# Patient Record
Sex: Female | Born: 1945 | Race: White | Hispanic: No
Health system: Southern US, Community
[De-identification: ages and names within clinical notes are randomized; demographics above are authoritative.]

## PROBLEM LIST (undated history)

## (undated) DIAGNOSIS — I2510 Atherosclerotic heart disease of native coronary artery without angina pectoris: Secondary | ICD-10-CM

## (undated) DIAGNOSIS — Z8719 Personal history of other diseases of the digestive system: Secondary | ICD-10-CM

## (undated) DIAGNOSIS — Z9289 Personal history of other medical treatment: Secondary | ICD-10-CM

## (undated) DIAGNOSIS — K219 Gastro-esophageal reflux disease without esophagitis: Secondary | ICD-10-CM

## (undated) DIAGNOSIS — D649 Anemia, unspecified: Secondary | ICD-10-CM

## (undated) DIAGNOSIS — I5032 Chronic diastolic (congestive) heart failure: Secondary | ICD-10-CM

## (undated) DIAGNOSIS — E785 Hyperlipidemia, unspecified: Secondary | ICD-10-CM

## (undated) DIAGNOSIS — J42 Unspecified chronic bronchitis: Secondary | ICD-10-CM

## (undated) DIAGNOSIS — M1990 Unspecified osteoarthritis, unspecified site: Secondary | ICD-10-CM

## (undated) DIAGNOSIS — Z952 Presence of prosthetic heart valve: Secondary | ICD-10-CM

## (undated) DIAGNOSIS — J45909 Unspecified asthma, uncomplicated: Secondary | ICD-10-CM

## (undated) DIAGNOSIS — J449 Chronic obstructive pulmonary disease, unspecified: Secondary | ICD-10-CM

## (undated) DIAGNOSIS — I050 Rheumatic mitral stenosis: Secondary | ICD-10-CM

## (undated) DIAGNOSIS — E559 Vitamin D deficiency, unspecified: Secondary | ICD-10-CM

## (undated) DIAGNOSIS — N184 Chronic kidney disease, stage 4 (severe): Secondary | ICD-10-CM

## (undated) DIAGNOSIS — E119 Type 2 diabetes mellitus without complications: Secondary | ICD-10-CM

## (undated) DIAGNOSIS — I10 Essential (primary) hypertension: Secondary | ICD-10-CM

## (undated) DIAGNOSIS — K829 Disease of gallbladder, unspecified: Secondary | ICD-10-CM

## (undated) DIAGNOSIS — I350 Nonrheumatic aortic (valve) stenosis: Secondary | ICD-10-CM

## (undated) HISTORY — DX: Vitamin D deficiency, unspecified: E55.9

## (undated) HISTORY — DX: Disease of gallbladder, unspecified: K82.9

## (undated) HISTORY — PX: BREAST CYST EXCISION: SHX579

## (undated) HISTORY — PX: CATARACT EXTRACTION W/ INTRAOCULAR LENS  IMPLANT, BILATERAL: SHX1307

## (undated) HISTORY — DX: Atherosclerotic heart disease of native coronary artery without angina pectoris: I25.10

## (undated) HISTORY — DX: Unspecified asthma, uncomplicated: J45.909

## (undated) HISTORY — PX: RETINAL LASER PROCEDURE: SHX2339

## (undated) HISTORY — DX: Hyperlipidemia, unspecified: E78.5

## (undated) HISTORY — PX: TONSILLECTOMY: SUR1361

## (undated) HISTORY — DX: Essential (primary) hypertension: I10

## (undated) HISTORY — DX: Nonrheumatic aortic (valve) stenosis: I35.0

## (undated) HISTORY — PX: CARDIAC CATHETERIZATION: SHX172

## (undated) HISTORY — PX: OTHER SURGICAL HISTORY: SHX169

## (undated) HISTORY — PX: PATELLA FRACTURE SURGERY: SHX735

## (undated) HISTORY — PX: FRACTURE SURGERY: SHX138

## (undated) HISTORY — DX: Chronic kidney disease, stage 4 (severe): N18.4

## (undated) HISTORY — DX: Anemia, unspecified: D64.9

---

## 2015-07-15 DIAGNOSIS — R011 Cardiac murmur, unspecified: Secondary | ICD-10-CM

## 2015-07-15 HISTORY — DX: Cardiac murmur, unspecified: R01.1

## 2015-08-27 DIAGNOSIS — N186 End stage renal disease: Secondary | ICD-10-CM | POA: Insufficient documentation

## 2016-10-03 DIAGNOSIS — K8050 Calculus of bile duct without cholangitis or cholecystitis without obstruction: Secondary | ICD-10-CM | POA: Insufficient documentation

## 2016-10-05 DIAGNOSIS — L89152 Pressure ulcer of sacral region, stage 2: Secondary | ICD-10-CM | POA: Insufficient documentation

## 2016-11-24 DIAGNOSIS — E11319 Type 2 diabetes mellitus with unspecified diabetic retinopathy without macular edema: Secondary | ICD-10-CM | POA: Insufficient documentation

## 2016-11-24 DIAGNOSIS — R2681 Unsteadiness on feet: Secondary | ICD-10-CM | POA: Insufficient documentation

## 2016-11-24 HISTORY — DX: Type 2 diabetes mellitus with unspecified diabetic retinopathy without macular edema: E11.319

## 2017-01-07 ENCOUNTER — Other Ambulatory Visit: Payer: Self-pay

## 2017-01-21 ENCOUNTER — Telehealth: Payer: Self-pay | Admitting: Cardiovascular Disease

## 2017-01-21 NOTE — Telephone Encounter (Signed)
New Message  Tammy pt family member called requesting to speak with RN. She states pt is in the hospital in [facility] and does not think the pt will be out by Monday. Tammy would like to further discuss with Lauren. Please call back

## 2017-01-24 ENCOUNTER — Ambulatory Visit: Payer: Self-pay | Admitting: Cardiovascular Disease

## 2017-01-24 NOTE — Telephone Encounter (Signed)
Left message on machine for Tammy to contact the office.

## 2017-01-24 NOTE — Telephone Encounter (Signed)
I spoke with Sonya Carpenter and the pt is hospitalized in [place] at this time. She went into the hospital last Thursday with SOB. The pt has a history of asthma and COPD and x-ray showed fluid vs pneumonia. The pt is being treated with antibiotic and Solu-medrol at this time.  Pulmonary will consult on the pt today.  The pt is using O2 4 L/min.  I have rescheduled the pt for next available TAVR consult on 10/12 with Dr Burt Knack.  Sonya Carpenter will contact the office if this appointment needs to be changed.

## 2017-01-24 NOTE — Progress Notes (Deleted)
Multi-Disciplinary Valve Clinic Note  Referring Provider: ***  History of Present Illness: 71 yo female with history of   Primary Care Physician: No primary care provider on file.  Primary cardiologist: ***  Past Medical History:  Diagnosis Date  . Anemia   . Aortic stenosis   . Aortic valve disorder   . Asthma   . CAD (coronary artery disease)   . CKD (chronic kidney disease), stage IV ([place])   . Edema   . Gallbladder disease   . Heart murmur   . Hyperlipidemia   . Hypertension   . Other seasonal allergic rhinitis   . SOB (shortness of breath)   . Vitamin D deficiency     Past Surgical History:  Procedure Laterality Date  . BREAST CYST EXCISION Right   . COLONSCOPY    . GALLBLADDER SURGERY    . KNEE SURGERY    . REFRACTIVE SURGERY    . TONSILLECTOMY      Current Outpatient Prescriptions  Medication Sig Dispense Refill  . albuterol (2.5 MG/3ML) 0.083% NEBU 3 mL, albuterol (5 MG/ML) 0.5% NEBU 0.5 mL Inhale 1 mg into the lungs every 6 (six) hours.    Marland Kitchen albuterol (PROVENTIL HFA;VENTOLIN HFA) 108 (90 Base) MCG/ACT inhaler Inhale into the lungs every 6 (six) hours as needed for wheezing or shortness of breath.    Marland Kitchen amLODipine (NORVASC) 5 MG tablet Take 5 mg by mouth daily.    Marland Kitchen aspirin EC 81 MG tablet Take 81 mg by mouth daily.    Marland Kitchen atorvastatin (LIPITOR) 40 MG tablet Take 40 mg by mouth daily.    . budesonide-formoterol (SYMBICORT) 160-4.5 MCG/ACT inhaler Inhale 2 puffs into the lungs 2 (two) times daily.    . cetirizine (ZYRTEC) 10 MG tablet Take 10 mg by mouth daily.    Marland Kitchen Dextran 70-Hypromellose (ARTIFICIAL TEARS) 0.1-0.3 % SOLN Apply to eye.    . DiphenhydrAMINE HCl (BENADRYL ALLERGY PO) Take by mouth.    . ferrous sulfate 325 (65 FE) MG EC tablet Take 325 mg by mouth daily.    . insulin lispro (HUMALOG) 100 UNIT/ML injection Inject 100 Units into the skin daily.    Marland Kitchen ipratropium (ATROVENT) 0.02 % nebulizer solution Take 0.5 mg by nebulization 2 (two) times  daily.    Marland Kitchen losartan (COZAAR) 100 MG tablet Take 100 mg by mouth daily.    Marland Kitchen lubiprostone (AMITIZA) 24 MCG capsule Take 24 mcg by mouth 2 (two) times daily with a meal.    . montelukast (SINGULAIR) 10 MG tablet Take 10 mg by mouth at bedtime.    . traMADol (ULTRAM) 50 MG tablet Take 50 mg by mouth every 6 (six) hours as needed.     No current facility-administered medications for this visit.     No Known Allergies  Social History   Social History  . Marital status: N/A    Spouse name: N/A  . Number of children: N/A  . Years of education: N/A   Occupational History  . Not on file.   Social History Main Topics  . Smoking status: Never Smoker  . Smokeless tobacco: Never Used  . Alcohol use Not on file  . Drug use: Unknown  . Sexual activity: Not on file   Other Topics Concern  . Not on file   Social History Narrative  . No narrative on file    Family History  Problem Relation Age of Onset  . Hypertension Mother   . Kidney failure Mother   .  Heart disease Mother   . Hypertension Father   . Heart disease Father   . Cancer Sister     Review of Systems:  As stated in the HPI and otherwise negative.   There were no vitals taken for this visit.  Physical Examination: General: Well developed, well nourished, NAD  HEENT: OP clear, mucus membranes moist  SKIN: warm, dry. No rashes. Neuro: No focal deficits  Musculoskeletal: Muscle strength 5/5 all ext  Psychiatric: Mood and affect normal  Neck: No JVD, no carotid bruits, no thyromegaly, no lymphadenopathy.  Lungs:Clear bilaterally, no wheezes, rhonci, crackles Cardiovascular: Regular rate and rhythm. *** Loud, harsh late peaking systolic murmur.  Abdomen:Soft. Bowel sounds present. Non-tender.  Extremities: No lower extremity edema. Pulses are 2 + in the bilateral DP/PT.  EKG:  EKG {ACTION; IS/IS GBE:01007121} ordered today. The ekg ordered today demonstrates ***  Recent Labs: No results found for requested labs  within last 8760 hours.   Lipid Panel No results found for: CHOL, TRIG, HDL, CHOLHDL, VLDL, LDLCALC, LDLDIRECT   Wt Readings from Last 3 Encounters:  No data found for Wt     Other studies Reviewed: Additional studies/ records that were reviewed today include: ***. Review of the above records demonstrates: ***  Assessment and Plan:   1. Severe aortic valve stenosis: *** has stage D symptomatic aortic valve stenosis. I have personally reviewed the echo images. The aortic valve is thickened, calcified with limited leaflet mobility. I think *** would benefit from AVR. Given advanced age, *** is not a good candidate for conventional AVR by surgical approach. I think *** may be a good candidate for TAVR. *** is a very functional *** yo patient. I have reviewed the TAVR procedure in detail today with the patient and her family. *** would like to proceed with planning for TAVR. I will arrange a right and left heart catheterization at [facility] ***. Risks and benefits of procedure reviewed with the patient. After the cath, *** will be referred to see one of the CT surgeons on our TAVR team. *** will then have Cardiac CT and CTA of the chest/abd/pelvis.   Current medicines are reviewed at length with the patient today.  The patient {ACTIONS; HAS/DOES NOT HAVE:19233} concerns regarding medicines.  The following changes have been made:  {PLAN; NO CHANGE:13088:s}  Labs/ tests ordered today include: *** No orders of the defined types were placed in this encounter.    Disposition:   FU with *** in {gen number [id number]} {TIME; UNITS DAY/WEEK/MONTH:19136}   Signed, Lauree Chandler, MD 01/24/2017 7:09 AM    [facility] [place], [place], [place]  [address] Phone: [phone]; Fax: [phone]

## 2017-02-04 ENCOUNTER — Ambulatory Visit (INDEPENDENT_AMBULATORY_CARE_PROVIDER_SITE_OTHER): Payer: Medicare HMO | Admitting: Cardiovascular Disease

## 2017-02-04 ENCOUNTER — Encounter: Payer: Self-pay | Admitting: Cardiovascular Disease

## 2017-02-04 ENCOUNTER — Encounter (INDEPENDENT_AMBULATORY_CARE_PROVIDER_SITE_OTHER): Payer: Self-pay

## 2017-02-04 VITALS — BP 136/60 | HR 72 | Ht <= 58 in | Wt 110.0 lb

## 2017-02-04 DIAGNOSIS — I5033 Acute on chronic diastolic (congestive) heart failure: Secondary | ICD-10-CM | POA: Diagnosis not present

## 2017-02-04 DIAGNOSIS — I350 Nonrheumatic aortic (valve) stenosis: Secondary | ICD-10-CM | POA: Diagnosis not present

## 2017-02-04 MED ORDER — FUROSEMIDE 40 MG PO TABS: 40.0000 mg | ORAL_TABLET | Freq: Every day | ORAL | 3 refills | Status: DC

## 2017-02-04 MED ORDER — POTASSIUM CHLORIDE ER 10 MEQ PO TBCR: 10.0000 meq | EXTENDED_RELEASE_TABLET | Freq: Every day | ORAL | 3 refills | Status: DC

## 2017-02-04 NOTE — Progress Notes (Signed)
Cardiology Office Note Date:  02/06/2017   ID:  Sonya Carpenter [birth year], MRN [id number]  PCP:  Sonya Heinrich, DO  Cardiologist:  Sonya Mocha, MD    Chief Complaint  Patient presents with  . New Patient (Initial Visit)    TAVR consult     History of Present Illness: Sonya Carpenter is a 71 y.o. female who presents for evaluation of severe aortic stenosis.   The patient is here with her first cousin and uncle today. She lives in [place], [place], widowed for one year. She has been followed by Dr Sonya Carpenter who is her cardiologist in [place]. She missed a few years of follow-up and was recently seen for pre-operative evaluation for gall bladder surgery.   Even though moderate aortic stenosis has been diagnosed in the past based on review of records, the patient doesn't recall any discussion about a murmur in the past.   She has had several medical problems over the years. She's been followed for chronic kidney disease stage 4. She's been to classes explaining dialysis in case her kidney disease progresses in the future. She does have a history of asthma since age 78. She's been hospitalized twice recently with pneumonia. Just released from the hospital last week. Reports that she had a right thoracentesis done during this admission. States she has had her 'lungs cleaned out' several times with what sounds like bronchoscopy based on her description. Does not recall receiving IV diuretics and in fact was told she was dehydrated during recent admissions.   She has previously been active. 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 She's not been able to drive a car or work since June of this year because of her health problems. She's become increasingly short of breath over the last 4 months. Now has to be dropped off at the front door when she goes anywhere because of shortness of breath with low level activity.   The patient has had diabetes for approximately 10 years  with both nephropathy and diabetic retinopathy. She has not had problems with neuropathy.   Past Medical History:  Diagnosis Date  . Anemia   . Aortic stenosis   . Aortic valve disorder   . Asthma   . CAD (coronary artery disease)   . CKD (chronic kidney disease), stage IV ([place])   . Edema   . Gallbladder disease   . Heart murmur   . Hyperlipidemia   . Hypertension   . Other seasonal allergic rhinitis   . SOB (shortness of breath)   . Vitamin D deficiency     Past Surgical History:  Procedure Laterality Date  . BREAST CYST EXCISION Right   . COLONSCOPY    . GALLBLADDER SURGERY    . KNEE SURGERY    . REFRACTIVE SURGERY    . TONSILLECTOMY      Current Outpatient Prescriptions  Medication Sig Dispense Refill  . albuterol (2.5 MG/3ML) 0.083% NEBU 3 mL, albuterol (5 MG/ML) 0.5% NEBU 0.5 mL Inhale 1 mg into the lungs every 6 (six) hours.    Marland Kitchen albuterol (PROVENTIL HFA;VENTOLIN HFA) 108 (90 Base) MCG/ACT inhaler Inhale into the lungs every 6 (six) hours as needed for wheezing or shortness of breath.    Marland Kitchen amLODipine (NORVASC) 5 MG tablet Take 5 mg by mouth daily.    Marland Kitchen aspirin EC 81 MG tablet Take 81 mg by mouth daily.    Marland Kitchen atorvastatin (LIPITOR) 20 MG tablet Take 20 mg by mouth daily.    Marland Kitchen  budesonide-formoterol (SYMBICORT) 160-4.5 MCG/ACT inhaler Inhale 2 puffs into the lungs 2 (two) times daily.    . cetirizine (ZYRTEC) 10 MG tablet Take 10 mg by mouth daily.    Marland Kitchen Dextran 70-Hypromellose (ARTIFICIAL TEARS) 0.1-0.3 % SOLN Place 1 drop into both eyes 2 (two) times daily.     . ferrous sulfate 325 (65 FE) MG EC tablet Take 325 mg by mouth 2 (two) times daily.     Marland Kitchen ipratropium (ATROVENT) 0.02 % nebulizer solution Take 0.5 mg by nebulization 2 (two) times daily.    Marland Kitchen LANTUS SOLOSTAR 100 UNIT/ML Solostar Pen Inject 20 Units into the skin daily.    Marland Kitchen losartan (COZAAR) 100 MG tablet Take 50 mg by mouth daily.     Marland Kitchen lubiprostone (AMITIZA) 8 MCG capsule Take 8 mcg by mouth daily.    .  montelukast (SINGULAIR) 10 MG tablet Take 10 mg by mouth at bedtime.    . sodium chloride (OCEAN) 0.65 % SOLN nasal spray Place 1 spray into both nostrils 2 (two) times daily.    . traMADol (ULTRAM) 50 MG tablet Take 50 mg by mouth every 6 (six) hours as needed (pain).     . furosemide (LASIX) 40 MG tablet Take 1 tablet (40 mg total) by mouth daily. 30 tablet 3  . potassium chloride (K-DUR) 10 MEQ tablet Take 1 tablet (10 mEq total) by mouth daily. 30 tablet 3   No current facility-administered medications for this visit.     Allergies:   No known allergies   Social History:  The patient  reports that she has never smoked. She has never used smokeless tobacco.   Family History:  The patient's family history includes Cancer in her sister; Heart disease in her father and mother; Hypertension in her father and mother; Kidney failure in her mother.   ROS:  Please see the history of present illness.  Otherwise, review of systems is positive for .  All other systems are reviewed and negative.   PHYSICAL EXAM: VS:  BP 136/60   Pulse 72   Ht 4\' 10"  (1.473 m)   Wt 110 lb (49.9 kg)   BMI 22.99 kg/m  , BMI Body mass index is 22.99 kg/m. GEN: Well nourished, well developed, elderly woman in no acute distress  HEENT: normal  Neck: no JVD, no masses. Delayed upstrokes with  carotid bruits Cardiac: RRR with 3/6 harsh late-peaking systolic murmur               Respiratory:  clear to auscultation bilaterally, normal work of breathing GI: soft, nontender, nondistended, + BS MS: no deformity or atrophy  Ext: 2+ bilateral pretibial edema Skin: warm and dry, no rash Neuro:  Strength and sensation are intact Psych: euthymic mood, full affect  EKG:  EKG is ordered today. The ekg ordered today shows Normal sinus rhythm 72 bpm, ST and T-wave abnormality consider inferior ischemia.  Recent Labs: 02/04/2017: BUN WILL FOLLOW; Creatinine, Ser WILL FOLLOW; Hemoglobin 12.7; Platelets 117; Potassium WILL  FOLLOW; Sodium WILL FOLLOW   Lipid Panel  No results found for: CHOL, TRIG, HDL, CHOLHDL, VLDL, LDLCALC, LDLDIRECT    Wt Readings from Last 3 Encounters:  02/04/17 110 lb (49.9 kg)     Cardiac Studies Reviewed: Echo report 12/08/2016: LVEF 60-65% with no wall motion abnormalities, concentric LVH with grade 2 diastolic dysfunction, severely calcified aortic valve with decreased cusp separation, severe aortic stenosis with mean gradient 69 L mercury and peak gradient 121 mmHg, calcification of  the mitral annulus with mild to moderate mitral stenosis.  STS RISK CALCULATOR Procedure: AV Replacement  Risk of Mortality: 15.185%  Morbidity or Mortality: 45.978%  Long Length of Stay: 26.977%  Short Length of Stay: 11.811%  Permanent Stroke: 2.284%  Prolonged Ventilation: 36.272%  DSW Infection: 0.206%  Renal Failure: 19.718%  Reoperation: 15.16%   ASSESSMENT AND PLAN: Severe, Stage D, Aortic stenosis. I have reviewed the natural history of aortic stenosis with the patient and their family members who are present today. We have discussed the limitations of medical therapy and the poor prognosis associated with symptomatic aortic stenosis. We have reviewed potential treatment options, including palliative medical therapy, conventional surgical aortic valve replacement, and transcatheter aortic valve replacement. We discussed treatment options in the context of the patient's specific comorbid medical conditions. Her predicted risk of mortality with conventional surgical AVR is 15% based primarily on comorbid medical conditions of Stage 4 CKD, insulin dependent diabetes, and moderate chronic lung disease.  I have personally reviewed the patient's echo images which of been loaded onto our system. She has vigorous LV function and heavy calcification of her aortic valve with what appears to be critical stenosis based on her hemodynamics. She does have calcification and thickening of the mitral valve  leaflets with moderate mitral stenosis as well.  I suspect the patient has critical aortic stenosis and acute on chronic diastolic heart failure likely playing a significant role in her recent hospitalizations and progressive dyspnea. Complicating matters are her relatively advanced kidney disease and diabetes. I think TAVR is certainly a reasonable treatment consideration that would offer her a lower risk of progressive kidney dysfunction and dialysis dependence compared with conventional heart surgery. She understands that without aortic valve replacement her symptoms of heart failure will likely progress. However, she understands that proceeding with TAVR and it's necessary workup will still involve significant risk in the context of her compromised renal function. She would like to proceed with further evaluation. We will plan on a limited dye diagnostic cardiac catheterization (right and left heart catheterization). I have reviewed the risks, indications, and alternatives to cardiac catheterization, possible angioplasty, and stenting with the patient. Risks include but are not limited to bleeding, infection, vascular injury, stroke, myocardial infection, arrhythmia, kidney injury, radiation-related injury in the case of prolonged fluoroscopy use, emergency cardiac surgery, and death. The patient understands the risks of serious complication is 1-2 in 5956 with diagnostic cardiac cath and 1-2% or less with angioplasty/stenting.  As long as she does not have severe coronary artery disease, would anticipate moving forward with CT angiography of the heart as well as the chest/abdomen/pelvis. Will use a limited contrast protocol in the context of her stage IV chronic kidney disease. After her studies are completed we'll refer her to cardiac surgery as part of a multidisciplinary approach to her evaluation.  On exam today, she has significant edema in her legs. I think she has volume overload related to diastolic  heart failure and severe aortic stenosis. Will start her on furosemide 40 mg daily and K-Dur 10 mEq daily. She will be scheduled for her catheterization next week and medications can be adjusted if needed at that time based on assessment of her hemodynamics.  Current medicines are reviewed with the patient today.  The patient does not have concerns regarding medicines.  Labs/ tests ordered today include:   Orders Placed This Encounter  Procedures  . Basic Metabolic Panel (BMET)  . CBC  . INR/PT   Disposition:  FU pending test results  Signed, Sonya Mocha, MD  02/06/2017 4:44 PM    [facility] [place], [place], [place]  [address] Phone: [phone]; Fax: [phone]

## 2017-02-04 NOTE — Patient Instructions (Addendum)
Medication Instructions:  Your physician has recommended you make the following change in your medication:  1. START Potassium Chloride 80mEq take one tablet by mouth daily 2. START Furosemide 40mg  take one tablet by mouth daily  Labwork: Your physician recommends that you have lab work today: BMP, CBC and PT/INR  Testing/Procedures: Your physician has requested that you have a cardiac catheterization. Cardiac catheterization is used to diagnose and/or treat various heart conditions. Doctors may recommend this procedure for a number of different reasons. The most common reason is to evaluate chest pain. Chest pain can be a symptom of coronary artery disease (CAD), and cardiac catheterization can show whether plaque is narrowing or blocking your heart's arteries. This procedure is also used to evaluate the valves, as well as measure the blood flow and oxygen levels in different parts of your heart. For further information please visit HugeFiesta.tn. Please follow instruction sheet, as given.    [facility] [address], [place] [address] Dept: [place]: [place]  02/04/2017  You are scheduled for a Cardiac Catheterization on Wednesday, October 17 with Dr. Lauree Chandler.  1. Please arrive at the [facility] (Main Entrance A) at [facility]: [address] [place], [place] [address] at 11:00 AM (two hours before your procedure to ensure your preparation). Free valet parking service is available.   Special note: Every effort is made to have your procedure done on time. Please understand that emergencies sometimes delay scheduled procedures.  2. Diet: Do not eat or drink anything after midnight prior to your procedure except sips of water to take medications.  3. Labs: None needed.  4. Medication instructions in preparation for your  procedure:  Do NOT take Insulin and Furosemide (Lasix) the morning of procedure.  On the morning of your procedure, take your Aspirin and any morning medicines NOT listed above.  You may use sips of water.  5. Plan for one night stay--bring personal belongings.  6. Bring a current list of your medications and current insurance cards.  7. You MUST have a responsible person to drive you home.  8. Someone MUST be with you the first 24 hours after you arrive home or your discharge will be delayed.  9. Please wear clothes that are easy to get on and off and wear slip-on shoes.  Thank you for allowing [place] to care for you!   -- [facility]      Your physician has requested that you have cardiac CT. Cardiac computed tomography (CT) is a painless test that uses an x-ray machine to take clear, detailed pictures of your heart. For further information please visit HugeFiesta.tn. Please follow instruction sheet as given.   Non-Cardiac CT Angiography (CTA), is a special type of CT scan that uses a computer to produce multi-dimensional views of major blood vessels throughout the body. In CT angiography, a contrast material is injected through an IV to help visualize the blood vessels (CTA Chest/Abdomen and Pelvis)  Pre-CT instructions: 1. No solid foods, No caffeine or smoking 4 hours prior to CT. You can have liquids but no caffeine. 2. No herbal supplements by mouth 24 hours prior to CT. 3. No sexual enhancement drugs/herbs 72 hours prior to CT.   Your physician has recommended that you have a pulmonary function test. Pulmonary Function Tests are a group of tests that measure how well air moves in  and out of your lungs. DO NOT use inhalers the day of test.   Your physician has requested that you have a carotid duplex. This test is an ultrasound of the carotid arteries in your neck. It looks at blood flow through these arteries that supply the brain with  blood. Allow one hour for this exam. There are no restrictions or special instructions.  Your physician has requested Outpatient Physical Therapy evaluation.  This will be done at [facility] at [place], [place] [address]  Follow-Up: You have been referred to Dr Roxy Manns and Dr Cyndia Bent at [facility] for further TAVR evaluation.  [place], [id number], [place],  [id number]   Any Other Special Instructions Will Be Listed Below (If Applicable).     If you need a refill on your cardiac medications before your next appointment, please call your pharmacy.

## 2017-02-07 ENCOUNTER — Other Ambulatory Visit: Payer: Self-pay

## 2017-02-07 ENCOUNTER — Telehealth: Payer: Self-pay | Admitting: Cardiovascular Disease

## 2017-02-07 DIAGNOSIS — I350 Nonrheumatic aortic (valve) stenosis: Secondary | ICD-10-CM

## 2017-02-07 NOTE — Telephone Encounter (Signed)
Records received From [facility]. Placed In Dr.Cooper Doc Box.

## 2017-02-08 ENCOUNTER — Telehealth: Payer: Self-pay

## 2017-02-08 LAB — BASIC METABOLIC PANEL
BUN/Creatinine Ratio: 32 — ABNORMAL HIGH (ref 12–28)
BUN: 51 mg/dL — AB (ref 8–27)
CALCIUM: 9.5 mg/dL (ref 8.7–10.3)
CHLORIDE: 106 mmol/L (ref 96–106)
CO2: 21 mmol/L (ref 20–29)
Creatinine, Ser: 1.59 mg/dL — ABNORMAL HIGH (ref 0.57–1.00)
GFR calc non Af Amer: 33 mL/min/{1.73_m2} — ABNORMAL LOW (ref >59)
GFR, EST AFRICAN AMERICAN: 38 mL/min/{1.73_m2} — AB (ref >59)
Glucose: 271 mg/dL — ABNORMAL HIGH (ref 65–99)
POTASSIUM: 4.5 mmol/L (ref 3.5–5.2)
Sodium: 142 mmol/L (ref 134–144)

## 2017-02-08 LAB — CBC
HEMATOCRIT: 39.6 % (ref 34.0–46.6)
HEMOGLOBIN: 12.7 g/dL (ref 11.1–15.9)
MCH: 29.5 pg (ref 26.6–33.0)
MCHC: 32.1 g/dL (ref 31.5–35.7)
MCV: 92 fL (ref 79–97)
Platelets: 117 10*3/uL — ABNORMAL LOW (ref 150–379)
RBC: 4.3 x10E6/uL (ref 3.77–5.28)
RDW: 14.8 % (ref 12.3–15.4)
WBC: 6.8 10*3/uL (ref 3.4–10.8)

## 2017-02-08 LAB — PROTIME-INR
INR: 1.1 (ref 0.8–1.2)
PROTHROMBIN TIME: 11.4 s (ref 9.1–12.0)

## 2017-02-08 NOTE — Telephone Encounter (Signed)
Patient emergency contact contacted pre-catheterization at [facility] scheduled for:  02/09/2017 @ 1300 Verified arrival time and place:  NT @ 1100 Confirmed AM meds to be taken pre-cath with sip of water: Take ASA Hold lasix, insulin Confirmed patient has responsible person to drive home post procedure and observe patient for 24 hours:  yes Addl concerns:   Pt with stage 4 CKD, Cr drawn on Friday 1.59.   Will notify interventionalist and follow.

## 2017-02-08 NOTE — Telephone Encounter (Signed)
Per Dr. Burt Knack, does not want Pt to have extra fluids d/t recent hospitalization for CHF.  Notified Tammy to arrive as scheduled, will repeat BMP on arrival and Dr. Angelena Form will manage Pt from there.  Tammy indicates understanding, thanked for calling back.  Entered BMP to be released upon arrival in short stay.

## 2017-02-09 ENCOUNTER — Encounter (HOSPITAL_COMMUNITY)
Admission: RE | Disposition: A | Discharge status: Home / Self Care | Payer: Self-pay | Source: Ambulatory Visit | Attending: Cardiovascular Disease

## 2017-02-09 ENCOUNTER — Ambulatory Visit (HOSPITAL_COMMUNITY)
Admission: RE | Admit: 2017-02-09 | Discharge: 2017-02-09 | Disposition: A | Discharge status: Home / Self Care | Payer: Medicare HMO | Source: Ambulatory Visit | Attending: Cardiovascular Disease | Admitting: Cardiovascular Disease

## 2017-02-09 ENCOUNTER — Other Ambulatory Visit: Payer: Self-pay

## 2017-02-09 DIAGNOSIS — D631 Anemia in chronic kidney disease: Secondary | ICD-10-CM | POA: Insufficient documentation

## 2017-02-09 DIAGNOSIS — N184 Chronic kidney disease, stage 4 (severe): Secondary | ICD-10-CM | POA: Diagnosis not present

## 2017-02-09 DIAGNOSIS — I350 Nonrheumatic aortic (valve) stenosis: Secondary | ICD-10-CM

## 2017-02-09 DIAGNOSIS — E1121 Type 2 diabetes mellitus with diabetic nephropathy: Secondary | ICD-10-CM | POA: Insufficient documentation

## 2017-02-09 DIAGNOSIS — E1122 Type 2 diabetes mellitus with diabetic chronic kidney disease: Secondary | ICD-10-CM | POA: Diagnosis not present

## 2017-02-09 DIAGNOSIS — Z841 Family history of disorders of kidney and ureter: Secondary | ICD-10-CM | POA: Insufficient documentation

## 2017-02-09 DIAGNOSIS — I2510 Atherosclerotic heart disease of native coronary artery without angina pectoris: Secondary | ICD-10-CM | POA: Diagnosis not present

## 2017-02-09 DIAGNOSIS — Z794 Long term (current) use of insulin: Secondary | ICD-10-CM | POA: Diagnosis not present

## 2017-02-09 DIAGNOSIS — Z8249 Family history of ischemic heart disease and other diseases of the circulatory system: Secondary | ICD-10-CM | POA: Insufficient documentation

## 2017-02-09 DIAGNOSIS — J45909 Unspecified asthma, uncomplicated: Secondary | ICD-10-CM | POA: Diagnosis not present

## 2017-02-09 DIAGNOSIS — I129 Hypertensive chronic kidney disease with stage 1 through stage 4 chronic kidney disease, or unspecified chronic kidney disease: Secondary | ICD-10-CM | POA: Diagnosis not present

## 2017-02-09 DIAGNOSIS — E785 Hyperlipidemia, unspecified: Secondary | ICD-10-CM | POA: Insufficient documentation

## 2017-02-09 DIAGNOSIS — E559 Vitamin D deficiency, unspecified: Secondary | ICD-10-CM | POA: Diagnosis not present

## 2017-02-09 DIAGNOSIS — Z7982 Long term (current) use of aspirin: Secondary | ICD-10-CM | POA: Insufficient documentation

## 2017-02-09 DIAGNOSIS — E11319 Type 2 diabetes mellitus with unspecified diabetic retinopathy without macular edema: Secondary | ICD-10-CM | POA: Diagnosis not present

## 2017-02-09 DIAGNOSIS — Z9049 Acquired absence of other specified parts of digestive tract: Secondary | ICD-10-CM | POA: Insufficient documentation

## 2017-02-09 DIAGNOSIS — I080 Rheumatic disorders of both mitral and aortic valves: Secondary | ICD-10-CM | POA: Diagnosis not present

## 2017-02-09 DIAGNOSIS — Z809 Family history of malignant neoplasm, unspecified: Secondary | ICD-10-CM | POA: Diagnosis not present

## 2017-02-09 HISTORY — PX: RIGHT/LEFT HEART CATH AND CORONARY ANGIOGRAPHY: CATH118266

## 2017-02-09 LAB — GLUCOSE, CAPILLARY: Glucose-Capillary: 143 mg/dL — ABNORMAL HIGH (ref 65–99)

## 2017-02-09 LAB — BASIC METABOLIC PANEL
ANION GAP: 7 (ref 5–15)
BUN: 49 mg/dL — ABNORMAL HIGH (ref 6–20)
CALCIUM: 9.1 mg/dL (ref 8.9–10.3)
CHLORIDE: 107 mmol/L (ref 101–111)
CO2: 24 mmol/L (ref 22–32)
Creatinine, Ser: 1.86 mg/dL — ABNORMAL HIGH (ref 0.44–1.00)
GFR calc non Af Amer: 26 mL/min — ABNORMAL LOW (ref >60)
GFR, EST AFRICAN AMERICAN: 31 mL/min — AB (ref >60)
GLUCOSE: 149 mg/dL — AB (ref 65–99)
POTASSIUM: 3.9 mmol/L (ref 3.5–5.1)
Sodium: 138 mmol/L (ref 135–145)

## 2017-02-09 LAB — POCT I-STAT 3, VENOUS BLOOD GAS (G3P V)
Acid-base deficit: 1 mmol/L (ref 0.0–2.0)
Bicarbonate: 24.5 mmol/L (ref 20.0–28.0)
O2 SAT: 78 %
PCO2 VEN: 41.4 mmHg — AB (ref 44.0–60.0)
PO2 VEN: 43 mmHg (ref 32.0–45.0)
TCO2: 26 mmol/L (ref 22–32)
pH, Ven: 7.379 (ref 7.250–7.430)

## 2017-02-09 LAB — POCT I-STAT 3, ART BLOOD GAS (G3+)
Bicarbonate: 24.8 mmol/L (ref 20.0–28.0)
O2 Saturation: 99 %
PCO2 ART: 38.4 mmHg (ref 32.0–48.0)
PO2 ART: 132 mmHg — AB (ref 83.0–108.0)
TCO2: 26 mmol/L (ref 22–32)
pH, Arterial: 7.419 (ref 7.350–7.450)

## 2017-02-09 SURGERY — RIGHT/LEFT HEART CATH AND CORONARY ANGIOGRAPHY
Anesthesia: LOCAL

## 2017-02-09 MED ORDER — SODIUM CHLORIDE 0.9% FLUSH: 3.0000 mL | Freq: Two times a day (BID) | INTRAVENOUS | Status: DC

## 2017-02-09 MED ORDER — HEPARIN SODIUM (PORCINE) 1000 UNIT/ML IJ SOLN
INTRAMUSCULAR | Status: AC
  Filled 2017-02-09: qty 1

## 2017-02-09 MED ORDER — FENTANYL CITRATE (PF) 100 MCG/2ML IJ SOLN
INTRAMUSCULAR | Status: DC | PRN
  Administered 2017-02-09: 25 ug via INTRAVENOUS

## 2017-02-09 MED ORDER — HEPARIN (PORCINE) IN NACL 2-0.9 UNIT/ML-% IJ SOLN
INTRAMUSCULAR | Status: AC | PRN
  Administered 2017-02-09: 1000 mL

## 2017-02-09 MED ORDER — LIDOCAINE HCL 2 % IJ SOLN
INTRAMUSCULAR | Status: AC
  Filled 2017-02-09: qty 10

## 2017-02-09 MED ORDER — LIDOCAINE HCL (PF) 1 % IJ SOLN
INTRAMUSCULAR | Status: DC | PRN
  Administered 2017-02-09 (×2): 2 mL via SUBCUTANEOUS

## 2017-02-09 MED ORDER — SODIUM CHLORIDE 0.9% FLUSH: 3.0000 mL | INTRAVENOUS | Status: DC | PRN

## 2017-02-09 MED ORDER — SODIUM CHLORIDE 0.9 % IV SOLN: 250.0000 mL | INTRAVENOUS | Status: DC | PRN

## 2017-02-09 MED ORDER — SODIUM CHLORIDE 0.9 % WEIGHT BASED INFUSION: 1.0000 mL/kg/h | INTRAVENOUS | Status: DC

## 2017-02-09 MED ORDER — IOPAMIDOL (ISOVUE-370) INJECTION 76%
INTRAVENOUS | Status: DC | PRN
  Administered 2017-02-09: 60 mL via INTRA_ARTERIAL

## 2017-02-09 MED ORDER — IOPAMIDOL (ISOVUE-370) INJECTION 76%
INTRAVENOUS | Status: AC
  Filled 2017-02-09: qty 100

## 2017-02-09 MED ORDER — FENTANYL CITRATE (PF) 100 MCG/2ML IJ SOLN
INTRAMUSCULAR | Status: AC
  Filled 2017-02-09: qty 2

## 2017-02-09 MED ORDER — SODIUM CHLORIDE 0.9 % WEIGHT BASED INFUSION
3.0000 mL/kg/h | INTRAVENOUS | Status: AC
  Administered 2017-02-09: 3 mL/kg/h via INTRAVENOUS

## 2017-02-09 MED ORDER — MIDAZOLAM HCL 2 MG/2ML IJ SOLN
INTRAMUSCULAR | Status: AC
  Filled 2017-02-09: qty 2

## 2017-02-09 MED ORDER — VERAPAMIL HCL 2.5 MG/ML IV SOLN
INTRAVENOUS | Status: DC | PRN
  Administered 2017-02-09: 10 mL via INTRA_ARTERIAL

## 2017-02-09 MED ORDER — ASPIRIN 81 MG PO CHEW: 81.0000 mg | CHEWABLE_TABLET | ORAL | Status: DC

## 2017-02-09 MED ORDER — VERAPAMIL HCL 2.5 MG/ML IV SOLN
INTRAVENOUS | Status: AC
  Filled 2017-02-09: qty 2

## 2017-02-09 MED ORDER — MIDAZOLAM HCL 2 MG/2ML IJ SOLN
INTRAMUSCULAR | Status: DC | PRN
  Administered 2017-02-09: 1 mg via INTRAVENOUS

## 2017-02-09 MED ORDER — HEPARIN (PORCINE) IN NACL 2-0.9 UNIT/ML-% IJ SOLN
INTRAMUSCULAR | Status: AC
  Filled 2017-02-09: qty 1000

## 2017-02-09 MED ORDER — SODIUM CHLORIDE 0.9 % IV SOLN: INTRAVENOUS | Status: DC

## 2017-02-09 MED ORDER — HEPARIN SODIUM (PORCINE) 1000 UNIT/ML IJ SOLN
INTRAMUSCULAR | Status: DC | PRN
  Administered 2017-02-09: 3000 [IU] via INTRAVENOUS

## 2017-02-09 SURGICAL SUPPLY — 14 items
CATH BALLN WEDGE 5F 110CM (CATHETERS) ×2 IMPLANT
CATH IMPULSE 5F ANG/FL3.5 (CATHETERS) ×2 IMPLANT
CATH INFINITI 5FR AL1 (CATHETERS) ×2 IMPLANT
DEVICE RAD COMP TR BAND LRG (VASCULAR PRODUCTS) ×2 IMPLANT
GLIDESHEATH SLEND SS 6F .021 (SHEATH) ×2 IMPLANT
GUIDEWIRE INQWIRE 1.5J.035X260 (WIRE) ×1 IMPLANT
INQWIRE 1.5J .035X260CM (WIRE) ×2
KIT HEART LEFT (KITS) ×2 IMPLANT
PACK CARDIAC CATHETERIZATION (CUSTOM PROCEDURE TRAY) ×2 IMPLANT
SHEATH GLIDE SLENDER 4/5FR (SHEATH) ×2 IMPLANT
TRANSDUCER W/STOPCOCK (MISCELLANEOUS) ×2 IMPLANT
TUBING CIL FLEX 10 FLL-RA (TUBING) ×2 IMPLANT
WIRE EMERALD ST .035X150CM (WIRE) ×2 IMPLANT
WIRE HI TORQ VERSACORE-J 145CM (WIRE) ×2 IMPLANT

## 2017-02-09 NOTE — Interval H&P Note (Signed)
History and Physical Interval Note:  02/09/2017 11:57 AM  Sonya Carpenter  has presented today for cardiac cath with the diagnosis of severe aortic stenosis  The various methods of treatment have been discussed with the patient and family. After consideration of risks, benefits and other options for treatment, the patient has consented to  Procedure(s): RIGHT/LEFT HEART CATH AND CORONARY ANGIOGRAPHY (N/A) as a surgical intervention .  The patient's history has been reviewed, patient examined, no change in status, stable for surgery.  I have reviewed the patient's chart and labs.  Questions were answered to the patient's satisfaction.    Cath Lab Visit (complete for each Cath Lab visit)  Clinical Evaluation Leading to the Procedure:   ACS: No.  Non-ACS:    Anginal Classification: CCS II  Anti-ischemic medical therapy: Minimal Therapy (1 class of medications)  Non-Invasive Test Results: No non-invasive testing performed  Prior CABG: No previous CABG         Lauree Chandler

## 2017-02-09 NOTE — Progress Notes (Signed)
Site area: rt ac venous sheath Site Prior to Removal:  Level 0 Pressure Applied For:  10 minutes Manual:   yes Patient Status During Pull:  stable Post Pull Site:  Level  0 Post Pull Instructions Given:  yes Post Pull Pulses Present: palpable radial Dressing Applied:  Gauze and tegaderm Bedrest begins @ 7680 Comments:

## 2017-02-09 NOTE — Discharge Instructions (Signed)

## 2017-02-09 NOTE — H&P (View-Only) (Signed)
Cardiology Office Note Date:  02/06/2017   ID:  Sonya Carpenter, Sonya Carpenter [birth year], MRN [id number]  PCP:  Michell Heinrich, DO  Cardiologist:  [name], MD    Chief Complaint  Patient presents with  . New Patient (Initial Visit)    TAVR consult     History of Present Illness: Sonya Carpenter is a 71 y.o. female who presents for evaluation of severe aortic stenosis.   The patient is here with her first cousin and uncle today. She lives in [place], [place], widowed for one year. She has been followed by Dr Alroy Dust who is her cardiologist in [place]. She missed a few years of follow-up and was recently seen for pre-operative evaluation for gall bladder surgery.   Even though moderate aortic stenosis has been diagnosed in the past based on review of records, the patient doesn't recall any discussion about a murmur in the past.   She has had several medical problems over the years. She's been followed for chronic kidney disease stage 4. She's been to classes explaining dialysis in case her kidney disease progresses in the future. She does have a history of asthma since age 79. She's been hospitalized twice recently with pneumonia. Just released from the hospital last week. Reports that she had a right thoracentesis done during this admission. States she has had her 'lungs cleaned out' several times with what sounds like bronchoscopy based on her description. Does not recall receiving IV diuretics and in fact was told she was dehydrated during recent admissions.   She has previously been active. 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 She's not been able to drive a car or work since June of this year because of her health problems. She's become increasingly short of breath over the last 4 months. Now has to be dropped off at the front door when she goes anywhere because of shortness of breath with low level activity.   The patient has had diabetes for approximately 10 years  with both nephropathy and diabetic retinopathy. She has not had problems with neuropathy.   Past Medical History:  Diagnosis Date  . Anemia   . Aortic stenosis   . Aortic valve disorder   . Asthma   . CAD (coronary artery disease)   . CKD (chronic kidney disease), stage IV ([place])   . Edema   . Gallbladder disease   . Heart murmur   . Hyperlipidemia   . Hypertension   . Other seasonal allergic rhinitis   . SOB (shortness of breath)   . Vitamin D deficiency     Past Surgical History:  Procedure Laterality Date  . BREAST CYST EXCISION Right   . COLONSCOPY    . GALLBLADDER SURGERY    . KNEE SURGERY    . REFRACTIVE SURGERY    . TONSILLECTOMY      Current Outpatient Prescriptions  Medication Sig Dispense Refill  . albuterol (2.5 MG/3ML) 0.083% NEBU 3 mL, albuterol (5 MG/ML) 0.5% NEBU 0.5 mL Inhale 1 mg into the lungs every 6 (six) hours.    Marland Kitchen albuterol (PROVENTIL HFA;VENTOLIN HFA) 108 (90 Base) MCG/ACT inhaler Inhale into the lungs every 6 (six) hours as needed for wheezing or shortness of breath.    Marland Kitchen amLODipine (NORVASC) 5 MG tablet Take 5 mg by mouth daily.    Marland Kitchen aspirin EC 81 MG tablet Take 81 mg by mouth daily.    Marland Kitchen atorvastatin (LIPITOR) 20 MG tablet Take 20 mg by mouth daily.    Marland Kitchen  budesonide-formoterol (SYMBICORT) 160-4.5 MCG/ACT inhaler Inhale 2 puffs into the lungs 2 (two) times daily.    . cetirizine (ZYRTEC) 10 MG tablet Take 10 mg by mouth daily.    Marland Kitchen Dextran 70-Hypromellose (ARTIFICIAL TEARS) 0.1-0.3 % SOLN Place 1 drop into both eyes 2 (two) times daily.     . ferrous sulfate 325 (65 FE) MG EC tablet Take 325 mg by mouth 2 (two) times daily.     Marland Kitchen ipratropium (ATROVENT) 0.02 % nebulizer solution Take 0.5 mg by nebulization 2 (two) times daily.    Marland Kitchen LANTUS SOLOSTAR 100 UNIT/ML Solostar Pen Inject 20 Units into the skin daily.    Marland Kitchen losartan (COZAAR) 100 MG tablet Take 50 mg by mouth daily.     Marland Kitchen lubiprostone (AMITIZA) 8 MCG capsule Take 8 mcg by mouth daily.    .  montelukast (SINGULAIR) 10 MG tablet Take 10 mg by mouth at bedtime.    . sodium chloride (OCEAN) 0.65 % SOLN nasal spray Place 1 spray into both nostrils 2 (two) times daily.    . traMADol (ULTRAM) 50 MG tablet Take 50 mg by mouth every 6 (six) hours as needed (pain).     . furosemide (LASIX) 40 MG tablet Take 1 tablet (40 mg total) by mouth daily. 30 tablet 3  . potassium chloride (K-DUR) 10 MEQ tablet Take 1 tablet (10 mEq total) by mouth daily. 30 tablet 3   No current facility-administered medications for this visit.     Allergies:   No known allergies   Social History:  The patient  reports that she has never smoked. She has never used smokeless tobacco.   Family History:  The patient's family history includes Cancer in her sister; Heart disease in her father and mother; Hypertension in her father and mother; Kidney failure in her mother.   ROS:  Please see the history of present illness.  Otherwise, review of systems is positive for .  All other systems are reviewed and negative.   PHYSICAL EXAM: VS:  BP 136/60   Pulse 72   Ht 4\' 10"  (1.473 m)   Wt 110 lb (49.9 kg)   BMI 22.99 kg/m  , BMI Body mass index is 22.99 kg/m. GEN: Well nourished, well developed, elderly woman in no acute distress  HEENT: normal  Neck: no JVD, no masses. Delayed upstrokes with  carotid bruits Cardiac: RRR with 3/6 harsh late-peaking systolic murmur               Respiratory:  clear to auscultation bilaterally, normal work of breathing GI: soft, nontender, nondistended, + BS MS: no deformity or atrophy  Ext: 2+ bilateral pretibial edema Skin: warm and dry, no rash Neuro:  Strength and sensation are intact Psych: euthymic mood, full affect  EKG:  EKG is ordered today. The ekg ordered today shows Normal sinus rhythm 72 bpm, ST and T-wave abnormality consider inferior ischemia.  Recent Labs: 02/04/2017: BUN WILL FOLLOW; Creatinine, Ser WILL FOLLOW; Hemoglobin 12.7; Platelets 117; Potassium WILL  FOLLOW; Sodium WILL FOLLOW   Lipid Panel  No results found for: CHOL, TRIG, HDL, CHOLHDL, VLDL, LDLCALC, LDLDIRECT    Wt Readings from Last 3 Encounters:  02/04/17 110 lb (49.9 kg)     Cardiac Studies Reviewed: Echo report 12/08/2016: LVEF 60-65% with no wall motion abnormalities, concentric LVH with grade 2 diastolic dysfunction, severely calcified aortic valve with decreased cusp separation, severe aortic stenosis with mean gradient 69 L mercury and peak gradient 121 mmHg, calcification of  the mitral annulus with mild to moderate mitral stenosis.  STS RISK CALCULATOR Procedure: AV Replacement  Risk of Mortality: 15.185%  Morbidity or Mortality: 45.978%  Long Length of Stay: 26.977%  Short Length of Stay: 11.811%  Permanent Stroke: 2.284%  Prolonged Ventilation: 36.272%  DSW Infection: 0.206%  Renal Failure: 19.718%  Reoperation: 15.16%   ASSESSMENT AND PLAN: Severe, Stage D, Aortic stenosis. I have reviewed the natural history of aortic stenosis with the patient and their family members who are present today. We have discussed the limitations of medical therapy and the poor prognosis associated with symptomatic aortic stenosis. We have reviewed potential treatment options, including palliative medical therapy, conventional surgical aortic valve replacement, and transcatheter aortic valve replacement. We discussed treatment options in the context of the patient's specific comorbid medical conditions. Her predicted risk of mortality with conventional surgical AVR is 15% based primarily on comorbid medical conditions of Stage 4 CKD, insulin dependent diabetes, and moderate chronic lung disease.  I have personally reviewed the patient's echo images which of been loaded onto our system. She has vigorous LV function and heavy calcification of her aortic valve with what appears to be critical stenosis based on her hemodynamics. She does have calcification and thickening of the mitral valve  leaflets with moderate mitral stenosis as well.  I suspect the patient has critical aortic stenosis and acute on chronic diastolic heart failure likely playing a significant role in her recent hospitalizations and progressive dyspnea. Complicating matters are her relatively advanced kidney disease and diabetes. I think TAVR is certainly a reasonable treatment consideration that would offer her a lower risk of progressive kidney dysfunction and dialysis dependence compared with conventional heart surgery. She understands that without aortic valve replacement her symptoms of heart failure will likely progress. However, she understands that proceeding with TAVR and it's necessary workup will still involve significant risk in the context of her compromised renal function. She would like to proceed with further evaluation. We will plan on a limited dye diagnostic cardiac catheterization (right and left heart catheterization). I have reviewed the risks, indications, and alternatives to cardiac catheterization, possible angioplasty, and stenting with the patient. Risks include but are not limited to bleeding, infection, vascular injury, stroke, myocardial infection, arrhythmia, kidney injury, radiation-related injury in the case of prolonged fluoroscopy use, emergency cardiac surgery, and death. The patient understands the risks of serious complication is 1-2 in 4536 with diagnostic cardiac cath and 1-2% or less with angioplasty/stenting.  As long as she does not have severe coronary artery disease, would anticipate moving forward with CT angiography of the heart as well as the chest/abdomen/pelvis. Will use a limited contrast protocol in the context of her stage IV chronic kidney disease. After her studies are completed we'll refer her to cardiac surgery as part of a multidisciplinary approach to her evaluation.  On exam today, she has significant edema in her legs. I think she has volume overload related to diastolic  heart failure and severe aortic stenosis. Will start her on furosemide 40 mg daily and K-Dur 10 mEq daily. She will be scheduled for her catheterization next week and medications can be adjusted if needed at that time based on assessment of her hemodynamics.  Current medicines are reviewed with the patient today.  The patient does not have concerns regarding medicines.  Labs/ tests ordered today include:   Orders Placed This Encounter  Procedures  . Basic Metabolic Panel (BMET)  . CBC  . INR/PT   Disposition:  FU pending test results  Signed, [name], MD  02/06/2017 4:44 PM    [facility] [place], [place], [place]  [address] Phone: [phone]; Fax: [phone]

## 2017-02-10 ENCOUNTER — Other Ambulatory Visit: Payer: Self-pay

## 2017-02-10 ENCOUNTER — Encounter (HOSPITAL_COMMUNITY): Payer: Self-pay | Admitting: Cardiovascular Disease

## 2017-02-10 DIAGNOSIS — N289 Disorder of kidney and ureter, unspecified: Secondary | ICD-10-CM

## 2017-02-10 DIAGNOSIS — I350 Nonrheumatic aortic (valve) stenosis: Secondary | ICD-10-CM

## 2017-03-01 ENCOUNTER — Other Ambulatory Visit (HOSPITAL_COMMUNITY): Payer: Medicare HMO

## 2017-03-01 ENCOUNTER — Ambulatory Visit (HOSPITAL_COMMUNITY)
Admit: 2017-03-01 | Discharge: 2017-03-01 | Disposition: A | Discharge status: Home / Self Care | Payer: Medicare HMO | Attending: Cardiovascular Disease | Admitting: Cardiovascular Disease

## 2017-03-01 ENCOUNTER — Ambulatory Visit (HOSPITAL_BASED_OUTPATIENT_CLINIC_OR_DEPARTMENT_OTHER)
Admit: 2017-03-01 | Discharge: 2017-03-01 | Disposition: A | Discharge status: Home / Self Care | Payer: Medicare HMO | Attending: Cardiovascular Disease | Admitting: Cardiovascular Disease

## 2017-03-01 ENCOUNTER — Encounter (HOSPITAL_COMMUNITY): Payer: Self-pay

## 2017-03-01 ENCOUNTER — Encounter (HOSPITAL_COMMUNITY): Payer: Medicare HMO

## 2017-03-01 DIAGNOSIS — N289 Disorder of kidney and ureter, unspecified: Secondary | ICD-10-CM | POA: Diagnosis present

## 2017-03-01 DIAGNOSIS — I6523 Occlusion and stenosis of bilateral carotid arteries: Secondary | ICD-10-CM | POA: Insufficient documentation

## 2017-03-01 DIAGNOSIS — I2510 Atherosclerotic heart disease of native coronary artery without angina pectoris: Secondary | ICD-10-CM | POA: Diagnosis not present

## 2017-03-01 DIAGNOSIS — I700 Atherosclerosis of aorta: Secondary | ICD-10-CM | POA: Insufficient documentation

## 2017-03-01 DIAGNOSIS — K838 Other specified diseases of biliary tract: Secondary | ICD-10-CM | POA: Diagnosis not present

## 2017-03-01 DIAGNOSIS — I350 Nonrheumatic aortic (valve) stenosis: Secondary | ICD-10-CM

## 2017-03-01 DIAGNOSIS — I517 Cardiomegaly: Secondary | ICD-10-CM | POA: Insufficient documentation

## 2017-03-01 DIAGNOSIS — E279 Disorder of adrenal gland, unspecified: Secondary | ICD-10-CM | POA: Diagnosis not present

## 2017-03-01 MED ORDER — IOPAMIDOL (ISOVUE-370) INJECTION 76%
INTRAVENOUS | Status: AC
  Administered 2017-03-01: 80 mL
  Filled 2017-03-01: qty 100

## 2017-03-01 MED ORDER — SODIUM BICARBONATE 8.4 % IV SOLN
INTRAVENOUS | Status: AC
  Administered 2017-03-01: 13:00:00 via INTRAVENOUS
  Filled 2017-03-01: qty 500

## 2017-03-01 MED ORDER — SODIUM BICARBONATE BOLUS VIA INFUSION
INTRAVENOUS | Status: AC
  Administered 2017-03-01: 75 meq via INTRAVENOUS
  Filled 2017-03-01: qty 1

## 2017-03-01 NOTE — Progress Notes (Signed)
Carotid artery duplex has been completed. 1-39% ICA stenosis bilaterally.  03/01/17 10:07 AM Sonya Carpenter RVT

## 2017-03-02 ENCOUNTER — Other Ambulatory Visit: Payer: Self-pay

## 2017-03-02 ENCOUNTER — Encounter: Payer: Self-pay | Admitting: Physical Therapy

## 2017-03-02 ENCOUNTER — Ambulatory Visit: Payer: Medicare HMO | Attending: Cardiovascular Disease | Admitting: Physical Therapy

## 2017-03-02 ENCOUNTER — Encounter: Payer: Medicare HMO | Admitting: Surgery

## 2017-03-02 DIAGNOSIS — R2689 Other abnormalities of gait and mobility: Secondary | ICD-10-CM | POA: Insufficient documentation

## 2017-03-02 DIAGNOSIS — R293 Abnormal posture: Secondary | ICD-10-CM | POA: Insufficient documentation

## 2017-03-02 NOTE — Therapy (Signed)
[address] [place], [place], [address] Phone: [phone]   Fax:  [phone]  Physical Therapy Evaluation  Patient Details  Name: Tanai Bouler MRN: [id number] Date of Birth: 1946-03-02 Referring Provider: Dr. [name]   Encounter Date: 03/02/2017  PT End of Session - 03/02/17 1359    Visit Number  1    PT Start Time  9381       Past Medical History:  Diagnosis Date  . Anemia   . Aortic stenosis   . Aortic valve disorder   . Asthma   . CAD (coronary artery disease)   . CKD (chronic kidney disease), stage IV ([place])   . Edema   . Gallbladder disease   . Heart murmur   . Hyperlipidemia   . Hypertension   . Other seasonal allergic rhinitis   . SOB (shortness of breath)   . Vitamin D deficiency     Past Surgical History:  Procedure Laterality Date  . BREAST CYST EXCISION Right   . COLONSCOPY    . GALLBLADDER SURGERY    . KNEE SURGERY    . REFRACTIVE SURGERY    . TONSILLECTOMY      There were no vitals filed for this visit.   Subjective Assessment - 03/02/17 1400    Subjective  Pt reports a 4-5 month history of shortness of breath with minimal activity. She now has to be dropped off at the entrance when she goes out into the community. She began using a RW about 6 months ago due to declining health.     Patient Stated Goals  to fix heart and walk without RW    Currently in Pain?  No/denies         [facility] PT Assessment - 03/02/17 0001      Assessment   Medical Diagnosis  severe aortic stenosis    Referring Provider  Dr. [name]    Onset Date/Surgical Date  -- approximately 4-5 months ago   approximately 4-5 months ago     Precautions   Precautions  Fall      Restrictions   Weight Bearing Restrictions  No      Balance Screen   Has the patient fallen in the past 6 months  No    Has the patient had a decrease in activity level because of a fear of falling?   No    Is the  patient reluctant to leave their home because of a fear of falling?   No      Home Environment   Living Environment  Private residence    [place]  One level 1 step down to garage   1 step down to garage   [place] - 2 wheels      Prior Function   Level of [place] with community mobility with device [employer]     Posture/Postural Control   Posture/Postural Control  Postural limitations    Postural Limitations  Rounded Shoulders;Forward head      ROM / Strength   AROM / PROM / Strength  AROM;Strength      AROM   Overall AROM Comments  grossly WNL      Strength   Overall Strength Comments  groosly 5/5 except knee extension 4/5    Strength Assessment Site  Hand    Right/Left hand  Right;Left    Right Hand Grip (lbs)  22    Left Hand Grip (lbs)  20      Ambulation/Gait   Gait Comments  Pt limited by 76% for age/gender with 6 minute walk. Utilized 2 wheeled RW for all gait during evaluation.        [facility] Pre-Surgical Assessment - 03-20-17 0001    5 Meter Walk Test- trial 1  9 sec    5 Meter Walk Test- trial 2  9 sec.     5 Meter Walk Test- trial 3  9 sec.    5 meter walk test average  9 sec    4 Stage Balance Test tolerated for:   10 sec.    4 Stage Balance Test Position  2    Comment  requires UE assist    ADL/IADL Independent with:  Meal prep;Dressing;Bathing;Finances    ADL/IADL Needs Assistance with:  [employer] work    ADL/IADL Fraility Index  Vulnerable    6 Minute Walk- Baseline  yes    BP (mmHg)  104/50    HR (bpm)  90    02 Sat (%RA)  95 %    Modified Borg Scale for Dyspnea  0- Nothing at all    Perceived Rate of Exertion (Borg)  6-    6 Minute Walk Post Test  yes    BP (mmHg)  109/47    HR (bpm)  112    02 Sat (%RA)  95 %    Modified Borg Scale for Dyspnea  1- Very mild shortness of breath    Perceived Rate of Exertion (Borg)  11- Fairly light    Aerobic  Endurance Distance Walked  370           Objective measurements completed on examination: See above findings.              PT Education - 03-20-17 1501    Education provided  Yes    Education Details  fall risk and continued use of RW    Person(s) Educated  Patient    Methods  Explanation    Comprehension  Verbalized understanding                  Plan - 03/20/2017 1359    Clinical Impression Statement  see below    PT Frequency  One time visit      Clinical Impression Statement: Pt is a 71 yo female presenting to OP PT for evaluation prior to possible TAVR surgery due to severe aortic stenosis. Pt reports onset of shortness of breath with activity approximately 4-5 months ago. Symptoms are limiting her ability to walk communiy distances without rests. Pt presents with good ROM and strength, poor balance and is at high fall risk 4 stage balance test, slow walking speed and poor aerobic endurance per 6 minute walk test. Pt ambulated 370 feet in 3:30 before requesting a seated rest beak lasting the reaminder of the 6 minutes. Based on the Short Physical Performance Battery, patient has a frailty rating of 3/12 with </= 5/12 considered frail.   Patient demonstrated the following deficits and impairments:     Visit Diagnosis: Other abnormalities of gait and mobility  Abnormal posture  G-Codes - 2017/03/20 1502    Functional Assessment Tool Used (Outpatient Only)  6 minute walk 370'    Functional Limitation  Mobility: Walking and moving around    Mobility: Walking and Moving Around Current Status ([id number])  At least 60  percent but less than 80 percent impaired, limited or restricted    Mobility: Walking and Moving Around Goal Status [phone])  At least 60 percent but less than 80 percent impaired, limited or restricted    Mobility: Walking and Moving Around Discharge Status [phone]  At least 60 percent but less than 80 percent impaired, limited or restricted         Problem List Patient Active Problem List   Diagnosis Date Noted  . Severe aortic stenosis     NICOLETTA,DANA, PT 03/02/2017, 3:03 PM  [facility] [address] [place], [place], [address] Phone: [phone]   Fax:  [phone]  Name: Wynne Rozak MRN: [id number] Date of Birth: 1945-12-19

## 2017-03-07 ENCOUNTER — Other Ambulatory Visit: Payer: Self-pay

## 2017-03-07 ENCOUNTER — Encounter: Payer: Self-pay | Admitting: Thoracic Surgery (Cardiothoracic Vascular Surgery)

## 2017-03-07 ENCOUNTER — Other Ambulatory Visit: Payer: Self-pay | Admitting: Physician Assistant

## 2017-03-07 ENCOUNTER — Institutional Professional Consult (permissible substitution): Payer: Medicare HMO | Admitting: Thoracic Surgery (Cardiothoracic Vascular Surgery)

## 2017-03-07 ENCOUNTER — Ambulatory Visit (HOSPITAL_BASED_OUTPATIENT_CLINIC_OR_DEPARTMENT_OTHER): Payer: Medicare HMO

## 2017-03-07 ENCOUNTER — Ambulatory Visit (HOSPITAL_COMMUNITY)
Admission: RE | Admit: 2017-03-07 | Discharge: 2017-03-07 | Disposition: A | Discharge status: Home / Self Care | Payer: Medicare HMO | Source: Ambulatory Visit | Attending: Cardiovascular Disease | Admitting: Cardiovascular Disease

## 2017-03-07 VITALS — BP 130/72 | HR 94 | Ht <= 58 in | Wt 110.0 lb

## 2017-03-07 DIAGNOSIS — I350 Nonrheumatic aortic (valve) stenosis: Secondary | ICD-10-CM

## 2017-03-07 DIAGNOSIS — E1122 Type 2 diabetes mellitus with diabetic chronic kidney disease: Secondary | ICD-10-CM | POA: Insufficient documentation

## 2017-03-07 DIAGNOSIS — I1310 Hypertensive heart and chronic kidney disease without heart failure, with stage 1 through stage 4 chronic kidney disease, or unspecified chronic kidney disease: Secondary | ICD-10-CM | POA: Diagnosis not present

## 2017-03-07 DIAGNOSIS — J45909 Unspecified asthma, uncomplicated: Secondary | ICD-10-CM | POA: Diagnosis not present

## 2017-03-07 DIAGNOSIS — N189 Chronic kidney disease, unspecified: Secondary | ICD-10-CM | POA: Insufficient documentation

## 2017-03-07 DIAGNOSIS — E785 Hyperlipidemia, unspecified: Secondary | ICD-10-CM | POA: Insufficient documentation

## 2017-03-07 DIAGNOSIS — I2510 Atherosclerotic heart disease of native coronary artery without angina pectoris: Secondary | ICD-10-CM | POA: Insufficient documentation

## 2017-03-07 DIAGNOSIS — I080 Rheumatic disorders of both mitral and aortic valves: Secondary | ICD-10-CM | POA: Diagnosis not present

## 2017-03-07 LAB — PULMONARY FUNCTION TEST
DL/VA % PRED: 94 %
DL/VA: 3.69 ml/min/mmHg/L
DLCO UNC: 7.96 ml/min/mmHg
DLCO unc % pred: 49 %
FEF 25-75 POST: 1.57 L/s
FEF 25-75 Pre: 0.51 L/sec
FEF2575-%Change-Post: 207 %
FEF2575-%Pred-Post: 100 %
FEF2575-%Pred-Pre: 32 %
FEV1-%CHANGE-POST: 48 %
FEV1-%PRED-POST: 76 %
FEV1-%PRED-PRE: 51 %
FEV1-POST: 1.32 L
FEV1-PRE: 0.89 L
FEV1FVC-%Change-Post: 19 %
FEV1FVC-%PRED-PRE: 82 %
FEV6-%Change-Post: 23 %
FEV6-%PRED-POST: 80 %
FEV6-%PRED-PRE: 65 %
FEV6-POST: 1.77 L
FEV6-Pre: 1.42 L
FEV6FVC-%CHANGE-POST: 0 %
FEV6FVC-%PRED-POST: 105 %
FEV6FVC-%Pred-Pre: 104 %
FVC-%Change-Post: 23 %
FVC-%Pred-Post: 77 %
FVC-%Pred-Pre: 62 %
FVC-Post: 1.77 L
FVC-Pre: 1.43 L
POST FEV6/FVC RATIO: 100 %
PRE FEV1/FVC RATIO: 62 %
PRE FEV6/FVC RATIO: 100 %
Post FEV1/FVC ratio: 75 %
RV % pred: 124 %
RV: 2.36 L
TLC % PRED: 97 %
TLC: 4.06 L

## 2017-03-07 MED ORDER — ALBUTEROL SULFATE (2.5 MG/3ML) 0.083% IN NEBU
2.5000 mg | INHALATION_SOLUTION | Freq: Once | RESPIRATORY_TRACT | Status: AC
  Administered 2017-03-07: 2.5 mg via RESPIRATORY_TRACT

## 2017-03-07 NOTE — Progress Notes (Addendum)
[facility]  MULTIDISCIPLINARY HEART VALVE CLINIC  CARDIOTHORACIC SURGERY CONSULTATION REPORT  Referring Provider is Delanna Notice, MD PCP is Michell Heinrich, DO  Chief Complaint  Patient presents with  . 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  Patient states that she has known of the presence of a heart murmur for several years.  She was seen approximately 3 years ago by Dr. Rosalita Chessman and echocardiogram reportedly revealed findings consistent with moderate aortic stenosis.  Patient was lost to follow-up for a period of time primarily because her husband was in poor health.  The patient was her husband's primary caretaker and remained reasonably active and functionally independent until several months ago when she was hospitalized in [place] for what was initially felt to be pneumonia.  During that hospitalization she was thought to have a possible abdominal mass with biliary ductal dilitation. She was referred to [facility] in [place] where she was diagnosed with choledochocholelithiasis by ERCP.  She did not have an abdominal mass but she was noted to have some gallstones with intermittent episodes of right upper quadrant abdominal discomfort brought on with large meals.  Elective cholecystectomy was planned but preoperative cardiac clearance requested due to the presence of her aortic stenosis.  She was subsequently readmitted to the hospital in [place] in September with shortness of breath associated with large right pleural effusion requiring  thoracentesis.  She was treated for possible pneumonia.  Transthoracic echocardiogram revealed severe aortic stenosis with peak and mean transvalvular gradients reported 121 and 69 mmHg, respectively.  Left ventricular systolic function remains normal with ejection fraction estimated 65%.  She was referred to Dr. Burt Knack for consultation and underwent left and right heart catheterization by Dr. Angelena Form on February 09, 2017.  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  Peak to peak and mean transvalvular gradients across the aortic valve were measured 35 and 18.2 mmHg corresponding to aortic valve area calculated 1.26 cm squared.  Pulmonary function tests and CT angiography was performed and the patient was referred for elective surgical consultation.  Patient is recently widowed and lives alone in [place].  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  She does not exercise on a regular basis and she reports living a somewhat sedentary lifestyle, but she reports no significant physical limitations.  She does complain of progressive symptoms of exertional shortness of breath.  Symptoms of exertional shortness of breath date back many years.  They have gotten progressively worse over the last 4-5 months.  She currently gets short of breath with moderate low level activity and was recently hospitalized with resting shortness of breath and orthopnea.  She has had some lower extremity edema.  Symptoms have been somewhat improved on medical therapy.  She has not had chest pain or chest tightness either with activity or at rest.  She has not had dizzy spells or syncope.  Patient has never been a smoker but her husband was a heavy smoker.  She reports that she does have very poor dentition  with several loose teeth.  She has not seen a dentist in  quite some time.   Past Medical History:  Diagnosis Date  . Anemia   . Aortic stenosis   . Aortic valve disorder   . Asthma   . CAD (coronary artery disease)   . CKD (chronic kidney disease), stage IV ([place])   . Edema   . Gallbladder disease   . Heart murmur   . Hyperlipidemia   . Hypertension   . Other seasonal allergic rhinitis   . SOB (shortness of breath)   . Vitamin D deficiency     Past Surgical History:  Procedure Laterality Date  . BREAST CYST EXCISION Right   . COLONSCOPY    . GALLBLADDER SURGERY    . KNEE SURGERY    . REFRACTIVE SURGERY    . TONSILLECTOMY      Family History  Problem Relation Age of Onset  . Hypertension Mother   . Kidney failure Mother   . Heart disease Mother   . Hypertension Father   . Heart disease Father   . Diabetes Father   . Cancer Sister     Social History   Socioeconomic History  . Marital status: Widowed    Spouse name: Not on file  . Number of children: 0  . Years of education: Not on file  . Highest education level: Not on file  Social Needs  . Financial resource strain: Not on file  . Food insecurity - worry: Not on file  . Food insecurity - inability: Not on file  . Transportation needs - medical: Not on file  . Transportation needs - non-medical: Not on file  Occupational History  . Not on file  Tobacco Use  . Smoking status: Never Smoker  . Smokeless tobacco: Never Used  Substance and Sexual Activity  . Alcohol use: No    Frequency: Never  . Drug use: No  . Sexual activity: No  Other Topics Concern  . Not on file  Social History Narrative   Here with Uncle and [place].    Current Outpatient Medications  Medication Sig Dispense Refill  . acetaminophen (TYLENOL) 500 MG tablet Take 500 mg by mouth every 6 (six) hours as needed for mild pain or moderate pain.    Marland Kitchen albuterol (2.5 MG/3ML) 0.083% NEBU 3 mL, albuterol (5 MG/ML) 0.5% NEBU 0.5 mL Inhale 1 mg into the lungs 3 (three) times daily as needed  (shortness of breath).     Marland Kitchen albuterol (PROVENTIL HFA;VENTOLIN HFA) 108 (90 Base) MCG/ACT inhaler Inhale into the lungs every 6 (six) hours as needed for wheezing or shortness of breath.    Marland Kitchen amLODipine (NORVASC) 5 MG tablet Take 5 mg by mouth daily.    Marland Kitchen aspirin EC 81 MG tablet Take 81 mg by mouth daily.    Marland Kitchen atorvastatin (LIPITOR) 20 MG tablet Take 20 mg by mouth daily.    . budesonide-formoterol (SYMBICORT) 160-4.5 MCG/ACT inhaler Inhale 2 puffs into the lungs 2 (two) times daily.    . cetirizine (ZYRTEC) 10 MG tablet Take 10 mg by mouth daily.    Marland Kitchen Dextran 70-Hypromellose (ARTIFICIAL TEARS) 0.1-0.3 % SOLN Place 1 drop into both eyes 2 (two) times daily.     Mariane Baumgarten Calcium (STOOL SOFTENER PO) Take 1 capsule by mouth 2 (two) times daily.    . ferrous sulfate 325 (65 FE) MG EC tablet Take 325 mg by mouth 2 (two) times daily.     . furosemide (  LASIX) 40 MG tablet Take 1 tablet (40 mg total) by mouth daily. 30 tablet 3  . ipratropium (ATROVENT) 0.02 % nebulizer solution Take 0.5 mg by nebulization 2 (two) times daily.    Marland Kitchen LANTUS SOLOSTAR 100 UNIT/ML Solostar Pen Inject 20 Units into the skin every morning.     Marland Kitchen losartan (COZAAR) 100 MG tablet Take 50 mg by mouth daily.     Marland Kitchen lubiprostone (AMITIZA) 8 MCG capsule Take 8 mcg by mouth daily.    . montelukast (SINGULAIR) 10 MG tablet Take 10 mg by mouth at bedtime.    . Multiple Vitamins-Minerals (MULTIVITAMIN WITH MINERALS) tablet Take 1 tablet by mouth daily.    . potassium chloride (K-DUR) 10 MEQ tablet Take 1 tablet (10 mEq total) by mouth daily. 30 tablet 3  . sodium chloride (OCEAN) 0.65 % SOLN nasal spray Place 1 spray into both nostrils 2 (two) times daily as needed for congestion.     . traMADol (ULTRAM) 50 MG tablet Take 50 mg by mouth every 6 (six) hours as needed (pain).      No current facility-administered medications for this visit.     Allergies  Allergen Reactions  . No Known Allergies       Review of  Systems:   General:  normal appetite, decreased energy, no weight gain, no weight loss, no fever  Cardiac:  no chest pain with exertion, no chest pain at rest, +SOB with exertion, occasional resting SOB, no PND, + orthopnea, no palpitations, no arrhythmia, no atrial fibrillation, + LE edema, no dizzy spells, no syncope  Respiratory:  + shortness of breath, no home oxygen, no productive cough, + dry cough, + bronchitis, no wheezing, no hemoptysis, + asthma, no pain with inspiration or cough, no sleep apnea, no CPAP at night  GI:   no difficulty swallowing, no reflux, no frequent heartburn, no hiatal hernia, + abdominal pain, + constipation, no diarrhea, no hematochezia, no hematemesis, no melena  GU:   no dysuria,  no frequency, no urinary tract infection, no hematuria, no kidney stones, + chronic kidney disease  Vascular:  no pain suggestive of claudication, no pain in feet, no leg cramps, no varicose veins, no DVT, no non-healing foot ulcer  Neuro:   no stroke, no TIA's, no seizures, no headaches, no temporary blindness one eye,  no slurred speech, no peripheral neuropathy, no chronic pain, no instability of gait, no memory/cognitive dysfunction  Musculoskeletal: + arthritis, no joint swelling, no myalgias, no difficulty walking, normal mobility   Skin:   no rash, no itching, + skin infections, no pressure sores or ulcerations  Psych:   no anxiety, no depression, no nervousness, no unusual recent stress  Eyes:   + blurry vision, no floaters, no recent vision changes, + wears glasses or contacts  ENT:   + hearing loss, no loose or painful teeth, no dentures  Hematologic:  + easy bruising, no abnormal bleeding, no clotting disorder, no frequent epistaxis  Endocrine:  + diabetes, does check CBG's at home           Physical Exam:   BP 130/72   Pulse 94   Ht 4\' 10"  (1.473 m)   Wt 110 lb (49.9 kg)   SpO2 98%   BMI 22.99 kg/m   General:  Somewhat frail-appearing  HEENT:  Unremarkable.  Poor  dentition  Neck:   no JVD, no bruits, no adenopathy   Chest:   clear to auscultation, symmetrical breath sounds, no wheezes, no  rhonchi   CV:   RRR, grade IV/VI crescendo/decrescendo murmur heard best at RUSB,  no diastolic murmur  Abdomen:  soft, non-tender, no masses   Extremities:  warm, well-perfused, pulses diminished, no LE edema  Rectal/GU  Deferred  Neuro:   Grossly non-focal and symmetrical throughout  Skin:   Clean and dry, no rashes, no breakdown   Diagnostic Tests:  TRANSTHORACIC ECHOCARDIOGRAM  Both images and report from transthoracic echocardiogram performed December 08, 2016 example [place] are reviewed.  The patient appears to have critical aortic stenosis with peak velocity measured across the aortic valve greater than 5 m/s.  1 of the patient's aortic 3 valve leaflets is heavily calcified and thickened and essentially immobile.  Third leaflet appears to move reasonably well.  Left ventricular systolic function remains vigorous with at least moderate left ventricular hypertrophy.  There is mild thickening of the mitral valve and transvalvular gradient was mildly elevated although the mitral valve leaflets appear to move reasonably well.  Elevated transvalvular velocities potentially could be affected by high flow, but the patient clearly has significant aortic stenosis.    RIGHT/LEFT HEART CATH AND CORONARY ANGIOGRAPHY  Conclusion     Mid RCA to Dist RCA lesion, 40 %stenosed.  Prox RCA to Mid RCA lesion, 20 %stenosed.  Ost Cx to Prox Cx lesion, 70 %stenosed.  Ost 3rd Mrg to 3rd Mrg lesion, 40 %stenosed.  Prox LAD to Mid LAD lesion, 30 %stenosed.  Dist LAD lesion, 20 %stenosed.  Ost 1st Diag lesion, 40 %stenosed.  Ost 2nd Diag to 2nd Diag lesion, 30 %stenosed.  There is severe aortic valve stenosis.   1. Single vessel CAD with moderately severe, calcified stenosis in the proximal Circumflex artery 2. Mild non-obstructive disease in the RCA and LAD 3.  Severe aortic stenosis (mean gradient 18.30mmHg, peak gradient 35 mmHg, AVA 1.26 cm2 ).   Recommendations: Will review films with the TAVR team. We may be able to manage her CAD medically. Will continue workup for TAVR.    Indications   Severe aortic stenosis [I35.0 (ICD-10-CM)]  Procedural Details/Technique   Technical Details Indication: 71 yo female with h/o DM, severe aortic stenosis. Pre-TAVR workup.   Procedure: The risks, benefits, complications, treatment options, and expected outcomes were discussed with the patient. The patient and/or family concurred with the proposed plan, giving informed consent. The patient was brought to the cath lab after IV hydration was given. The patient was further sedated with Versed and Fentanyl. There was an IV catheter present in the right antecubital vein. This was prepped and draped. I then changed out this catheter for a % French sheath. Right heart cath performed with a balloon tipped catheter. The right wrist was prepped and draped in a sterile fashion. 1% lidocaine was used for local anesthesia. Using the modified Seldinger access technique, a 5 French sheath was placed in the right radial artery. 3 mg Verapamil was given through the sheath. 3000 units IV heparin was given. Standard diagnostic catheters were used to perform selective coronary angiography. I crossed the aortic valve with an AL-1 catheter and a straight wire. The sheath was removed from the right radial artery and a Terumo hemostasis band was applied at the arteriotomy site on the right wrist.     Estimated blood loss <50 mL.  During this procedure the patient was administered the following to achieve and maintain moderate conscious sedation: Versed 1 mg, Fentanyl 25 mcg, while the patient's heart rate, blood pressure, and oxygen saturation were continuously  monitored. The period of conscious sedation was 32 minutes, of which I was present face-to-face 100% of this time.  Complications    Complications documented before study signed (02/09/2017 1:45 PM EDT)    RIGHT/LEFT HEART CATH AND CORONARY ANGIOGRAPHY   None Documented by Burnell Blanks, MD 02/09/2017 1:44 PM EDT  Time Range: Intra-procedure      Coronary Findings   Diagnostic  Dominance: Right  Left Anterior Descending  Prox LAD to Mid LAD lesion 30% stenosed  Prox LAD to Mid LAD lesion.  Dist LAD lesion 20% stenosed  Dist LAD lesion.  First Diagonal Branch  Vessel is small in size.  Ost 1st Diag lesion 40% stenosed  Ost 1st Diag lesion.  Second Diagonal Branch  Vessel is small in size.  Ost 2nd Diag to 2nd Diag lesion 30% stenosed  Ost 2nd Diag to 2nd Diag lesion.  Ramus Intermedius  Vessel is small.  Left Circumflex  Vessel is moderate in size.  Ost Cx to Prox Cx lesion 70% stenosed  The lesion is calcified.  First Obtuse Marginal Branch  Vessel is small in size.  Second Obtuse Marginal Branch  Vessel is moderate in size.  Third Obtuse Marginal Branch  Vessel is small in size.  Ost 3rd Mrg to 3rd Mrg lesion 40% stenosed  Ost 3rd Mrg to 3rd Mrg lesion.  Right Coronary Artery  Prox RCA to Mid RCA lesion 20% stenosed  Prox RCA to Mid RCA lesion.  Mid RCA to Dist RCA lesion 40% stenosed  The lesion is calcified.  Intervention   No interventions have been documented.  Left Heart   Aortic Valve There is severe aortic valve stenosis.  Coronary Diagrams   Diagnostic Diagram       Implants     No implant documentation for this case.  MERGE Images   Show images for CARDIAC CATHETERIZATION   Link to Procedure Log   Procedure Log    Hemo Data    Most Recent Value  Fick Cardiac Output 5.17 L/min  Fick Cardiac Output Index 3.67 (L/min)/BSA  Aortic Mean Gradient 18.2 mmHg  Aortic Peak Gradient 35 mmHg  Aortic Valve Area 1.26  Aortic Value Area Index 0.89 cm2/BSA  RA A Wave 5 mmHg  RA V Wave 2 mmHg  RA Mean 2 mmHg  RV Systolic Pressure 39 mmHg  RV Diastolic  Pressure 1 mmHg  RV EDP 3 mmHg  PA Systolic Pressure 35 mmHg  PA Diastolic Pressure 13 mmHg  PA Mean 22 mmHg  PW A Wave 20 mmHg  PW V Wave 19 mmHg  PW Mean 13 mmHg  AO Systolic Pressure 025 mmHg  AO Diastolic Pressure 50 mmHg  AO Mean 78 mmHg  LV Systolic Pressure 427 mmHg  LV Diastolic Pressure 4 mmHg  LV EDP 10 mmHg  Arterial Occlusion Pressure Extended Systolic Pressure 062 mmHg  Arterial Occlusion Pressure Extended Diastolic Pressure 56 mmHg  Arterial Occlusion Pressure Extended Mean Pressure 87 mmHg  Left Ventricular Apex Extended Systolic Pressure 376 mmHg  Left Ventricular Apex Extended Diastolic Pressure 5 mmHg  Left Ventricular Apex Extended EDP Pressure 9 mmHg  QP/QS 1  TPVR Index 6 HRUI  TSVR Index 21.27 HRUI  PVR SVR Ratio 0.12  TPVR/TSVR Ratio 0.28    Cardiac TAVR CT  MEDICATIONS: None  TECHNIQUE: The patient was scanned on a Siemens Force 283 slice scanner. A 120 kV retrospective scan was triggered in the ascending thoracic aorta at 140 HU's. Gantry rotation speed  was 250 msecs and collimation was .6 mm. No beta blockade or nitro were given. The 3D data set was reconstructed in 5% intervals of the R-R cycle. Systolic and diastolic phases were analyzed on a dedicated work station using MPR, MIP and VRT modes. The patient received 80 cc of contrast.  The patient was scanned on a Siemens Force 161 slice scanner. Gantry rotation speed was 250 msecs. Collimation was .6 mm. A 100 kV prospective scan was triggered in the ascending thoracic aorta at 140 HU's Full mA was used between 35% and 75% of the R-R interval. Average HR during the scan was 80 bpm. The 3D data set was interpreted on a dedicated work station using MPR, MIP and VRT modes. A total of 80cc of contrast was used for the entire study including the chest CTA and run off. Patient had bicarbonate drip prior to procedure for CRF.  FINDINGS: Aortic Valve: Tri leaflet and moderately calcified  with restricted leaflet motion.  Aorta: Moderate atherosclerotic disease with normal arch vessel origin  Sino-tubular Junction:  24 mm  Ascending Thoracic Aorta:  30 mm  Aortic Arch:  23 mm  Descending Thoracic Aorta:  21 mm  Sinus of Valsalva Measurements:  Non-coronary:  31 mm  Right - coronary:  30 mm  Left -   coronary:  30 mm  Coronary Artery Height above Annulus:  Left Main:  11.5 mm above annulus  Right Coronary:  16.4 mm above annulus  Virtual Basal Annulus Measurements:  Maximum / Minimum Diameter:  26.6 mm x 19.2 mm elliptical  Perimeter:  72.7 mm  Area:  296 mm2  Coronary Arteries:  Sufficient height above annulus for deployment  Optimum Fluoroscopic Angle for Delivery: LAO 17 degrees Caudal 9 degrees  IMPRESSION: 1) Calcified tri leaflet aortic valve with annular area of 396 mm2 suitable for a 23 mm Sapien 3 valve and perimeter of 72.7 mm suitable for a 26 mm Evolut Pro valve  2) Optimum angiographic angle for deployment LAO 17 degrees Caudal 9 degrees  3) Coronary arteries sufficient height above annulus for deployment  4) Normal aortic root and arch vessels with moderate calcific atherosclerosis  5) Significant nodular calcium at base of left annular sinus extending into the intervalvular fibrosa. Also appears to be significant thickening and restriction to motion of mitral valve suggest echo correlation  6) Moderate LAE with no LAA thrombus  Jenkins Rouge  Electronically Signed: By: Jenkins Rouge M.D. On: 03/01/2017 14:57  CT ANGIOGRAPHY CHEST, ABDOMEN AND PELVIS  TECHNIQUE: Multidetector CT imaging through the chest, abdomen and pelvis was performed using the standard protocol during bolus administration of intravenous contrast. Multiplanar reconstructed images and MIPs were obtained and reviewed to evaluate the vascular anatomy.  CONTRAST:  80 cc Isovue 370 IV.  COMPARISON:   None.  FINDINGS: CTA CHEST FINDINGS  Cardiovascular: Mild cardiomegaly. There is diffuse thickening and coarse calcification of the aortic valve. No significant pericardial fluid/thickening. Left main, left anterior descending, left circumflex and right coronary atherosclerosis . Atherosclerotic nonaneurysmal thoracic aorta. No evidence of acute intramural hematoma, dissection, pseudoaneurysm or penetrating atherosclerotic ulcer. Aortic arch branch vessels are patent. Top-normal caliber main pulmonary artery (3.0 cm diameter). No central pulmonary emboli.  Mediastinum/Nodes: Multinodular goiter with several hypodense nodules throughout the bilateral thyroid lobes, largest 1.1 cm in the upper right thyroid lobe. Mildly patulous thoracic esophagus. Small fluid level in the lower thoracic esophagus. No pathologically enlarged axillary, mediastinal or hilar lymph nodes.  Lungs/Pleura: No pneumothorax. No  pleural effusion. No acute consolidative airspace disease, lung masses or significant pulmonary nodules. Nonspecific diffuse bronchial wall thickening.  Musculoskeletal: No aggressive appearing focal osseous lesions. Moderate thoracic spondylosis.  CTA ABDOMEN AND PELVIS FINDINGS  Hepatobiliary: Normal liver size. Nonspecific heterogeneous liver parenchymal enhancement with numerous subcentimeter nodular foci of hyperenhancement throughout the liver, probably representing transient hepatic attenuation differences due to the early phase of contrast. No definite liver masses. Normal gallbladder with no radiopaque cholelithiasis. There is pneumobilia in the left central bile ducts. No intrahepatic biliary ductal dilatation. Dilated common bile duct (11 mm diameter) with smooth distal tapering. No radiopaque choledocholithiasis.  Pancreas: No pancreatic mass.  No pancreatic duct dilation.  Spleen: Normal size. No mass.  Adrenals/Urinary Tract: Right adrenal 1.5 cm nodule  with density 103 HU (series 14/ image 81). No left adrenal nodules. No hydronephrosis. Small simple left renal cysts, largest 1.5 cm. Additional subcentimeter hypodense renal cortical lesions in both kidneys, too small to characterize, requiring no follow-up. Normal bladder.  Stomach/Bowel: Grossly normal stomach. Normal caliber small bowel with no small bowel wall thickening. Normal appendix. Mild scattered colonic diverticulosis, with no large bowel wall thickening or pericolonic fat stranding.  Vascular/Lymphatic: Atherosclerotic nonaneurysmal abdominal aorta. Patent portal, splenic and renal veins. No pathologically enlarged lymph nodes in the abdomen or pelvis.  Reproductive: Mildly enlarged myomatous uterus with coarsely calcified posterior uterine 4.8 cm subserosal fibroid. No adnexal masses.  Other: No pneumoperitoneum, ascites or focal fluid collection.  Musculoskeletal: No aggressive appearing focal osseous lesions. Mild lumbar spondylosis. Anterolisthesis at L4-5 measures 8 mm.  VASCULAR MEASUREMENTS PERTINENT TO TAVR:  AORTA:  Minimal Aortic Diameter -  13 x 13 mm (infrarenal abdominal aorta)  Severity of Aortic Calcification -  moderate  RIGHT PELVIS:  Right Common Iliac Artery -  Minimal Diameter - 7.2 x 5.1 mm  Tortuosity - mild-to-moderate  Calcification - moderate  There is a moderate focal atherosclerotic stenosis in the right common iliac artery approximately 2 cm below the aortic bifurcation.  Right External Iliac Artery -  Minimal Diameter - 6.1 x 5.9 mm  Tortuosity - mild  Calcification - mild  Right Common Femoral Artery -  Minimal Diameter - 6.0 x 5.5 mm  Tortuosity - mild  Calcification - moderate to severe  LEFT PELVIS:  Left Common Iliac Artery -  Minimal Diameter - 8.1 x 7.2 mm  Tortuosity - moderate  Calcification - moderate  Left External Iliac Artery -  Minimal Diameter - 6.0 x 5.9  mm  Tortuosity - mild  Calcification - mild  Left Common Femoral Artery -  Minimal Diameter - 5.3 x 3.8 mm  Tortuosity - mild  Calcification - moderate to severe  There is early bifurcation of the left common femoral artery.  Review of the MIP images confirms the above findings.  IMPRESSION: 1. Vascular findings and measurements pertinent to potential TAVR procedure, as detailed above. 2. Severe thickening and calcification of the aortic valve, compatible with the reported clinical history of severe aortic stenosis. 3. Aortic Atherosclerosis (ICD10-I70.0). Left main and 3 vessel coronary atherosclerosis. Mild cardiomegaly. 4. 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 Follow-up adrenal protocol CT abdomen without and with IV contrast could be considered in 12 months. This recommendation follows ACR consensus guidelines: Management of Incidental Adrenal Masses: A White Paper of the ACR Incidental Findings Committee. J Am Coll Radiol 2017;14:1038-1044. 5. Pneumobilia, correlate for any history of sphincterotomy. Mild common bile duct dilation (11 mm diameter) without  intrahepatic dilation. No radiopaque cholelithiasis or choledocholithiasis. Recommend correlation with serum bilirubin levels. MRI abdomen with MRCP without and with IV contrast could be considered for further evaluation as clinically warranted. 6. Additional chronic findings as detailed.   Electronically Signed   By: Ilona Sorrel M.D.   On: 03/01/2017 14:58     Impression:  Patient has stage D severe symptomatic aortic stenosis.  She currently describes stable symptoms of exertional shortness of breath occurring with moderate low level activity consistent with chronic diastolic congestive heart failure, [place] Heart Association functional class III.  She has recently been hospitalized on 2 occasions with resting shortness of breath that has been  previously attributed to pneumonia but potentially consistent with acute exacerbations of class IV heart failure.  I have personally reviewed the patient's transthoracic echocardiogram performed in [place] last August and the diagnostic cardiac catheterization performed recently in [place].  Echocardiogram and catheterization revealed somewhat contradictory findings with extremely elevated velocity across the aortic valve consistent with critical aortic stenosis.  However, 1 of the patient's 3 aortic valve leaflets appears to move reasonably well and transvalvular gradients measured at catheterization were considerably lower.  The patient's aortic valve annulus in size is relatively small and 1 of the 3 leaflets is extremely thick and essentially fixed.   I agree the patient should probably be treated with aortic valve replacement, although repeat transthoracic echocardiogram might be prudent.  Risks associated with conventional surgery would unquestionably be high because of the patient's numerous severe comorbid medical problems.  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  She might be at increased risk for vascular complications with percutaneous access.  Trans-carotid or trans-subclavian access could be considered if right transfemoral access proved to be problematic.  She has very poor dentition and may need dental extraction.    Plan:  The patient and her family were counseled at length regarding treatment alternatives for management of severe symptomatic aortic  stenosis. Alternative approaches such as conventional aortic valve replacement, transcatheter aortic valve replacement, and palliative medical therapy were compared and contrasted at length.  The risks associated with conventional surgical aortic valve replacement were been discussed in detail, as were expectations for post-operative convalescence, and why I would be reluctant to consider this patient a candidate for conventional surgery.  Issues specific to transcatheter aortic valve replacement were discussed including questions about long term valve durability, the potential for paravalvular leak, possible increased risk of need for permanent pacemaker placement, and other technical complications related to the procedure itself.  Long-term prognosis with medical therapy was discussed. This discussion was placed in the context of the patient's own specific clinical presentation and past medical history.  All of their questions been addressed.  The patient hopes to proceed with transcatheter aortic valve replacement in the near future.  As a next step the patient will undergo follow-up transthoracic echocardiogram to confirm the severity of aortic stenosis.  In addition, she will be referred for dental service consultation.  She will also undergo a second surgical consultation in the near future.   We tentatively plan to proceed with transcatheter aortic valve replacement via transfemoral approach on March 22, 2017.  Following the decision to proceed with transcatheter aortic valve replacement, a discussion has been held regarding what types of management strategies would be attempted intraoperatively in the event of life-threatening complications, including whether or not the patient would be considered a candidate for the use of cardiopulmonary bypass and/or conversion to open sternotomy for attempted surgical intervention.  The patient has been advised of a variety of complications that might develop  including but not limited to risks of death, stroke, paravalvular leak, aortic dissection or other major vascular complications, aortic annulus rupture, device embolization, cardiac rupture or perforation, mitral regurgitation, acute myocardial infarction, arrhythmia, heart block or bradycardia requiring permanent pacemaker placement, congestive heart failure, respiratory failure, renal failure, pneumonia, infection, other late complications related to structural valve deterioration or migration, or other complications that might ultimately cause a temporary or permanent loss of functional independence or other long term morbidity.  The patient provides full informed consent for the procedure as described and all questions were answered.   I spent in excess of 90 minutes during the conduct of this office consultation and >50% of this time involved direct face-to-face encounter with the patient for counseling and/or coordination of their care.    Valentina Gu. Roxy Manns, MD 03/07/2017

## 2017-03-08 ENCOUNTER — Institutional Professional Consult (permissible substitution): Payer: Medicare HMO | Admitting: Surgery

## 2017-03-08 ENCOUNTER — Encounter: Payer: Self-pay | Admitting: Surgery

## 2017-03-08 VITALS — BP 108/59 | HR 89 | Ht <= 58 in | Wt 110.0 lb

## 2017-03-08 DIAGNOSIS — I350 Nonrheumatic aortic (valve) stenosis: Secondary | ICD-10-CM | POA: Diagnosis not present

## 2017-03-08 LAB — ECHOCARDIOGRAM COMPLETE
HEIGHTINCHES: 58 in
WEIGHTICAEL: 1760 [oz_av]

## 2017-03-08 NOTE — Progress Notes (Signed)
[facility]  MULTIDISCIPLINARY HEART VALVE CLINIC  CARDIOTHORACIC SURGERY CONSULTATION REPORT  Referring Provider is Delanna Notice, MD PCP is Michell Heinrich, DO  Chief Complaint  Patient presents with  . TAVR evaluation    HPI:  The patient is a 71 year old woman with a history of hypertension, type 2 diabetes with multiple complications, stage IV chronic kidney disease with a creatinine of 1.86, at least moderate COPD with long-standing history of secondhand smoke exposure from her husband, asthma, congestive heart failure, aortic stenosis, and gallbladder disease.  She had an echo about 3 years ago which showed moderate aortic stenosis but then was lost to follow-up because her husband was in poor health.  He eventually died and she did fairly well until several months ago when she was hospitalized in [place] for what was felt to be pneumonia.  During that hospitalization she was thought to have a possible abdominal mass with biliary ductal dilatation and was referred to [facility] where she was diagnosed with choledocho-cholelithiasis by ERCP.  She did not have an abdominal mass but did have gallstones and was told that she required cholecystectomy.  Preoperative cardiac clearance was requested.  She was subsequently readmitted to the hospital in [place] in September with shortness of breath and a large right pleural effusion that required thoracentesis.  She was treated for possible pneumonia.  A transthoracic echocardiogram at that time showed a mean transvalvular gradient of 69 mmHg with a peak of 121 mmHg.  She was then seen by Dr. Burt Knack for consideration of TAVR and underwent cardiac catheterization on February 09, 2017.  This showed a 70% proximal left circumflex coronary stenosis with otherwise mild nonobstructive coronary disease.  The mean transvalvular gradient was 18.2 mmHg and a peak to peak gradient was 35 mmHg.  Aortic valve area was calculated at 1.26 cm.  She then  underwent her preoperative CT scans for consideration of TAVR and was seen yesterday by Dr. Roxy Manns.  Due to the discrepancy in her previous echocardiogram and her catheterization findings she underwent a repeat echocardiogram yesterday that was read by Dr. Meda Coffee.  This showed a trileaflet, severely thickened, severely calcified aortic valve with restricted mobility and a mean gradient of 91 mmHg with a peak gradient of 131 mmHg.  The mitral valve had a calcified annulus with severely thickened and calcified leaflets with restricted mobility and a peak gradient of 23 mmHg.  There was mild regurgitation.  Left ventricular ejection fraction was 65-70% with moderate concentric left ventricular hypertrophy and grade 1 diastolic dysfunction.  She is here today with her uncle.  She is widowed and lives alone.  She has a somewhat sedentary lifestyle and uses a walker for balance.  She reports progressive symptoms of exertional shortness of breath and swelling in her legs that improved with Lasix.  She was recently hospitalized with resting shortness of breath and orthopnea as well as lower extremity edema.  She denies any chest pain or pressure.  She denies dizziness and syncope.  Past Medical History:  Diagnosis Date  . Anemia   . Aortic stenosis   . Aortic valve disorder   . Asthma   . CAD (coronary artery disease)   . CKD (chronic kidney disease), stage IV ([place])   . Edema   . Gallbladder disease   . Heart murmur   . Hyperlipidemia   . Hypertension   . Other seasonal allergic rhinitis   . SOB (shortness of breath)   . Vitamin D deficiency  Past Surgical History:  Procedure Laterality Date  . BREAST CYST EXCISION Right   . COLONSCOPY    . GALLBLADDER SURGERY    . KNEE SURGERY    . REFRACTIVE SURGERY    . TONSILLECTOMY      Family History  Problem Relation Age of Onset  . Hypertension Mother   . Kidney failure Mother   . Heart disease Mother   . Hypertension Father   . Heart disease  Father   . Diabetes Father   . Cancer Sister     Social History   Socioeconomic History  . Marital status: Widowed    Spouse name: Not on file  . Number of children: 0  . Years of education: Not on file  . Highest education level: Not on file  Social Needs  . Financial resource strain: Not on file  . Food insecurity - worry: Not on file  . Food insecurity - inability: Not on file  . Transportation needs - medical: Not on file  . Transportation needs - non-medical: Not on file  Occupational History  . Not on file  Tobacco Use  . Smoking status: Never Smoker  . Smokeless tobacco: Never Used  Substance and Sexual Activity  . Alcohol use: No    Frequency: Never  . Drug use: No  . Sexual activity: No  Other Topics Concern  . Not on file  Social History Narrative   Here with Uncle and [place].    Current Outpatient Medications  Medication Sig Dispense Refill  . acetaminophen (TYLENOL) 500 MG tablet Take 500 mg by mouth every 6 (six) hours as needed for mild pain or moderate pain.    Marland Kitchen albuterol (2.5 MG/3ML) 0.083% NEBU 3 mL, albuterol (5 MG/ML) 0.5% NEBU 0.5 mL Inhale 1 mg into the lungs 3 (three) times daily as needed (shortness of breath).     Marland Kitchen albuterol (PROVENTIL HFA;VENTOLIN HFA) 108 (90 Base) MCG/ACT inhaler Inhale into the lungs every 6 (six) hours as needed for wheezing or shortness of breath.    Marland Kitchen amLODipine (NORVASC) 5 MG tablet Take 5 mg by mouth daily.    Marland Kitchen aspirin EC 81 MG tablet Take 81 mg by mouth daily.    Marland Kitchen atorvastatin (LIPITOR) 20 MG tablet Take 20 mg by mouth daily.    . budesonide-formoterol (SYMBICORT) 160-4.5 MCG/ACT inhaler Inhale 2 puffs into the lungs 2 (two) times daily.    . cetirizine (ZYRTEC) 10 MG tablet Take 10 mg by mouth daily.    Marland Kitchen Dextran 70-Hypromellose (ARTIFICIAL TEARS) 0.1-0.3 % SOLN Place 1 drop into both eyes 2 (two) times daily.     Mariane Baumgarten Calcium (STOOL SOFTENER PO) Take 1 capsule by mouth 2 (two) times daily.    . ferrous  sulfate 325 (65 FE) MG EC tablet Take 325 mg by mouth 2 (two) times daily.     . furosemide (LASIX) 40 MG tablet Take 1 tablet (40 mg total) by mouth daily. 30 tablet 3  . ipratropium (ATROVENT) 0.02 % nebulizer solution Take 0.5 mg by nebulization 2 (two) times daily.    Marland Kitchen LANTUS SOLOSTAR 100 UNIT/ML Solostar Pen Inject 20 Units into the skin every morning.     Marland Kitchen losartan (COZAAR) 100 MG tablet Take 50 mg by mouth daily.     Marland Kitchen lubiprostone (AMITIZA) 8 MCG capsule Take 8 mcg by mouth daily.    . montelukast (SINGULAIR) 10 MG tablet Take 10 mg by mouth at bedtime.    . Multiple  Vitamins-Minerals (MULTIVITAMIN WITH MINERALS) tablet Take 1 tablet by mouth daily.    . potassium chloride (K-DUR) 10 MEQ tablet Take 1 tablet (10 mEq total) by mouth daily. 30 tablet 3  . sodium chloride (OCEAN) 0.65 % SOLN nasal spray Place 1 spray into both nostrils 2 (two) times daily as needed for congestion.     . traMADol (ULTRAM) 50 MG tablet Take 50 mg by mouth every 6 (six) hours as needed (pain).      No current facility-administered medications for this visit.     Allergies  Allergen Reactions  . No Known Allergies       Review of Systems:   General:  normal appetite, reduced energy, no weight gain, no weight loss, no fever  Cardiac:  no chest pain with exertion, no chest pain at rest, has SOB with mil exertion, occasional resting SOB, no PND, has orthopnea, no palpitations, no arrhythmia, no atrial fibrillation, has LE edema, no dizzy spells, no syncope  Respiratory:  has shortness of breath, no home oxygen, no productive cough, has dry cough, has bronchitis, no wheezing, no hemoptysis, has asthma, no pain with inspiration or cough, no sleep apnea, no CPAP at night  GI:   no difficulty swallowing, no reflux, no frequent heartburn, no hiatal hernia, has abdominal pain, has constipation, no diarrhea, no hematochezia, no hematemesis, no melena  GU:   no dysuria,  no frequency, no urinary tract infection,  no hematuria,  no kidney stones, has chronic kidney disease  Vascular:  no pain suggestive of claudication, no pain in feet, no leg cramps, no varicose veins, no DVT, no non-healing foot ulcer  Neuro:   no stroke, no TIA's, no seizures, no headaches, no temporary blindness one eye,  no slurred speech, no peripheral neuropathy, no chronic pain, has instability of gait, no memory/cognitive dysfunction  Musculoskeletal: has arthritis, no joint swelling, no myalgias, no difficulty walking, normal mobility with walker  Skin:   no rash, no itching, has skin infections, no pressure sores or ulcerations  Psych:   no anxiety, no depression, no nervousness, no unusual recent stress  Eyes:   has blurry vision, no floaters, no recent vision changes,  wears glasses   ENT:   has hearing loss, has loose or painful teeth, no dentures, last saw dentist years ago  Hematologic:  has easy bruising, no abnormal bleeding, no clotting disorder, no frequent epistaxis  Endocrine:  has diabetes, does check CBG's at home           Physical Exam:   BP (!) 108/59   Pulse 89   Ht 4\' 10"  (1.473 m)   Wt 110 lb (49.9 kg)   SpO2 98%   BMI 22.99 kg/m   General:  Elderlty, frail-appearing woman in no distress  HEENT:  NCAT/PERLA, EOMI, oropharynx clear, teeth in poor condition with multiple missing and discolored teeth, obvious gum disease.  Neck:   no JVD, no bruits, no adenopathy or thyromegaly  Chest:   clear to auscultation, symmetrical breath sounds, no wheezes, no rhonchi   CV:   RRR, grade IV/VI crescendo/decrescendo murmur heard best at RUSB,  no diastolic murmur  Abdomen:  soft, non-tender, no masses or organomegaly  Extremities:  warm, well-perfused, pulses diminished in feet, no LE edema  Rectal/GU  Deferred  Neuro:   Grossly non-focal and symmetrical throughout  Skin:   Clean and dry, no rashes, no breakdown   Diagnostic Tests:      [facility]  [place]. [place],  [address]                            [phone]  ------------------------------------------------------------------- Transthoracic Echocardiography  (Report amended )  Patient:    Floria, Brandau MR #:       [id number] Study Date: 03/07/2017 Gender:     F Age:        82 Height:     147.3 cm Weight:     49.9 kg BSA:        1.44 m^2 Pt. Status: Room:   ATTENDING    [name], MD  SONOGRAPHER  Cindy Hazy, RDCS  PERFORMING   [facility]  Riley Kill, Katie  REFERRING    Joy, Katie  REFERRING    Marveen Reeks, Daniel  cc:  ------------------------------------------------------------------- LV EF: 65% -   70%  ------------------------------------------------------------------- Indications:      I35.9 Aortic Valve Disorder.  ------------------------------------------------------------------- History:   PMH:  Acquired from the patient and from the patient&'s chart.  PMH:  Anemia. Asthma. Chronic Kidney Disease. Coronary artery disease. Murmur. Shortness of Breath.  Risk factors: Hypertension. Diabetes mellitus. Dyslipidemia.  ------------------------------------------------------------------- Study Conclusions  - Left ventricle: The cavity size was normal. There was moderate   concentric hypertrophy. Systolic function was vigorous. The   estimated ejection fraction was in the range of 65% to 70%. Wall   motion was normal; there were no regional wall motion   abnormalities. Doppler parameters are consistent with abnormal   left ventricular relaxation (grade 1 diastolic dysfunction).   Doppler parameters are consistent with both elevated ventricular   end-diastolic filling pressure and elevated left atrial filling   pressure. - Aortic valve: Trileaflet; severely thickened, severely calcified   leaflets. Valve mobility was restricted. There was trivial   regurgitation. Mean gradient (S): 91 mm Hg. Peak gradient  (S):   131 mm Hg. - Ascending aorta: The ascending aorta was normal in size. - Mitral valve: Calcified annulus. Severely thickened, severely   calcified leaflets . Mobility was restricted. The findings are   consistent with severe stenosis. There was mild regurgitation.   Peak gradient (D): 23 mm Hg. - Left atrium: The atrium was severely dilated. - Right ventricle: The cavity size was normal. Wall thickness was   normal. Systolic function was normal. - Pulmonary arteries: Systolic pressure was within the normal   range. - Inferior vena cava: The vessel was normal in size.  Impressions:  - Normal LVEF. Severely elevated LVEDP.   Critical aortic stenosis with peak/mean transaortic gradients   131/91 mmHg.   Severe mitral stenosis with mean gradient 11 mmHg.  ------------------------------------------------------------------- Study data:   Study status:  Routine.  Procedure:  The patient reported no pain pre or post test. Transthoracic echocardiography for left ventricular function evaluation, for right ventricular function evaluation, and for assessment of valvular function. Image quality was adequate.  Study completion:  There were no complications.          Transthoracic echocardiography.  M-mode, complete 2D, spectral Doppler, and color Doppler.  Birthdate: Patient birthdate: 10/02/1945.  Age:  Patient is 71 yr old.  Sex: Gender: female.    BMI: 23 kg/m^2.  Blood pressure:     136/60 Patient status:  Outpatient.  Study date:  Study date: 03/07/2017.  Study time: 03:16 PM.  Location:  [facility]  -------------------------------------------------------------------  ------------------------------------------------------------------- Left ventricle:  The cavity size was normal. There was moderate concentric hypertrophy. Systolic function was vigorous. The estimated ejection fraction was in the range of 65% to 70%. Wall motion was normal; there were no regional wall  motion abnormalities. Doppler parameters are consistent with abnormal left ventricular relaxation (grade 1 diastolic dysfunction). Doppler parameters are consistent with both elevated ventricular end-diastolic filling pressure and elevated left atrial filling pressure.  ------------------------------------------------------------------- Aortic valve:   Trileaflet; severely thickened, severely calcified leaflets. Valve mobility was restricted.  Doppler:  Transvalvular velocity was within the normal range. There was no stenosis. There was trivial regurgitation.    VTI ratio of LVOT to aortic valve: 0.2. Valve area (VTI): 0.57 cm^2. Indexed valve area (VTI): 0.4 cm^2/m^2. Peak velocity ratio of LVOT to aortic valve: 0.19. Valve area (Vmax): 0.55 cm^2. Indexed valve area (Vmax): 0.38 cm^2/m^2. Mean velocity ratio of LVOT to aortic valve: 0.23. Valve area (Vmean): 0.65 cm^2. Indexed valve area (Vmean): 0.45 cm^2/m^2. Mean gradient (S): 91 mm Hg. Peak gradient (S): 131 mm Hg.  ------------------------------------------------------------------- Aorta:  Aortic root: The aortic root was normal in size. Ascending aorta: The ascending aorta was normal in size.  ------------------------------------------------------------------- Mitral valve:   Calcified annulus. Severely thickened, severely calcified leaflets . Mobility was restricted.  Doppler:   The findings are consistent with severe stenosis.   There was mild regurgitation.    Valve area by pressure half-time: 1.79 cm^2. Indexed valve area by pressure half-time: 1.25 cm^2/m^2. Valve area by continuity equation (using LVOT flow): 1.04 cm^2. Indexed valve area by continuity equation (using LVOT flow): 0.72 cm^2/m^2. Mean gradient (D): 11 mm Hg. Peak gradient (D): 23 mm Hg.  ------------------------------------------------------------------- Left atrium:  The atrium was severely  dilated.  ------------------------------------------------------------------- Right ventricle:  The cavity size was normal. Wall thickness was normal. Systolic function was normal.  ------------------------------------------------------------------- Pulmonic valve:    Structurally normal valve.   Cusp separation was normal.  Doppler:  Transvalvular velocity was within the normal range. There was no evidence for stenosis. There was no regurgitation.  ------------------------------------------------------------------- Tricuspid valve:   Structurally normal valve.    Doppler: Transvalvular velocity was within the normal range. There was no regurgitation.  ------------------------------------------------------------------- Pulmonary artery:   The main pulmonary artery was normal-sized. Systolic pressure was within the normal range.  ------------------------------------------------------------------- Right atrium:  The atrium was normal in size.  ------------------------------------------------------------------- Pericardium:  There was no pericardial effusion.  ------------------------------------------------------------------- Systemic veins: Inferior vena cava: The vessel was normal in size.  ------------------------------------------------------------------- Measurements   Left ventricle                            Value          Reference  LV ID, ED, PLAX chordal           (L)     36.3  mm       43 - 52  LV ID, ES, PLAX chordal           (L)     21.2  mm       23 - 38  LV fx shortening, PLAX chordal            42    %        >=29  LV PW thickness, ED  15.4  mm       ---------  IVS/LV PW ratio, ED                       0.94           <=1.3  Stroke volume, 2D                         61    ml       ---------  Stroke volume/bsa, 2D                     42    ml/m^2   ---------  LV e&', lateral                            4.17  cm/s     ---------  LV  E/e&', lateral                          36.93          ---------  LV e&', medial                             4.17  cm/s     ---------  LV E/e&', medial                           36.93          ---------  LV e&', average                            4.17  cm/s     ---------  LV E/e&', average                          36.93          ---------    Ventricular septum                        Value          Reference  IVS thickness, ED                         14.5  mm       ---------    LVOT                                      Value          Reference  LVOT ID, S                                19    mm       ---------  LVOT area                                 2.84  cm^2     ---------  LVOT ID  19    mm       ---------  LVOT peak velocity, S                     99.6  cm/s     ---------  LVOT mean velocity, S                     80.1  cm/s     ---------  LVOT VTI, S                               21.5  cm       ---------  LVOT peak gradient, S                     4     mm Hg    ---------  Stroke volume (SV), LVOT DP               61    ml       ---------  Stroke index (SV/bsa), LVOT DP            42.4  ml/m^2   ---------    Aortic valve                              Value          Reference  Aortic valve peak velocity, S             516   cm/s     ---------  Aortic valve mean velocity, S             351   cm/s     ---------  Aortic valve VTI, S                       108   cm       ---------  Aortic mean gradient, S                   57    mm Hg    ---------  Aortic peak gradient, S                   107   mm Hg    ---------  VTI ratio, LVOT/AV                        0.2            ---------  Aortic valve area, VTI                    0.57  cm^2     ---------  Aortic valve area/bsa, VTI                0.4   cm^2/m^2 ---------  Velocity ratio, peak, LVOT/AV             0.19           ---------  Aortic valve area, peak velocity          0.55  cm^2     ---------  Aortic  valve area/bsa, peak               0.38  cm^2/m^2 ---------  velocity  Velocity ratio, mean, LVOT/AV  0.23           ---------  Aortic valve area, mean velocity          0.65  cm^2     ---------  Aortic valve area/bsa, mean               0.45  cm^2/m^2 ---------  velocity    Aorta                                     Value          Reference  Aortic root ID, ED                        32    mm       ---------  Ascending aorta ID, A-P, S                33    mm       ---------    Left atrium                               Value          Reference  LA ID, A-P, ES                            53    mm       ---------  LA ID/bsa, A-P                    (H)     3.69  cm/m^2   <=2.2  LA volume, S                              69    ml       ---------  LA volume/bsa, S                          48    ml/m^2   ---------  LA volume, ES, 1-p A4C                    66    ml       ---------  LA volume/bsa, ES, 1-p A4C                45.9  ml/m^2   ---------  LA volume, ES, 1-p A2C                    72    ml       ---------  LA volume/bsa, ES, 1-p A2C                50.1  ml/m^2   ---------    Mitral valve                              Value          Reference  Mitral E-wave peak velocity               154   cm/s     ---------  Mitral A-wave peak velocity  202   cm/s     ---------  Mitral mean velocity, D                   150   cm/s     ---------  Mitral deceleration time          (H)     338   ms       150 - 230  Mitral pressure half-time                 126   ms       ---------  Mitral mean gradient, D                   10    mm Hg    ---------  Mitral peak gradient, D                   9     mm Hg    ---------  Mitral E/A ratio, peak                    0.8            ---------  Mitral valve area, PHT, DP                1.79  cm^2     ---------  Mitral valve area/bsa, PHT, DP            1.25  cm^2/m^2 ---------  Mitral valve area, LVOT                   1.04  cm^2      ---------  continuity  Mitral valve area/bsa, LVOT               0.72  cm^2/m^2 ---------  continuity  Mitral annulus VTI, D                     58.9  cm       ---------    Right ventricle                           Value          Reference  RV s&', lateral, S                         18.6  cm/s     ---------  Legend: (L)  and  (H)  mark values outside specified reference range.  ------------------------------------------------------------------- Lance Morin, M.D. 2018-11-13T07:39:12  Physicians   Panel Physicians Referring Physician Case Authorizing Physician  Burnell Blanks, MD (Primary)    Procedures   RIGHT/LEFT HEART CATH AND CORONARY ANGIOGRAPHY  Conclusion     Mid RCA to Dist RCA lesion, 40 %stenosed.  Prox RCA to Mid RCA lesion, 20 %stenosed.  Ost Cx to Prox Cx lesion, 70 %stenosed.  Ost 3rd Mrg to 3rd Mrg lesion, 40 %stenosed.  Prox LAD to Mid LAD lesion, 30 %stenosed.  Dist LAD lesion, 20 %stenosed.  Ost 1st Diag lesion, 40 %stenosed.  Ost 2nd Diag to 2nd Diag lesion, 30 %stenosed.  There is severe aortic valve stenosis.   1. Single vessel CAD with moderately severe, calcified stenosis in the proximal Circumflex artery 2. Mild non-obstructive disease in the RCA and LAD 3. Severe aortic stenosis (mean gradient 18.33mmHg, peak gradient 35 mmHg, AVA 1.26  cm2 ).   Recommendations: Will review films with the TAVR team. We may be able to manage her CAD medically. Will continue workup for TAVR.    Indications   Severe aortic stenosis [I35.0 (ICD-10-CM)]  Procedural Details/Technique   Technical Details Indication: 71 yo female with h/o DM, severe aortic stenosis. Pre-TAVR workup.   Procedure: The risks, benefits, complications, treatment options, and expected outcomes were discussed with the patient. The patient and/or family concurred with the proposed plan, giving informed consent. The patient was brought to the cath lab after  IV hydration was given. The patient was further sedated with Versed and Fentanyl. There was an IV catheter present in the right antecubital vein. This was prepped and draped. I then changed out this catheter for a % French sheath. Right heart cath performed with a balloon tipped catheter. The right wrist was prepped and draped in a sterile fashion. 1% lidocaine was used for local anesthesia. Using the modified Seldinger access technique, a 5 French sheath was placed in the right radial artery. 3 mg Verapamil was given through the sheath. 3000 units IV heparin was given. Standard diagnostic catheters were used to perform selective coronary angiography. I crossed the aortic valve with an AL-1 catheter and a straight wire. The sheath was removed from the right radial artery and a Terumo hemostasis band was applied at the arteriotomy site on the right wrist.     Estimated blood loss <50 mL.  During this procedure the patient was administered the following to achieve and maintain moderate conscious sedation: Versed 1 mg, Fentanyl 25 mcg, while the patient's heart rate, blood pressure, and oxygen saturation were continuously monitored. The period of conscious sedation was 32 minutes, of which I was present face-to-face 100% of this time.  Complications   Complications documented before study signed (02/09/2017 1:45 PM EDT)    RIGHT/LEFT HEART CATH AND CORONARY ANGIOGRAPHY   None Documented by Burnell Blanks, MD 02/09/2017 1:44 PM EDT  Time Range: Intra-procedure      Coronary Findings   Diagnostic  Dominance: Right  Left Anterior Descending  Prox LAD to Mid LAD lesion 30% stenosed  Prox LAD to Mid LAD lesion.  Dist LAD lesion 20% stenosed  Dist LAD lesion.  First Diagonal Branch  Vessel is small in size.  Ost 1st Diag lesion 40% stenosed  Ost 1st Diag lesion.  Second Diagonal Branch  Vessel is small in size.  Ost 2nd Diag to 2nd Diag lesion 30% stenosed  Ost 2nd Diag to 2nd Diag  lesion.  Ramus Intermedius  Vessel is small.  Left Circumflex  Vessel is moderate in size.  Ost Cx to Prox Cx lesion 70% stenosed  The lesion is calcified.  First Obtuse Marginal Branch  Vessel is small in size.  Second Obtuse Marginal Branch  Vessel is moderate in size.  Third Obtuse Marginal Branch  Vessel is small in size.  Ost 3rd Mrg to 3rd Mrg lesion 40% stenosed  Ost 3rd Mrg to 3rd Mrg lesion.  Right Coronary Artery  Prox RCA to Mid RCA lesion 20% stenosed  Prox RCA to Mid RCA lesion.  Mid RCA to Dist RCA lesion 40% stenosed  The lesion is calcified.  Intervention   No interventions have been documented.  Left Heart   Aortic Valve There is severe aortic valve stenosis.  Coronary Diagrams   Diagnostic Diagram       Implants     No implant documentation for this case.  MERGE Images  Show images for CARDIAC CATHETERIZATION   Link to Procedure Log   Procedure Log    Hemo Data    Most Recent Value  Fick Cardiac Output 5.17 L/min  Fick Cardiac Output Index 3.67 (L/min)/BSA  Aortic Mean Gradient 18.2 mmHg  Aortic Peak Gradient 35 mmHg  Aortic Valve Area 1.26  Aortic Value Area Index 0.89 cm2/BSA  RA A Wave 5 mmHg  RA V Wave 2 mmHg  RA Mean 2 mmHg  RV Systolic Pressure 39 mmHg  RV Diastolic Pressure 1 mmHg  RV EDP 3 mmHg  PA Systolic Pressure 35 mmHg  PA Diastolic Pressure 13 mmHg  PA Mean 22 mmHg  PW A Wave 20 mmHg  PW V Wave 19 mmHg  PW Mean 13 mmHg  AO Systolic Pressure 102 mmHg  AO Diastolic Pressure 50 mmHg  AO Mean 78 mmHg  LV Systolic Pressure 725 mmHg  LV Diastolic Pressure 4 mmHg  LV EDP 10 mmHg  Arterial Occlusion Pressure Extended Systolic Pressure 366 mmHg  Arterial Occlusion Pressure Extended Diastolic Pressure 56 mmHg  Arterial Occlusion Pressure Extended Mean Pressure 87 mmHg  Left Ventricular Apex Extended Systolic Pressure 440 mmHg  Left Ventricular Apex Extended Diastolic Pressure 5 mmHg  Left Ventricular Apex Extended EDP  Pressure 9 mmHg  QP/QS 1  TPVR Index 6 HRUI  TSVR Index 21.27 HRUI  PVR SVR Ratio 0.12  TPVR/TSVR Ratio 0.28   ADDENDUM REPORT: 03/01/2017 15:02  EXAM: OVER-READ INTERPRETATION  CT CHEST  The following report is an over-read performed by radiologist Dr. Samara Snide [facility] on 03/01/2017. This over-read does not include interpretation of cardiac or coronary anatomy or pathology. The coronary CTA interpretation by the cardiologist is attached.  COMPARISON:  None.  FINDINGS: Please see the separate report for the concurrent dedicated chest CT angiogram for further details regarding significant extracardiac findings in the chest. No acute consolidative airspace disease, lung masses or significant pulmonary nodules. No pneumothorax or pleural effusions. Mild goiter. Right adrenal 1.5 cm nodule with density 40 HU (series 9/image 70). No suspicious focal osseous lesions.  IMPRESSION: Please see the separate report for the concurrent dedicated chest CT angiogram for details regarding significant extracardiac findings in the chest.   Electronically Signed   By: Ilona Sorrel M.D.   On: 03/01/2017 15:02   Addended by Sharyn Blitz, MD on 03/01/2017 3:04 PM    Study Result   CLINICAL DATA:  Aortic Stenosis  EXAM: Cardiac TAVR CT  MEDICATIONS: None  TECHNIQUE: The patient was scanned on a Siemens Force 347 slice scanner. A 120 kV retrospective scan was triggered in the ascending thoracic aorta at 140 HU's. Gantry rotation speed was 250 msecs and collimation was .6 mm. No beta blockade or nitro were given. The 3D data set was reconstructed in 5% intervals of the R-R cycle. Systolic and diastolic phases were analyzed on a dedicated work station using MPR, MIP and VRT modes. The patient received 80 cc of contrast.  The patient was scanned on a Siemens Force 425 slice scanner. Gantry rotation speed was 250 msecs. Collimation was .6 mm. A  100 kV prospective scan was triggered in the ascending thoracic aorta at 140 HU's Full mA was used between 35% and 75% of the R-R interval. Average HR during the scan was 80 bpm. The 3D data set was interpreted on a dedicated work station using MPR, MIP and VRT modes. A total of 80cc of contrast was used for the entire study including  the chest CTA and run off. Patient had bicarbonate drip prior to procedure for CRF.  FINDINGS: Aortic Valve: Tri leaflet and moderately calcified with restricted leaflet motion.  Aorta: Moderate atherosclerotic disease with normal arch vessel origin  Sino-tubular Junction:  24 mm  Ascending Thoracic Aorta:  30 mm  Aortic Arch:  23 mm  Descending Thoracic Aorta:  21 mm  Sinus of Valsalva Measurements:  Non-coronary:  31 mm  Right - coronary:  30 mm  Left -   coronary:  30 mm  Coronary Artery Height above Annulus:  Left Main:  11.5 mm above annulus  Right Coronary:  16.4 mm above annulus  Virtual Basal Annulus Measurements:  Maximum / Minimum Diameter:  26.6 mm x 19.2 mm elliptical  Perimeter:  72.7 mm  Area:  296 mm2  Coronary Arteries:  Sufficient height above annulus for deployment  Optimum Fluoroscopic Angle for Delivery: LAO 17 degrees Caudal 9 degrees  IMPRESSION: 1) Calcified tri leaflet aortic valve with annular area of 396 mm2 suitable for a 23 mm Sapien 3 valve and perimeter of 72.7 mm suitable for a 26 mm Evolut Pro valve  2) Optimum angiographic angle for deployment LAO 17 degrees Caudal 9 degrees  3) Coronary arteries sufficient height above annulus for deployment  4) Normal aortic root and arch vessels with moderate calcific atherosclerosis  5) Significant nodular calcium at base of left annular sinus extending into the intervalvular fibrosa. Also appears to be significant thickening and restriction to motion of mitral valve suggest echo correlation  6) Moderate LAE with no LAA  thrombus  Jenkins Rouge  Electronically Signed: By: Jenkins Rouge M.D. On: 03/01/2017 14:57      CLINICAL DATA:  Severe aortic stenosis.  TAVR evaluation.  EXAM: CT ANGIOGRAPHY CHEST, ABDOMEN AND PELVIS  TECHNIQUE: Multidetector CT imaging through the chest, abdomen and pelvis was performed using the standard protocol during bolus administration of intravenous contrast. Multiplanar reconstructed images and MIPs were obtained and reviewed to evaluate the vascular anatomy.  CONTRAST:  80 cc Isovue 370 IV.  COMPARISON:  None.  FINDINGS: CTA CHEST FINDINGS  Cardiovascular: Mild cardiomegaly. There is diffuse thickening and coarse calcification of the aortic valve. No significant pericardial fluid/thickening. Left main, left anterior descending, left circumflex and right coronary atherosclerosis . Atherosclerotic nonaneurysmal thoracic aorta. No evidence of acute intramural hematoma, dissection, pseudoaneurysm or penetrating atherosclerotic ulcer. Aortic arch branch vessels are patent. Top-normal caliber main pulmonary artery (3.0 cm diameter). No central pulmonary emboli.  Mediastinum/Nodes: Multinodular goiter with several hypodense nodules throughout the bilateral thyroid lobes, largest 1.1 cm in the upper right thyroid lobe. Mildly patulous thoracic esophagus. Small fluid level in the lower thoracic esophagus. No pathologically enlarged axillary, mediastinal or hilar lymph nodes.  Lungs/Pleura: No pneumothorax. No pleural effusion. No acute consolidative airspace disease, lung masses or significant pulmonary nodules. Nonspecific diffuse bronchial wall thickening.  Musculoskeletal: No aggressive appearing focal osseous lesions. Moderate thoracic spondylosis.  CTA ABDOMEN AND PELVIS FINDINGS  Hepatobiliary: Normal liver size. Nonspecific heterogeneous liver parenchymal enhancement with numerous subcentimeter nodular foci of hyperenhancement throughout  the liver, probably representing transient hepatic attenuation differences due to the early phase of contrast. No definite liver masses. Normal gallbladder with no radiopaque cholelithiasis. There is pneumobilia in the left central bile ducts. No intrahepatic biliary ductal dilatation. Dilated common bile duct (11 mm diameter) with smooth distal tapering. No radiopaque choledocholithiasis.  Pancreas: No pancreatic mass.  No pancreatic duct dilation.  Spleen: Normal size. No mass.  Adrenals/Urinary  Tract: Right adrenal 1.5 cm nodule with density 103 HU (series 14/ image 81). No left adrenal nodules. No hydronephrosis. Small simple left renal cysts, largest 1.5 cm. Additional subcentimeter hypodense renal cortical lesions in both kidneys, too small to characterize, requiring no follow-up. Normal bladder.  Stomach/Bowel: Grossly normal stomach. Normal caliber small bowel with no small bowel wall thickening. Normal appendix. Mild scattered colonic diverticulosis, with no large bowel wall thickening or pericolonic fat stranding.  Vascular/Lymphatic: Atherosclerotic nonaneurysmal abdominal aorta. Patent portal, splenic and renal veins. No pathologically enlarged lymph nodes in the abdomen or pelvis.  Reproductive: Mildly enlarged myomatous uterus with coarsely calcified posterior uterine 4.8 cm subserosal fibroid. No adnexal masses.  Other: No pneumoperitoneum, ascites or focal fluid collection.  Musculoskeletal: No aggressive appearing focal osseous lesions. Mild lumbar spondylosis. Anterolisthesis at L4-5 measures 8 mm.  VASCULAR MEASUREMENTS PERTINENT TO TAVR:  AORTA:  Minimal Aortic Diameter -  13 x 13 mm (infrarenal abdominal aorta)  Severity of Aortic Calcification -  moderate  RIGHT PELVIS:  Right Common Iliac Artery -  Minimal Diameter - 7.2 x 5.1 mm  Tortuosity - mild-to-moderate  Calcification - moderate  There is a moderate focal  atherosclerotic stenosis in the right common iliac artery approximately 2 cm below the aortic bifurcation.  Right External Iliac Artery -  Minimal Diameter - 6.1 x 5.9 mm  Tortuosity - mild  Calcification - mild  Right Common Femoral Artery -  Minimal Diameter - 6.0 x 5.5 mm  Tortuosity - mild  Calcification - moderate to severe  LEFT PELVIS:  Left Common Iliac Artery -  Minimal Diameter - 8.1 x 7.2 mm  Tortuosity - moderate  Calcification - moderate  Left External Iliac Artery -  Minimal Diameter - 6.0 x 5.9 mm  Tortuosity - mild  Calcification - mild  Left Common Femoral Artery -  Minimal Diameter - 5.3 x 3.8 mm  Tortuosity - mild  Calcification - moderate to severe  There is early bifurcation of the left common femoral artery.  Review of the MIP images confirms the above findings.  IMPRESSION: 1. Vascular findings and measurements pertinent to potential TAVR procedure, as detailed above. 2. Severe thickening and calcification of the aortic valve, compatible with the reported clinical history of severe aortic stenosis. 3. Aortic Atherosclerosis (ICD10-I70.0). Left main and 3 vessel coronary atherosclerosis. Mild cardiomegaly. 4. 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 Follow-up adrenal protocol CT abdomen without and with IV contrast could be considered in 12 months. This recommendation follows ACR consensus guidelines: Management of Incidental Adrenal Masses: A White Paper of the ACR Incidental Findings Committee. J Am Coll Radiol 2017;14:1038-1044. 5. Pneumobilia, correlate for any history of sphincterotomy. Mild common bile duct dilation (11 mm diameter) without intrahepatic dilation. No radiopaque cholelithiasis or choledocholithiasis. Recommend correlation with serum bilirubin levels. MRI abdomen with MRCP without and with IV contrast could be considered for further evaluation  as clinically warranted. 6. Additional chronic findings as detailed.   Electronically Signed   By: Ilona Sorrel M.D.   On: 03/01/2017 14:58  Impression:  This 71 year old woman has stage D, severe, symptomatic aortic stenosis with New York Heart Association class III symptoms of exertional shortness of breath and fatigue consistent with chronic diastolic congestive heart failure.  She has recently been hospitalized twice with resting shortness of breath and lower extremity edema that was attributed to pneumonia but may have been acute exacerbations of congestive heart failure.  I have personally reviewed  her recent transthoracic echocardiogram done yesterday as well as her cardiac catheterization and CTA studies.  Her echocardiogram yesterday shows a trileaflet aortic valve with severe calcification, thickening, and reduced mobility of all 3 valve leaflets.  The mean gradient was measured at 91 mmHg with a peak gradient of 131 mmHg.  The dimensionless index was 0.19.  This is consistent with critical aortic stenosis.  She also has severely thickened and calcified mitral valve leaflets with a calcified mitral annulus and a mean gradient of 11 mmHg with a peak gradient of 23 mmHg consistent with severe mitral stenosis.  Her catheterization shows a 70% left circumflex stenosis with otherwise nonobstructive disease.  I think her coronary disease could be treated conservatively with medical therapy.  I think her critical aortic stenosis is best treated with aortic valve replacement.  Her mitral valve disease is significant but I do not think she is a candidate for open surgical double valve replacement due to her multiple comorbid medical conditions.  I think the best option for her is to treat her critical aortic stenosis with TAVR and continue to follow her mitral stenosis and coronary disease.  Her gated cardiac CTA shows anatomy suitable for TAVR using a Sapien 3 valve.  Her abdominal and pelvic CTA  shows pelvic vascular anatomy that is likely adequate for transfemoral insertion although she has significant atherosclerotic disease involving the common femoral arteries bilaterally.  She does have very poor dentition and has been scheduled for evaluation by Dr. Enrique Sack.  She will likely require extraction of her remaining teeth.  The patient and her uncle were counseled at length regarding treatment alternatives for management of severe symptomatic aortic stenosis. The risks and benefits of surgical intervention has been discussed in detail. Long-term prognosis with medical therapy was discussed. Alternative approaches such as conventional surgical aortic valve replacement, transcatheter aortic valve replacement, and palliative medical therapy were compared and contrasted at length. This discussion was placed in the context of the patient's own specific clinical presentation and past medical history. All of their questions have been addressed.   Following the decision to proceed with transcatheter aortic valve replacement, a discussion was held regarding what types of management strategies would be attempted intraoperatively in the event of life-threatening complications, including whether or not the patient would be considered a candidate for the use of cardiopulmonary bypass and/or conversion to open sternotomy for attempted surgical intervention. I don't think she would be a candidate for open sternotomy to treat any potential complications that arise and she understands that.  The patient has been advised of a variety of complications that might develop including but not limited to risks of death, stroke, paravalvular leak, aortic dissection or other major vascular complications, aortic annulus rupture, device embolization, cardiac rupture or perforation, mitral regurgitation, acute myocardial infarction, arrhythmia, heart block or bradycardia requiring permanent pacemaker placement, congestive heart  failure, respiratory failure, renal failure, pneumonia, infection, other late complications related to structural valve deterioration or migration, or other complications that might ultimately cause a temporary or permanent loss of functional independence or other long term morbidity. The patient provides full informed consent for the procedure as described and all questions were answered.    Plan:  The patient will have a dental evaluation and any required treatment.  She has been tentatively scheduled for transfemoral TAVR using a Sapien 3 valve on 03/22/2017.  She understands that this may need to be rescheduled depending on her dental evaluation and treatment.   I spent 45  minutes performing this consultation and > 50% of this time was spent face to face counseling and coordinating the care of this patient's critical aortic stenosis.   Gaye Pollack, MD 03/08/2017

## 2017-03-09 ENCOUNTER — Ambulatory Visit (HOSPITAL_COMMUNITY): Payer: Self-pay | Admitting: Dentistry

## 2017-03-09 ENCOUNTER — Encounter (HOSPITAL_COMMUNITY): Payer: Self-pay | Admitting: Dentistry

## 2017-03-09 VITALS — BP 123/47 | HR 82 | Temp 98.8°F

## 2017-03-09 DIAGNOSIS — K06010 Localized gingival recession, unspecified: Secondary | ICD-10-CM | POA: Diagnosis not present

## 2017-03-09 DIAGNOSIS — N184 Chronic kidney disease, stage 4 (severe): Secondary | ICD-10-CM | POA: Insufficient documentation

## 2017-03-09 DIAGNOSIS — M270 Developmental disorders of jaws: Secondary | ICD-10-CM | POA: Insufficient documentation

## 2017-03-09 DIAGNOSIS — E119 Type 2 diabetes mellitus without complications: Secondary | ICD-10-CM | POA: Insufficient documentation

## 2017-03-09 DIAGNOSIS — K036 Deposits [accretions] on teeth: Secondary | ICD-10-CM | POA: Diagnosis not present

## 2017-03-09 DIAGNOSIS — K083 Retained dental root: Secondary | ICD-10-CM | POA: Insufficient documentation

## 2017-03-09 DIAGNOSIS — D649 Anemia, unspecified: Secondary | ICD-10-CM | POA: Insufficient documentation

## 2017-03-09 DIAGNOSIS — K029 Dental caries, unspecified: Secondary | ICD-10-CM | POA: Insufficient documentation

## 2017-03-09 DIAGNOSIS — K0889 Other specified disorders of teeth and supporting structures: Secondary | ICD-10-CM | POA: Diagnosis not present

## 2017-03-09 DIAGNOSIS — M278 Other specified diseases of jaws: Secondary | ICD-10-CM

## 2017-03-09 DIAGNOSIS — J45909 Unspecified asthma, uncomplicated: Secondary | ICD-10-CM | POA: Insufficient documentation

## 2017-03-09 DIAGNOSIS — Z01818 Encounter for other preprocedural examination: Secondary | ICD-10-CM | POA: Diagnosis not present

## 2017-03-09 DIAGNOSIS — E785 Hyperlipidemia, unspecified: Secondary | ICD-10-CM | POA: Insufficient documentation

## 2017-03-09 DIAGNOSIS — I10 Essential (primary) hypertension: Secondary | ICD-10-CM | POA: Insufficient documentation

## 2017-03-09 DIAGNOSIS — M272 Inflammatory conditions of jaws: Secondary | ICD-10-CM | POA: Diagnosis not present

## 2017-03-09 DIAGNOSIS — M264 Malocclusion, unspecified: Secondary | ICD-10-CM

## 2017-03-09 DIAGNOSIS — I350 Nonrheumatic aortic (valve) stenosis: Secondary | ICD-10-CM | POA: Diagnosis not present

## 2017-03-09 DIAGNOSIS — K08409 Partial loss of teeth, unspecified cause, unspecified class: Secondary | ICD-10-CM

## 2017-03-09 DIAGNOSIS — K045 Chronic apical periodontitis: Secondary | ICD-10-CM | POA: Insufficient documentation

## 2017-03-09 DIAGNOSIS — K0530 Chronic periodontitis, unspecified: Secondary | ICD-10-CM | POA: Insufficient documentation

## 2017-03-09 DIAGNOSIS — K06020 Generalized gingival recession, unspecified: Secondary | ICD-10-CM

## 2017-03-09 DIAGNOSIS — K819 Cholecystitis, unspecified: Secondary | ICD-10-CM

## 2017-03-09 DIAGNOSIS — Z794 Long term (current) use of insulin: Secondary | ICD-10-CM

## 2017-03-09 HISTORY — DX: Cholecystitis, unspecified: K81.9

## 2017-03-09 NOTE — Progress Notes (Signed)
DENTAL CONSULTATION  Date of Consultation:  03/09/2017 Patient Name:   Sonya Carpenter Date of Birth:   02-21-1946 Medical Record Number: [id number]  VITALS: BP (!) 123/47 (BP Location: Left Arm)   Pulse 82   Temp 98.8 F (37.1 C) (Oral)   CHIEF COMPLAINT: Patient referred by Dr. Roxy Manns for dental consultation.  HPI: Sonya Carpenter is a 71 year old female recently diagnosed with severe aortic stenosis.  Patient with anticipated TAVR procedure.  Patient is now seen as part of a medically necessary pre-heart valve surgery dental protocol examination to rule out dental infection that may affect the patient's systemic health and anticipated heart valve surgery.    Patient currently denies acute toothaches, swelling, or abscesses.  The patient has had some gum sensitivity involving the upper right quadrant teeth.  This is a dull pain that lasts for minutes only.  The patient denies having a primary dentist.  The patient last had a dental cleaning at the dental hygiene clinic associated with the [employer] [facility] in approximately 2016.  Patient was informed that she had "bad teeth" but did not follow-up with a dentist for treatment at that time.  Patient denies having partial dentures.  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  PROBLEM LIST: Patient Active Problem List   Diagnosis Date Noted  . Severe aortic stenosis     Priority: High  . CKD (chronic kidney disease) stage 4, GFR 15-29 ml/min (HCC) 03/09/2017  . Cholecystitis 03/09/2017  . Hypertension 03/09/2017  . Hyperlipidemia 03/09/2017  . Type 2 diabetes mellitus without complication, with long-term current use of insulin ([facility]) 03/09/2017  . Anemia 03/09/2017  . Asthma 03/09/2017    PMH: Past Medical History:  Diagnosis Date  . Anemia   . Aortic stenosis   . Aortic valve disorder   . Asthma   . CAD (coronary artery disease)   . CKD (chronic kidney disease), stage IV ([place])   . Edema   . Gallbladder disease   .  Heart murmur   . Hyperlipidemia   . Hypertension   . Other seasonal allergic rhinitis   . SOB (shortness of breath)   . Vitamin D deficiency     PSH: Past Surgical History:  Procedure Laterality Date  . BREAST CYST EXCISION Right   . COLONSCOPY    . GALLBLADDER SURGERY    . KNEE SURGERY    . REFRACTIVE SURGERY    . TONSILLECTOMY      ALLERGIES: Allergies  Allergen Reactions  . No Known Allergies     MEDICATIONS: Current Outpatient Medications  Medication Sig Dispense Refill  . acetaminophen (TYLENOL) 500 MG tablet Take 500 mg by mouth every 6 (six) hours as needed for mild pain or moderate pain.    Marland Kitchen albuterol (2.5 MG/3ML) 0.083% NEBU 3 mL, albuterol (5 MG/ML) 0.5% NEBU 0.5 mL Inhale 1 mg into the lungs 3 (three) times daily as needed (shortness of breath).     Marland Kitchen albuterol (PROVENTIL HFA;VENTOLIN HFA) 108 (90 Base) MCG/ACT inhaler Inhale into the lungs every 6 (six) hours as needed for wheezing or shortness of breath.    Marland Kitchen amLODipine (NORVASC) 5 MG tablet Take 5 mg by mouth daily.    Marland Kitchen aspirin EC 81 MG tablet Take 81 mg by mouth daily.    Marland Kitchen atorvastatin (LIPITOR) 20 MG tablet Take 20 mg by mouth daily.    . budesonide-formoterol (SYMBICORT) 160-4.5 MCG/ACT inhaler Inhale 2 puffs into the lungs 2 (two) times daily.    Marland Kitchen  cetirizine (ZYRTEC) 10 MG tablet Take 10 mg by mouth daily.    Marland Kitchen Dextran 70-Hypromellose (ARTIFICIAL TEARS) 0.1-0.3 % SOLN Place 1 drop into both eyes 2 (two) times daily.     Mariane Baumgarten Calcium (STOOL SOFTENER PO) Take 1 capsule by mouth 2 (two) times daily.    . ferrous sulfate 325 (65 FE) MG EC tablet Take 325 mg by mouth 2 (two) times daily.     . furosemide (LASIX) 40 MG tablet Take 1 tablet (40 mg total) by mouth daily. 30 tablet 3  . ipratropium (ATROVENT) 0.02 % nebulizer solution Take 0.5 mg by nebulization 2 (two) times daily.    Marland Kitchen LANTUS SOLOSTAR 100 UNIT/ML Solostar Pen Inject 20 Units into the skin every morning.     Marland Kitchen losartan (COZAAR) 100 MG  tablet Take 50 mg by mouth daily.     Marland Kitchen lubiprostone (AMITIZA) 8 MCG capsule Take 8 mcg by mouth daily.    . montelukast (SINGULAIR) 10 MG tablet Take 10 mg by mouth at bedtime.    . Multiple Vitamins-Minerals (MULTIVITAMIN WITH MINERALS) tablet Take 1 tablet by mouth daily.    . potassium chloride (K-DUR) 10 MEQ tablet Take 1 tablet (10 mEq total) by mouth daily. 30 tablet 3  . sodium chloride (OCEAN) 0.65 % SOLN nasal spray Place 1 spray into both nostrils 2 (two) times daily as needed for congestion.     . traMADol (ULTRAM) 50 MG tablet Take 50 mg by mouth every 6 (six) hours as needed (pain).      No current facility-administered medications for this visit.     LABS: Lab Results  Component Value Date   WBC 6.8 02/04/2017   HGB 12.7 02/04/2017   HCT 39.6 02/04/2017   MCV 92 02/04/2017   PLT 117 (L) 02/04/2017      Component Value Date/Time   NA 138 02/09/2017 1102   NA 142 02/04/2017 1226   K 3.9 02/09/2017 1102   CL 107 02/09/2017 1102   CO2 24 02/09/2017 1102   GLUCOSE 149 (H) 02/09/2017 1102   BUN 49 (H) 02/09/2017 1102   BUN 51 (H) 02/04/2017 1226   CREATININE 1.86 (H) 02/09/2017 1102   CALCIUM 9.1 02/09/2017 1102   GFRNONAA 26 (L) 02/09/2017 1102   GFRAA 31 (L) 02/09/2017 1102   Lab Results  Component Value Date   INR 1.1 02/04/2017   No results found for: PTT  SOCIAL HISTORY: Social History   Socioeconomic History  . Marital status: Widowed    Spouse name: Not on file  . Number of children: 0  . Years of education: Not on file  . Highest education level: Not on file  Social Needs  . Financial resource strain: Not on file  . Food insecurity - worry: Not on file  . Food insecurity - inability: Not on file  . Transportation needs - medical: Not on file  . Transportation needs - non-medical: Not on file  Occupational History  . Not on file  Tobacco Use  . Smoking status: Never Smoker  . Smokeless tobacco: Never Used  Substance and Sexual Activity  .  Alcohol use: No    Frequency: Never  . Drug use: No  . Sexual activity: No  Other Topics Concern  . Not on file  Social History Narrative   Here with Uncle and [place].    FAMILY HISTORY: Family History  Problem Relation Age of Onset  . Hypertension Mother   . Kidney failure Mother   .  Heart disease Mother   . Hypertension Father   . Heart disease Father   . Diabetes Father   . Cancer Sister     REVIEW OF SYSTEMS: Reviewed with the patient as per History of present illness. Psych: Patient has some dental phobia and does not like pain.    DENTAL HISTORY: CHIEF COMPLAINT: Patient referred by Dr. Roxy Manns for dental consultation.  HPI: Sonya Carpenter is a 71 year old female recently diagnosed with severe aortic stenosis.  Patient with anticipated TAVR procedure.  Patient is now seen as part of a medically necessary pre-heart valve surgery dental protocol examination to rule out dental infection that may affect the patient's systemic health and anticipated heart valve surgery.    Patient currently denies acute toothaches, swelling, or abscesses.  The patient has had some gum sensitivity involving the upper right quadrant teeth.  This is a dull pain that lasts for minutes only.  The patient denies having a primary dentist.  The patient last had a dental cleaning at the dental hygiene clinic associated with the [facility] in approximately 2016.  Patient was informed that she had "bad teeth" but did not follow-up with a dentist for treatment at that time.  Patient denies having partial dentures.  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  DENTAL EXAMINATION: GENERAL: The patient is a well-developed, well-nourished female no acute distress. HEAD AND NECK: There is no palpable neck lymphadenopathy.  The patient denies acute TMJ symptoms. INTRAORAL EXAM: The patient has normal saliva.  The patient has bilateral mandibular lingual tori and lower lingual lateral  exostoses. DENTITION: The patient is missing tooth numbers 1, 12, 13, 14, 16, 17, 18, 29, 30, and 32.  Retained root segments in the area of tooth numbers 2, 3, 10, 11, 19, 21, 23, 25, 26, 27, and 28.   PERIODONTAL: The patient has chronic periodontitis with plaque and calculus accumulations, gingival recession, and tooth mobility.  There is moderate to severe bone loss. DENTAL CARIES/SUBOPTIMAL RESTORATIONS: There are rampant dental caries affecting almost all remaining teeth per dental charting form. ENDODONTIC: The patient currently denies acute phlebitis symptoms.  The patient does have multiple areas of periapical pathology and radiolucency. CROWN AND BRIDGE: There are no coronal bridge restorations. PROSTHODONTIC: Patient denies having partial dentures. OCCLUSION: Patient is a poor occlusal scheme secondary to multiple missing teeth, multiple retained root segments, likely placed in a missing teeth with dental prostheses.  RADIOGRAPHIC INTERPRETATION: Orthopantogram was taken today and supplemented with a full series of periapical radiographs.  The orthopantogram is suboptimal secondary to patient movement. There are multiple missing teeth.  There are multiple retained root segments.  There are rampant dental caries noted.  There are radiopacities consistent with bilateral mandibular lingual tori and lateral exostoses.  There is supra eruption drifting of the unopposed teeth into the edentulous areas.  There are multiple periapical radiolucencies.   ASSESSMENTS: 1.  Severe aortic stenosis 2.  Pre-heart valve surgery dental protocol 3.  Chronic apical periodontitis 4.  Dental caries 5.  Bilateral mandibular lingual tori 6.  Bilateral mandibular lingual exostoses 7.  Chronic periodontitis of bone loss 8.  Gingival recession 9.  Tooth mobility 10.  Accretions 11.  Multiple missing teeth  12.  Multiple retained root segments 13.   No history of partial dentures 14.  Poor occlusal scheme  and malocclusion 15.  History of dental phobia 16.  Risk for complications up to and including death with invasive dental procedures in the operating  room with general anesthesia secondary to respiratory and cardiovascular compromise.   PLAN/RECOMMENDATIONS: 1. I discussed the risks, benefits, and complications of various treatment options with the patient in relationship to her medical and dental conditions, anticipated heart valve surgery, and risk for endocarditis.  We discussed various treatment options to include no treatment, multiple extractions with alveoloplasty, pre-prosthetic surgery as indicated, periodontal therapy, dental restorations, root canal therapy, crown and bridge therapy, implant therapy, and replacement of missing teeth as indicated. The patient currently wishes to proceed with extraction of remaining teeth with alveoloplasty and pre-prosthetic surgery as needed in the operating room and general anesthesia on 03/14/2017 7:30 AM at [facility].  Patient will then follow with the TAVR procedure the following week on 03/22/2017 at the discretion of Drs. Burt Knack and [employer].  Patient will also need to follow-up with a dentist of her choice for fabrication of upper and lower complete dentures after adequate healing from the oral surgery and must be medically stable from her anticipated heart valve surgery.   2. Discussion of findings with medical team and coordination of future medical and dental care as needed.  I spent in excess of  120 minutes during the conduct of this consultation and >50% of this time involved direct face-to-face encounter for counseling and/or coordination of the patient's care.    Lenn Cal, DDS

## 2017-03-09 NOTE — Patient Instructions (Signed)
[facility]    Department of Dental Medicine     DR. Olia Hinderliter      HEART VALVES AND MOUTH CARE:  FACTS:   If you have any infection in your mouth, it can infect your heart valve.  If you heart valve is infected, you will be seriously ill.  Infections in the mouth can be SILENT and do not always cause pain.  Examples of infections in the mouth are gum disease, dental cavities, and abscesses.  Some possible signs of infection are: Bad breath, bleeding gums, or teeth that are sensitive to sweets, hot, and/or cold. There are many other signs as well.  WHAT YOU HAVE TO DO:   Brush your teeth after meals and at bedtime. Spend at least 2 minutes brushing well, especially behind your back teeth and all around your teeth that stand alone. Brush at the gumline also.  Do not go to bed without brushing your teeth and flossing.  If you gums bleed when you brush or floss, do NOT stop brushing or flossing. It usually means that your gums need more attention and better cleaning.   If your Dentist or Dr. Stacey Sago gave you a prescription mouthwash to use, make sure to use it as directed. If you run out of the medication, get a refill at the pharmacy.   If you were given any other medications or directions by your Dentist, please follow them. If you did not understand the directions or forget what you were told, please call. We will be happy to refresh her memory.  If you need antibiotics before dental procedures, make sure you take them one hour prior to every dental visit as directed.   Get a dental checkup every 4-6 months in order to keep your mouth healthy, or to find and treat any new infection. You will most likely need your teeth cleaned or gums treated at the same time.  If you are not able to come in for your scheduled appointment, call your Dentist as soon as possible to reschedule.  If you have a problem in between dental visits, call your Dentist.  

## 2017-03-11 ENCOUNTER — Other Ambulatory Visit: Payer: Self-pay

## 2017-03-11 ENCOUNTER — Encounter (HOSPITAL_COMMUNITY): Payer: Self-pay | Admitting: *Deleted

## 2017-03-11 NOTE — Progress Notes (Signed)
Patient gave verbal premission for me to verify her medical history with her niece Tammy  I spoke with tammy and gave her pre-op instructions tammy had not further questions  7 days prior to surgery STOP taking any Aspirin(unless otherwise instructed by your surgeon), Aleve, Naproxen, Ibuprofen, Motrin, Advil, Goody's, BC's, all herbal medications, fish oil, and all vitamins   Instructed tammy to call Dr. Ritta Slot office for aspirin instructions I called Lauren TAVR nurse for those instructions as well  tammy denied any problems with congestion or a broken nose with the patient  Patient is diabetic and checks blood sugar about 2-3 times a day Tammy was unable to provide a fasting sugars

## 2017-03-11 NOTE — Progress Notes (Signed)
Anesthesia Chart Review:  Pt is a same day work up.   Pt is a 71 year old female scheduled for multiple extractions with alveoloplasty, preprostatic surgery as needed 03/14/2017 with Teena Dunk, DDS  Pt for pre-TAVR dental extractions. Also has single vessel CAD and severe mitral stenosis, but note 03/08/17 by Gilford Raid, MD indicates CAD and mitral stenosis to be managed medically due to pt's co-morbid medical conditions.   - PCP is Michell Heinrich, DO - Cardiologist is Delanna Notice, MD  PMH includes:  CAD (70% CX, medically managed), severe aortic stenosis, severe mitral stenosis, HTN, hyperlipidemia, asthma, CKD (stage IV), anemia. Never smoker. BMI 23.  Medications include: Albuterol, amlodipine, ASA 81 mg, Lipitor, Symbicort, iron, Lasix, Atrovent, Lantus, losartan, potassium  Labs will be obtained day of surgery  EKG 02/04/17: NSR. ST and T-wave abnormality, consider inferior ischemia.  Echo 03/08/17:  - Left ventricle: The cavity size was normal. There was moderate concentric hypertrophy. Systolic function was vigorous. The estimated ejection fraction was in the range of 65% to 70%. Wall motion was normal; there were no regional wall motion abnormalities. Doppler parameters are consistent with abnormal left ventricular relaxation (grade 1 diastolic dysfunction). Doppler parameters are consistent with both elevated ventricular end-diastolic filling pressure and elevated left atrial filling pressure. - Aortic valve: Trileaflet; severely thickened, severely calcified leaflets. Valve mobility was restricted. There was trivial regurgitation. Mean gradient (S): 91 mm Hg. Peak gradient (S): 131 mm Hg. - Ascending aorta: The ascending aorta was normal in size. - Mitral valve: Calcified annulus. Severely thickened, severely calcified leaflets . Mobility was restricted. The findings are consistent with severe stenosis. There was mild regurgitation. Peak gradient (D): 23 mm Hg. - Left atrium:  The atrium was severely dilated. - Right ventricle: The cavity size was normal. Wall thickness was normal. Systolic function was normal. - Pulmonary arteries: Systolic pressure was within the normal range. - Inferior vena cava: The vessel was normal in size. - Impressions: Normal LVEF. Severely elevated LVEDP. Critical aortic stenosis with peak/mean transaortic gradients 131/91 mmHg. Severe mitral stenosis with mean gradient 11 mmHg.  Carotid duplex 03/01/17:  - Right Carotid: There is evidence in the right ICA of a 1-39% stenosis. - Left Carotid: There is evidence in the left ICA of a 1-39% stenosis. - Vertebrals: Both vertebral arteries were patent with antegrade flow.  R/L cardiac cath 02/09/17:   Mid RCA to Dist RCA lesion, 40 %stenosed.  Prox RCA to Mid RCA lesion, 20 %stenosed.  Ost Cx to Prox Cx lesion, 70 %stenosed.  Ost 3rd Mrg to 3rd Mrg lesion, 40 %stenosed.  Prox LAD to Mid LAD lesion, 30 %stenosed.  Dist LAD lesion, 20 %stenosed.  Ost 1st Diag lesion, 40 %stenosed.  Ost 2nd Diag to 2nd Diag lesion, 30 %stenosed.  There is severe aortic valve stenosis. 1. Single vessel CAD with moderately severe, calcified stenosis in the proximal Circumflex artery 2. Mild non-obstructive disease in the RCA and LAD 3. Severe aortic stenosis (mean gradient 18.52mmHg, peak gradient 35 mmHg, AVA 1.26 cm2 ).   If labs acceptable day of surgery, I anticipate pt can proceed with surgery as scheduled.   Willeen Cass, FNP-BC [facility]/Anesthesiology Phone: [phone] 03/11/2017 2:35 PM

## 2017-03-13 NOTE — Anesthesia Preprocedure Evaluation (Addendum)
Anesthesia Evaluation  Patient identified by MRN, date of birth, ID band Patient awake    Reviewed: Allergy & Precautions, H&P , NPO status , Patient's Chart, lab work & pertinent test results  Airway Mallampati: II  TM Distance: >3 FB Neck ROM: Full    Dental no notable dental hx. (+) Poor Dentition, Dental Advisory Given   Pulmonary asthma , COPD,  COPD inhaler,    Pulmonary exam normal breath sounds clear to auscultation       Cardiovascular Exercise Tolerance: Good hypertension, Pt. on medications + Valvular Problems/Murmurs AS  Rhythm:Regular Rate:Normal + Systolic murmurs    Neuro/Psych negative neurological ROS  negative psych ROS   GI/Hepatic negative GI ROS, Neg liver ROS,   Endo/Other  diabetes, Type 2  Renal/GU Renal InsufficiencyRenal disease  negative genitourinary   Musculoskeletal  (+) Arthritis ,   Abdominal   Peds  Hematology negative hematology ROS (+) anemia ,   Anesthesia Other Findings   Reproductive/Obstetrics negative OB ROS                            Anesthesia Physical Anesthesia Plan  ASA: III  Anesthesia Plan: General   Post-op Pain Management:    Induction: Intravenous  PONV Risk Score and Plan: 4 or greater and Ondansetron, Dexamethasone and Midazolam  Airway Management Planned: Nasal ETT and Video Laryngoscope Planned  Additional Equipment: Arterial line  Intra-op Plan:   Post-operative Plan: Extubation in OR  Informed Consent: I have reviewed the patients History and Physical, chart, labs and discussed the procedure including the risks, benefits and alternatives for the proposed anesthesia with the patient or authorized representative who has indicated his/her understanding and acceptance.   Dental advisory given  Plan Discussed with: CRNA  Anesthesia Plan Comments:         Anesthesia Quick Evaluation

## 2017-03-14 ENCOUNTER — Encounter (HOSPITAL_COMMUNITY)
Admission: RE | Disposition: A | Discharge status: Home / Self Care | Payer: Self-pay | Source: Ambulatory Visit | Attending: Cardiovascular Disease

## 2017-03-14 ENCOUNTER — Ambulatory Visit (HOSPITAL_COMMUNITY): Payer: Medicare HMO | Admitting: Emergency Medicine

## 2017-03-14 ENCOUNTER — Observation Stay (HOSPITAL_COMMUNITY)
Admission: RE | Admit: 2017-03-14 | Discharge: 2017-03-16 | Disposition: A | Discharge status: Home / Self Care | Payer: Medicare HMO | Source: Ambulatory Visit | Attending: Cardiovascular Disease | Admitting: Cardiovascular Disease

## 2017-03-14 ENCOUNTER — Encounter (HOSPITAL_COMMUNITY): Payer: Self-pay | Admitting: *Deleted

## 2017-03-14 DIAGNOSIS — Z79899 Other long term (current) drug therapy: Secondary | ICD-10-CM | POA: Diagnosis not present

## 2017-03-14 DIAGNOSIS — K029 Dental caries, unspecified: Secondary | ICD-10-CM

## 2017-03-14 DIAGNOSIS — I080 Rheumatic disorders of both mitral and aortic valves: Secondary | ICD-10-CM | POA: Diagnosis not present

## 2017-03-14 DIAGNOSIS — I2510 Atherosclerotic heart disease of native coronary artery without angina pectoris: Secondary | ICD-10-CM | POA: Diagnosis not present

## 2017-03-14 DIAGNOSIS — I5032 Chronic diastolic (congestive) heart failure: Secondary | ICD-10-CM | POA: Insufficient documentation

## 2017-03-14 DIAGNOSIS — D631 Anemia in chronic kidney disease: Secondary | ICD-10-CM | POA: Insufficient documentation

## 2017-03-14 DIAGNOSIS — Z01818 Encounter for other preprocedural examination: Secondary | ICD-10-CM

## 2017-03-14 DIAGNOSIS — D649 Anemia, unspecified: Secondary | ICD-10-CM | POA: Diagnosis not present

## 2017-03-14 DIAGNOSIS — N179 Acute kidney failure, unspecified: Secondary | ICD-10-CM

## 2017-03-14 DIAGNOSIS — Z8249 Family history of ischemic heart disease and other diseases of the circulatory system: Secondary | ICD-10-CM | POA: Insufficient documentation

## 2017-03-14 DIAGNOSIS — K0530 Chronic periodontitis, unspecified: Secondary | ICD-10-CM

## 2017-03-14 DIAGNOSIS — E559 Vitamin D deficiency, unspecified: Secondary | ICD-10-CM | POA: Insufficient documentation

## 2017-03-14 DIAGNOSIS — E1122 Type 2 diabetes mellitus with diabetic chronic kidney disease: Secondary | ICD-10-CM | POA: Diagnosis not present

## 2017-03-14 DIAGNOSIS — I130 Hypertensive heart and chronic kidney disease with heart failure and stage 1 through stage 4 chronic kidney disease, or unspecified chronic kidney disease: Secondary | ICD-10-CM | POA: Diagnosis not present

## 2017-03-14 DIAGNOSIS — Z794 Long term (current) use of insulin: Secondary | ICD-10-CM | POA: Diagnosis not present

## 2017-03-14 DIAGNOSIS — Z7722 Contact with and (suspected) exposure to environmental tobacco smoke (acute) (chronic): Secondary | ICD-10-CM | POA: Insufficient documentation

## 2017-03-14 DIAGNOSIS — I10 Essential (primary) hypertension: Secondary | ICD-10-CM | POA: Diagnosis present

## 2017-03-14 DIAGNOSIS — M278 Other specified diseases of jaws: Secondary | ICD-10-CM

## 2017-03-14 DIAGNOSIS — M898X8 Other specified disorders of bone, other site: Secondary | ICD-10-CM | POA: Insufficient documentation

## 2017-03-14 DIAGNOSIS — J449 Chronic obstructive pulmonary disease, unspecified: Secondary | ICD-10-CM | POA: Insufficient documentation

## 2017-03-14 DIAGNOSIS — Z7982 Long term (current) use of aspirin: Secondary | ICD-10-CM | POA: Insufficient documentation

## 2017-03-14 DIAGNOSIS — K0889 Other specified disorders of teeth and supporting structures: Secondary | ICD-10-CM | POA: Insufficient documentation

## 2017-03-14 DIAGNOSIS — M270 Developmental disorders of jaws: Secondary | ICD-10-CM | POA: Diagnosis not present

## 2017-03-14 DIAGNOSIS — N184 Chronic kidney disease, stage 4 (severe): Secondary | ICD-10-CM | POA: Diagnosis present

## 2017-03-14 DIAGNOSIS — I6523 Occlusion and stenosis of bilateral carotid arteries: Secondary | ICD-10-CM | POA: Insufficient documentation

## 2017-03-14 DIAGNOSIS — E785 Hyperlipidemia, unspecified: Secondary | ICD-10-CM | POA: Diagnosis not present

## 2017-03-14 DIAGNOSIS — K045 Chronic apical periodontitis: Secondary | ICD-10-CM | POA: Insufficient documentation

## 2017-03-14 DIAGNOSIS — I350 Nonrheumatic aortic (valve) stenosis: Secondary | ICD-10-CM

## 2017-03-14 DIAGNOSIS — E119 Type 2 diabetes mellitus without complications: Secondary | ICD-10-CM

## 2017-03-14 DIAGNOSIS — M1990 Unspecified osteoarthritis, unspecified site: Secondary | ICD-10-CM | POA: Insufficient documentation

## 2017-03-14 DIAGNOSIS — J45909 Unspecified asthma, uncomplicated: Secondary | ICD-10-CM | POA: Diagnosis present

## 2017-03-14 DIAGNOSIS — K083 Retained dental root: Secondary | ICD-10-CM

## 2017-03-14 HISTORY — DX: Chronic obstructive pulmonary disease, unspecified: J44.9

## 2017-03-14 HISTORY — PX: MULTIPLE EXTRACTIONS WITH ALVEOLOPLASTY: SHX5342

## 2017-03-14 HISTORY — DX: Unspecified osteoarthritis, unspecified site: M19.90

## 2017-03-14 LAB — BASIC METABOLIC PANEL
ANION GAP: 12 (ref 5–15)
ANION GAP: 9 (ref 5–15)
BUN: 98 mg/dL — AB (ref 6–20)
BUN: 86 mg/dL — ABNORMAL HIGH (ref 6–20)
CALCIUM: 9.7 mg/dL (ref 8.9–10.3)
CHLORIDE: 107 mmol/L (ref 101–111)
CHLORIDE: 110 mmol/L (ref 101–111)
CO2: 18 mmol/L — ABNORMAL LOW (ref 22–32)
CO2: 19 mmol/L — AB (ref 22–32)
CREATININE: 2.61 mg/dL — AB (ref 0.44–1.00)
Calcium: 10.2 mg/dL (ref 8.9–10.3)
Creatinine, Ser: 2.72 mg/dL — ABNORMAL HIGH (ref 0.44–1.00)
GFR calc Af Amer: 20 mL/min — ABNORMAL LOW (ref >60)
GFR calc non Af Amer: 17 mL/min — ABNORMAL LOW (ref >60)
GFR, EST AFRICAN AMERICAN: 19 mL/min — AB (ref >60)
GFR, EST NON AFRICAN AMERICAN: 17 mL/min — AB (ref >60)
GLUCOSE: 162 mg/dL — AB (ref 65–99)
Glucose, Bld: 103 mg/dL — ABNORMAL HIGH (ref 65–99)
POTASSIUM: 4.7 mmol/L (ref 3.5–5.1)
POTASSIUM: 5.4 mmol/L — AB (ref 3.5–5.1)
SODIUM: 137 mmol/L (ref 135–145)
Sodium: 138 mmol/L (ref 135–145)

## 2017-03-14 LAB — CBC
HEMATOCRIT: 25.6 % — AB (ref 36.0–46.0)
HEMATOCRIT: 30.7 % — AB (ref 36.0–46.0)
Hemoglobin: 8.5 g/dL — ABNORMAL LOW (ref 12.0–15.0)
Hemoglobin: 10.2 g/dL — ABNORMAL LOW (ref 12.0–15.0)
MCH: 30 pg (ref 26.0–34.0)
MCH: 29.4 pg (ref 26.0–34.0)
MCHC: 33.2 g/dL (ref 30.0–36.0)
MCHC: 33.2 g/dL (ref 30.0–36.0)
MCV: 90.5 fL (ref 78.0–100.0)
MCV: 88.5 fL (ref 78.0–100.0)
PLATELETS: 121 10*3/uL — AB (ref 150–400)
PLATELETS: 112 10*3/uL — AB (ref 150–400)
RBC: 2.83 MIL/uL — AB (ref 3.87–5.11)
RBC: 3.47 MIL/uL — AB (ref 3.87–5.11)
RDW: 14.9 % (ref 11.5–15.5)
RDW: 14.6 % (ref 11.5–15.5)
WBC: 6.8 10*3/uL (ref 4.0–10.5)
WBC: 8.3 10*3/uL (ref 4.0–10.5)

## 2017-03-14 LAB — GLUCOSE, CAPILLARY
GLUCOSE-CAPILLARY: 100 mg/dL — AB (ref 65–99)
GLUCOSE-CAPILLARY: 186 mg/dL — AB (ref 65–99)
GLUCOSE-CAPILLARY: 176 mg/dL — AB (ref 65–99)
GLUCOSE-CAPILLARY: 94 mg/dL (ref 65–99)
Glucose-Capillary: 138 mg/dL — ABNORMAL HIGH (ref 65–99)

## 2017-03-14 LAB — HEMOGLOBIN AND HEMATOCRIT, BLOOD
HCT: 28.5 % — ABNORMAL LOW (ref 36.0–46.0)
HEMOGLOBIN: 9.5 g/dL — AB (ref 12.0–15.0)

## 2017-03-14 LAB — HEMOGLOBIN A1C
Hgb A1c MFr Bld: 8 % — ABNORMAL HIGH (ref 4.8–5.6)
MEAN PLASMA GLUCOSE: 182.9 mg/dL

## 2017-03-14 LAB — PREPARE RBC (CROSSMATCH)

## 2017-03-14 LAB — PROTIME-INR
INR: 1.23
Prothrombin Time: 15.4 seconds — ABNORMAL HIGH (ref 11.4–15.2)

## 2017-03-14 LAB — ABO/RH: ABO/RH(D): A POS

## 2017-03-14 SURGERY — MULTIPLE EXTRACTION WITH ALVEOLOPLASTY
Anesthesia: General | Site: Mouth

## 2017-03-14 MED ORDER — SODIUM CHLORIDE 0.9 % IV SOLN
Freq: Once | INTRAVENOUS | Status: AC
  Administered 2017-03-14: 08:00:00 via INTRAVENOUS

## 2017-03-14 MED ORDER — PHENYLEPHRINE HCL 10 MG/ML IJ SOLN
INTRAVENOUS | Status: DC | PRN
  Administered 2017-03-14: 25 ug/min via INTRAVENOUS

## 2017-03-14 MED ORDER — SODIUM CHLORIDE 0.9% FLUSH: 3.0000 mL | INTRAVENOUS | Status: DC | PRN

## 2017-03-14 MED ORDER — PHENYLEPHRINE 40 MCG/ML (10ML) SYRINGE FOR IV PUSH (FOR BLOOD PRESSURE SUPPORT)
PREFILLED_SYRINGE | INTRAVENOUS | Status: AC
  Filled 2017-03-14: qty 10

## 2017-03-14 MED ORDER — HEMOSTATIC AGENTS (NO CHARGE) OPTIME
TOPICAL | Status: DC | PRN
  Administered 2017-03-14: 1 via TOPICAL

## 2017-03-14 MED ORDER — ONDANSETRON HCL 4 MG/2ML IJ SOLN
INTRAMUSCULAR | Status: AC
  Filled 2017-03-14: qty 2

## 2017-03-14 MED ORDER — BUPIVACAINE-EPINEPHRINE 0.5% -1:200000 IJ SOLN
INTRAMUSCULAR | Status: DC | PRN
  Administered 2017-03-14 (×2): 1.8 mL

## 2017-03-14 MED ORDER — SODIUM CHLORIDE 0.9 % IV SOLN
0.0125 ug/kg/min | INTRAVENOUS | Status: AC
  Administered 2017-03-14: .3 ug/kg/min via INTRAVENOUS
  Filled 2017-03-14: qty 2000

## 2017-03-14 MED ORDER — ONDANSETRON HCL 4 MG/2ML IJ SOLN: 4.0000 mg | Freq: Four times a day (QID) | INTRAMUSCULAR | Status: DC | PRN

## 2017-03-14 MED ORDER — POLYVINYL ALCOHOL 1.4 % OP SOLN
1.0000 [drp] | OPHTHALMIC | Status: DC | PRN
  Filled 2017-03-14: qty 15

## 2017-03-14 MED ORDER — MIDAZOLAM HCL 2 MG/2ML IJ SOLN
INTRAMUSCULAR | Status: AC
  Filled 2017-03-14: qty 2

## 2017-03-14 MED ORDER — LORATADINE 10 MG PO TABS
10.0000 mg | ORAL_TABLET | Freq: Every day | ORAL | Status: DC
  Administered 2017-03-15 – 2017-03-16 (×2): 10 mg via ORAL
  Filled 2017-03-14 (×2): qty 1

## 2017-03-14 MED ORDER — ONDANSETRON HCL 4 MG/2ML IJ SOLN
INTRAMUSCULAR | Status: DC | PRN
  Administered 2017-03-14: 4 mg via INTRAVENOUS

## 2017-03-14 MED ORDER — SODIUM CHLORIDE 0.9% FLUSH
3.0000 mL | Freq: Two times a day (BID) | INTRAVENOUS | Status: DC
  Administered 2017-03-15 – 2017-03-16 (×3): 3 mL via INTRAVENOUS

## 2017-03-14 MED ORDER — PROPOFOL 10 MG/ML IV BOLUS
INTRAVENOUS | Status: AC
  Filled 2017-03-14: qty 20

## 2017-03-14 MED ORDER — INSULIN ASPART 100 UNIT/ML ~~LOC~~ SOLN
0.0000 [IU] | Freq: Three times a day (TID) | SUBCUTANEOUS | Status: DC
  Administered 2017-03-14: 2 [IU] via SUBCUTANEOUS
  Administered 2017-03-14: 3 [IU] via SUBCUTANEOUS
  Administered 2017-03-15: 8 [IU] via SUBCUTANEOUS
  Administered 2017-03-15: 2 [IU] via SUBCUTANEOUS
  Administered 2017-03-16: 11 [IU] via SUBCUTANEOUS
  Administered 2017-03-16: 8 [IU] via SUBCUTANEOUS

## 2017-03-14 MED ORDER — ROCURONIUM BROMIDE 10 MG/ML (PF) SYRINGE
PREFILLED_SYRINGE | INTRAVENOUS | Status: AC
  Filled 2017-03-14: qty 5

## 2017-03-14 MED ORDER — DOCUSATE SODIUM 100 MG PO CAPS: 100.0000 mg | ORAL_CAPSULE | Freq: Two times a day (BID) | ORAL | Status: DC | PRN

## 2017-03-14 MED ORDER — SUGAMMADEX SODIUM 200 MG/2ML IV SOLN
INTRAVENOUS | Status: DC | PRN
  Administered 2017-03-14: 99.8 mg via INTRAVENOUS

## 2017-03-14 MED ORDER — LIDOCAINE-EPINEPHRINE 2 %-1:100000 IJ SOLN
INTRAMUSCULAR | Status: DC | PRN
  Administered 2017-03-14 (×6): 1.7 mL

## 2017-03-14 MED ORDER — LIDOCAINE 2% (20 MG/ML) 5 ML SYRINGE
INTRAMUSCULAR | Status: DC | PRN
  Administered 2017-03-14: 60 mg via INTRAVENOUS

## 2017-03-14 MED ORDER — SALINE SPRAY 0.65 % NA SOLN: 2.0000 | Freq: Two times a day (BID) | NASAL | Status: DC | PRN

## 2017-03-14 MED ORDER — DEXAMETHASONE SODIUM PHOSPHATE 10 MG/ML IJ SOLN
INTRAMUSCULAR | Status: AC
  Filled 2017-03-14: qty 1

## 2017-03-14 MED ORDER — PROPOFOL 10 MG/ML IV BOLUS
INTRAVENOUS | Status: DC | PRN
  Administered 2017-03-14: 100 mg via INTRAVENOUS

## 2017-03-14 MED ORDER — WHITE PETROLATUM EX OINT
TOPICAL_OINTMENT | CUTANEOUS | Status: DC | PRN
  Filled 2017-03-14: qty 28.35

## 2017-03-14 MED ORDER — FERROUS SULFATE 325 (65 FE) MG PO TABS
325.0000 mg | ORAL_TABLET | Freq: Two times a day (BID) | ORAL | Status: DC
  Administered 2017-03-14 – 2017-03-16 (×4): 325 mg via ORAL
  Filled 2017-03-14 (×3): qty 1

## 2017-03-14 MED ORDER — BUPIVACAINE-EPINEPHRINE (PF) 0.5% -1:200000 IJ SOLN
INTRAMUSCULAR | Status: AC
  Filled 2017-03-14: qty 3.6

## 2017-03-14 MED ORDER — MORPHINE SULFATE (PF) 2 MG/ML IV SOLN
1.0000 mg | INTRAVENOUS | Status: DC | PRN
Start: 2017-03-14 — End: 2017-03-16
  Administered 2017-03-14 – 2017-03-15 (×5): 1 mg via INTRAVENOUS
  Filled 2017-03-14 (×5): qty 1

## 2017-03-14 MED ORDER — 0.9 % SODIUM CHLORIDE (POUR BTL) OPTIME
TOPICAL | Status: DC | PRN
  Administered 2017-03-14: 1000 mL

## 2017-03-14 MED ORDER — PHENYLEPHRINE 40 MCG/ML (10ML) SYRINGE FOR IV PUSH (FOR BLOOD PRESSURE SUPPORT)
PREFILLED_SYRINGE | INTRAVENOUS | Status: DC | PRN
  Administered 2017-03-14: 160 ug via INTRAVENOUS
  Administered 2017-03-14 (×2): 80 ug via INTRAVENOUS

## 2017-03-14 MED ORDER — ROCURONIUM BROMIDE 10 MG/ML (PF) SYRINGE
PREFILLED_SYRINGE | INTRAVENOUS | Status: DC | PRN
  Administered 2017-03-14: 50 mg via INTRAVENOUS

## 2017-03-14 MED ORDER — SODIUM CHLORIDE 0.9 % IV SOLN
INTRAVENOUS | Status: DC
  Administered 2017-03-14 – 2017-03-15 (×2): via INTRAVENOUS

## 2017-03-14 MED ORDER — SODIUM POLYSTYRENE SULFONATE 15 GM/60ML PO SUSP
15.0000 g | Freq: Once | ORAL | Status: AC
  Administered 2017-03-14: 15 g via ORAL
  Filled 2017-03-14: qty 60

## 2017-03-14 MED ORDER — FENTANYL CITRATE (PF) 250 MCG/5ML IJ SOLN
INTRAMUSCULAR | Status: AC
  Filled 2017-03-14: qty 5

## 2017-03-14 MED ORDER — AMLODIPINE BESYLATE 5 MG PO TABS
5.0000 mg | ORAL_TABLET | Freq: Every day | ORAL | Status: DC
  Administered 2017-03-15: 5 mg via ORAL
  Filled 2017-03-14: qty 1

## 2017-03-14 MED ORDER — HYDROCODONE-ACETAMINOPHEN 5-325 MG PO TABS
1.0000 | ORAL_TABLET | Freq: Four times a day (QID) | ORAL | Status: DC | PRN
  Administered 2017-03-15 – 2017-03-16 (×2): 2 via ORAL
  Filled 2017-03-14 (×3): qty 2

## 2017-03-14 MED ORDER — CEFAZOLIN SODIUM-DEXTROSE 2-4 GM/100ML-% IV SOLN
2.0000 g | Freq: Once | INTRAVENOUS | Status: AC
  Administered 2017-03-14: 2 g via INTRAVENOUS
  Filled 2017-03-14: qty 100

## 2017-03-14 MED ORDER — LUBIPROSTONE 8 MCG PO CAPS: 8.0000 ug | ORAL_CAPSULE | Freq: Two times a day (BID) | ORAL | Status: DC | PRN

## 2017-03-14 MED ORDER — DEXTRAN 70-HYPROMELLOSE 0.1-0.3 % OP SOLN: 2.0000 [drp] | OPHTHALMIC | Status: DC | PRN

## 2017-03-14 MED ORDER — ATORVASTATIN CALCIUM 20 MG PO TABS
20.0000 mg | ORAL_TABLET | Freq: Every day | ORAL | Status: DC
  Administered 2017-03-14 – 2017-03-15 (×2): 20 mg via ORAL
  Filled 2017-03-14 (×2): qty 1

## 2017-03-14 MED ORDER — LIDOCAINE-EPINEPHRINE 2 %-1:100000 IJ SOLN
INTRAMUSCULAR | Status: AC
  Filled 2017-03-14: qty 10.2

## 2017-03-14 MED ORDER — MOMETASONE FURO-FORMOTEROL FUM 200-5 MCG/ACT IN AERO
2.0000 | INHALATION_SPRAY | Freq: Two times a day (BID) | RESPIRATORY_TRACT | Status: DC
  Administered 2017-03-15 – 2017-03-16 (×2): 2 via RESPIRATORY_TRACT
  Filled 2017-03-14: qty 8.8

## 2017-03-14 MED ORDER — SUGAMMADEX SODIUM 200 MG/2ML IV SOLN
INTRAVENOUS | Status: AC
  Filled 2017-03-14: qty 2

## 2017-03-14 MED ORDER — MIDAZOLAM HCL 5 MG/5ML IJ SOLN
INTRAMUSCULAR | Status: DC | PRN
  Administered 2017-03-14: 1 mg via INTRAVENOUS

## 2017-03-14 MED ORDER — DEXAMETHASONE SODIUM PHOSPHATE 10 MG/ML IJ SOLN
INTRAMUSCULAR | Status: DC | PRN
  Administered 2017-03-14: 10 mg via INTRAVENOUS

## 2017-03-14 MED ORDER — ASPIRIN EC 81 MG PO TBEC
81.0000 mg | DELAYED_RELEASE_TABLET | Freq: Every day | ORAL | Status: DC
  Administered 2017-03-15: 81 mg via ORAL
  Filled 2017-03-14: qty 1

## 2017-03-14 MED ORDER — OXYMETAZOLINE HCL 0.05 % NA SOLN
NASAL | Status: AC
  Filled 2017-03-14: qty 15

## 2017-03-14 MED ORDER — LACTATED RINGERS IV SOLN: INTRAVENOUS | Status: DC

## 2017-03-14 MED ORDER — LIDOCAINE 2% (20 MG/ML) 5 ML SYRINGE
INTRAMUSCULAR | Status: AC
  Filled 2017-03-14: qty 5

## 2017-03-14 MED ORDER — SODIUM CHLORIDE 0.9 % IV SOLN: 250.0000 mL | INTRAVENOUS | Status: DC | PRN

## 2017-03-14 MED ORDER — MORPHINE SULFATE (PF) 2 MG/ML IV SOLN
1.0000 mg | INTRAVENOUS | Status: DC | PRN
  Administered 2017-03-14: 1 mg via INTRAVENOUS
  Filled 2017-03-14: qty 1

## 2017-03-14 MED ORDER — LACTATED RINGERS IV SOLN
INTRAVENOUS | Status: DC | PRN
  Administered 2017-03-14: 07:00:00 via INTRAVENOUS

## 2017-03-14 MED ORDER — ACETAMINOPHEN 325 MG PO TABS: 650.0000 mg | ORAL_TABLET | ORAL | Status: DC | PRN

## 2017-03-14 MED ORDER — AMINOCAPROIC ACID SOLUTION 5% (50 MG/ML)
10.0000 mL | ORAL | Status: AC
  Administered 2017-03-14 (×10): 10 mL via ORAL
  Filled 2017-03-14: qty 100

## 2017-03-14 MED ORDER — MONTELUKAST SODIUM 10 MG PO TABS
10.0000 mg | ORAL_TABLET | Freq: Every day | ORAL | Status: DC
  Administered 2017-03-14 – 2017-03-15 (×2): 10 mg via ORAL
  Filled 2017-03-14 (×3): qty 1

## 2017-03-14 SURGICAL SUPPLY — 38 items
ALCOHOL 70% 16 OZ (MISCELLANEOUS) ×3 IMPLANT
ATTRACTOMAT 16X20 MAGNETIC DRP (DRAPES) ×3 IMPLANT
BLADE SURG 15 STRL LF DISP TIS (BLADE) ×2 IMPLANT
BLADE SURG 15 STRL SS (BLADE) ×4
COVER SURGICAL LIGHT HANDLE (MISCELLANEOUS) ×3 IMPLANT
GAUZE PACKING FOLDED 2  STR (GAUZE/BANDAGES/DRESSINGS) ×2
GAUZE PACKING FOLDED 2 STR (GAUZE/BANDAGES/DRESSINGS) ×1 IMPLANT
GAUZE SPONGE 4X4 12PLY STRL LF (GAUZE/BANDAGES/DRESSINGS) ×3 IMPLANT
GAUZE SPONGE 4X4 16PLY XRAY LF (GAUZE/BANDAGES/DRESSINGS) ×6 IMPLANT
GLOVE BIOGEL PI IND STRL 6 (GLOVE) ×1 IMPLANT
GLOVE BIOGEL PI INDICATOR 6 (GLOVE) ×2
GLOVE SURG ORTHO 8.0 STRL STRW (GLOVE) ×3 IMPLANT
GLOVE SURG SS PI 6.0 STRL IVOR (GLOVE) ×3 IMPLANT
GOWN STRL REUS W/ TWL LRG LVL3 (GOWN DISPOSABLE) ×1 IMPLANT
GOWN STRL REUS W/TWL 2XL LVL3 (GOWN DISPOSABLE) ×3 IMPLANT
GOWN STRL REUS W/TWL LRG LVL3 (GOWN DISPOSABLE) ×2
HEMOSTAT SURGICEL 2X14 (HEMOSTASIS) ×3 IMPLANT
KIT BASIN OR (CUSTOM PROCEDURE TRAY) ×3 IMPLANT
KIT ROOM TURNOVER OR (KITS) ×3 IMPLANT
MANIFOLD NEPTUNE WASTE (CANNULA) ×3 IMPLANT
NEEDLE BLUNT 16X1.5 OR ONLY (NEEDLE) ×3 IMPLANT
NS IRRIG 1000ML POUR BTL (IV SOLUTION) ×3 IMPLANT
PACK EENT II TURBAN DRAPE (CUSTOM PROCEDURE TRAY) ×3 IMPLANT
PAD ARMBOARD 7.5X6 YLW CONV (MISCELLANEOUS) ×3 IMPLANT
SPONGE SURGIFOAM ABS GEL 100 (HEMOSTASIS) IMPLANT
SPONGE SURGIFOAM ABS GEL 12-7 (HEMOSTASIS) IMPLANT
SPONGE SURGIFOAM ABS GEL SZ50 (HEMOSTASIS) IMPLANT
SUCTION FRAZIER HANDLE 10FR (MISCELLANEOUS) ×2
SUCTION TUBE FRAZIER 10FR DISP (MISCELLANEOUS) ×1 IMPLANT
SUT CHROMIC 3 0 PS 2 (SUTURE) ×6 IMPLANT
SUT CHROMIC 4 0 P 3 18 (SUTURE) ×15 IMPLANT
SYR 50ML SLIP (SYRINGE) ×3 IMPLANT
TOWEL OR 17X26 10 PK STRL BLUE (TOWEL DISPOSABLE) ×3 IMPLANT
TUBE CONNECTING 12'X1/4 (SUCTIONS) ×1
TUBE CONNECTING 12X1/4 (SUCTIONS) ×2 IMPLANT
WATER STERILE IRR 1000ML POUR (IV SOLUTION) ×3 IMPLANT
WATER TABLETS ICX (MISCELLANEOUS) ×3 IMPLANT
YANKAUER SUCT BULB TIP NO VENT (SUCTIONS) ×3 IMPLANT

## 2017-03-14 NOTE — Discharge Instructions (Signed)

## 2017-03-14 NOTE — H&P (Signed)
03/14/2017  Patient:            Sonya Carpenter Date of Birth:  Mar 02, 1946 MRN:                [id number]   BP (!) 135/51   Pulse 87   Temp 97.9 F (36.6 C) (Oral)   Resp 18   Ht 4\' 10"  (1.473 m)   Wt 110 lb (49.9 kg)   SpO2 100%   BMI 22.99 kg/m    Sonya Carpenter is a 71 year old female with severe aortic stenosis.  Patient with anticipated TAVR procedure in the near future.  Patient now presents for extraction of remaining teeth with alveoloplasty and periprosthetic surgery as needed in the operating room and general anesthesia.  Patient denies any acute medical or dental changes.Please see note from Dr. Cyndia Bent on 03/08/2017 to act as the H&P for dental operating room procedure.   Lenn Cal, DDS    MULTIDISCIPLINARY HEART VALVE CLINIC  CARDIOTHORACIC SURGERY CONSULTATION REPORT  Referring Provider is Delanna Notice, MD  PCP is Michell Heinrich, DO     Chief Complaint  Patient presents with  . TAVR evaluation   HPI:  The patient is a 71 year old woman with a history of hypertension, type 2 diabetes with multiple complications, stage IV chronic kidney disease with a creatinine of 1.86, at least moderate COPD with long-standing history of secondhand smoke exposure from her husband, asthma, congestive heart failure, aortic stenosis, and gallbladder disease. She had an echo about 3 years ago which showed moderate aortic stenosis but then was lost to follow-up because her husband was in poor health. He eventually died and she did fairly well until several months ago when she was hospitalized in [place] for what was felt to be pneumonia. During that hospitalization she was thought to have a possible abdominal mass with biliary ductal dilatation and was referred to [facility] where she was diagnosed with choledocho-cholelithiasis by ERCP. She did not have an abdominal mass but did have gallstones and was told that she required cholecystectomy. Preoperative cardiac clearance was  requested. She was subsequently readmitted to the hospital in [place] in September with shortness of breath and a large right pleural effusion that required thoracentesis. She was treated for possible pneumonia. A transthoracic echocardiogram at that time showed a mean transvalvular gradient of 69 mmHg with a peak of 121 mmHg. She was then seen by Dr. Burt Knack for consideration of TAVR and underwent cardiac catheterization on February 09, 2017. This showed a 70% proximal left circumflex coronary stenosis with otherwise mild nonobstructive coronary disease. The mean transvalvular gradient was 18.2 mmHg and a peak to peak gradient was 35 mmHg. Aortic valve area was calculated at 1.26 cm. She then underwent her preoperative CT scans for consideration of TAVR and was seen yesterday by Dr. Roxy Manns. Due to the discrepancy in her previous echocardiogram and her catheterization findings she underwent a repeat echocardiogram yesterday that was read by Dr. Meda Coffee. This showed a trileaflet, severely thickened, severely calcified aortic valve with restricted mobility and a mean gradient of 91 mmHg with a peak gradient of 131 mmHg. The mitral valve had a calcified annulus with severely thickened and calcified leaflets with restricted mobility and a peak gradient of 23 mmHg. There was mild regurgitation. Left ventricular ejection fraction was 65-70% with moderate concentric left ventricular hypertrophy and grade 1 diastolic dysfunction.  She is here today with her uncle. She is widowed and lives alone. She has a somewhat sedentary lifestyle and  uses a walker for balance. She reports progressive symptoms of exertional shortness of breath and swelling in her legs that improved with Lasix. She was recently hospitalized with resting shortness of breath and orthopnea as well as lower extremity edema. She denies any chest pain or pressure. She denies dizziness and syncope.      Past Medical History:  Diagnosis Date  . Anemia   .  Aortic stenosis   . Aortic valve disorder   . Asthma   . CAD (coronary artery disease)   . CKD (chronic kidney disease), stage IV ([place])   . Edema   . Gallbladder disease   . Heart murmur   . Hyperlipidemia   . Hypertension   . Other seasonal allergic rhinitis   . SOB (shortness of breath)   . Vitamin D deficiency         Past Surgical History:  Procedure Laterality Date  . BREAST CYST EXCISION Right   . COLONSCOPY    . GALLBLADDER SURGERY    . KNEE SURGERY    . REFRACTIVE SURGERY    . TONSILLECTOMY          Family History  Problem Relation Age of Onset  . Hypertension Mother   . Kidney failure Mother   . Heart disease Mother   . Hypertension Father   . Heart disease Father   . Diabetes Father   . Cancer Sister    Social History        Socioeconomic History  . Marital status: Widowed    Spouse name: Not on file  . Number of children: 0  . Years of education: Not on file  . Highest education level: Not on file  Social Needs  . Financial resource strain: Not on file  . Food insecurity - worry: Not on file  . Food insecurity - inability: Not on file  . Transportation needs - medical: Not on file  . Transportation needs - non-medical: Not on file  Occupational History  . Not on file  Tobacco Use  . Smoking status: Never Smoker  . Smokeless tobacco: Never Used  Substance and Sexual Activity  . Alcohol use: No    Frequency: Never  . Drug use: No  . Sexual activity: No  Other Topics Concern  . Not on file  Social History Narrative   Here with Uncle and [place].         Current Outpatient Medications  Medication Sig Dispense Refill  . acetaminophen (TYLENOL) 500 MG tablet Take 500 mg by mouth every 6 (six) hours as needed for mild pain or moderate pain.    Marland Kitchen albuterol (2.5 MG/3ML) 0.083% NEBU 3 mL, albuterol (5 MG/ML) 0.5% NEBU 0.5 mL Inhale 1 mg into the lungs 3 (three) times daily as needed (shortness of breath).     Marland Kitchen albuterol (PROVENTIL HFA;VENTOLIN  HFA) 108 (90 Base) MCG/ACT inhaler Inhale into the lungs every 6 (six) hours as needed for wheezing or shortness of breath.    Marland Kitchen amLODipine (NORVASC) 5 MG tablet Take 5 mg by mouth daily.    Marland Kitchen aspirin EC 81 MG tablet Take 81 mg by mouth daily.    Marland Kitchen atorvastatin (LIPITOR) 20 MG tablet Take 20 mg by mouth daily.    . budesonide-formoterol (SYMBICORT) 160-4.5 MCG/ACT inhaler Inhale 2 puffs into the lungs 2 (two) times daily.    . cetirizine (ZYRTEC) 10 MG tablet Take 10 mg by mouth daily.    Marland Kitchen Dextran 70-Hypromellose (ARTIFICIAL TEARS) 0.1-0.3 %  SOLN Place 1 drop into both eyes 2 (two) times daily.     Mariane Baumgarten Calcium (STOOL SOFTENER PO) Take 1 capsule by mouth 2 (two) times daily.    . ferrous sulfate 325 (65 FE) MG EC tablet Take 325 mg by mouth 2 (two) times daily.     . furosemide (LASIX) 40 MG tablet Take 1 tablet (40 mg total) by mouth daily. 30 tablet 3  . ipratropium (ATROVENT) 0.02 % nebulizer solution Take 0.5 mg by nebulization 2 (two) times daily.    Marland Kitchen LANTUS SOLOSTAR 100 UNIT/ML Solostar Pen Inject 20 Units into the skin every morning.     Marland Kitchen losartan (COZAAR) 100 MG tablet Take 50 mg by mouth daily.     Marland Kitchen lubiprostone (AMITIZA) 8 MCG capsule Take 8 mcg by mouth daily.    . montelukast (SINGULAIR) 10 MG tablet Take 10 mg by mouth at bedtime.    . Multiple Vitamins-Minerals (MULTIVITAMIN WITH MINERALS) tablet Take 1 tablet by mouth daily.    . potassium chloride (K-DUR) 10 MEQ tablet Take 1 tablet (10 mEq total) by mouth daily. 30 tablet 3  . sodium chloride (OCEAN) 0.65 % SOLN nasal spray Place 1 spray into both nostrils 2 (two) times daily as needed for congestion.     . traMADol (ULTRAM) 50 MG tablet Take 50 mg by mouth every 6 (six) hours as needed (pain).      No current facility-administered medications for this visit.        Allergies  Allergen Reactions  . No Known Allergies    Review of Systems:  General: normal appetite, reduced energy, no weight gain, no weight loss,  no fever  Cardiac: no chest pain with exertion, no chest pain at rest, has SOB with mil exertion, occasional resting SOB, no PND, has orthopnea, no palpitations, no arrhythmia, no atrial fibrillation, has LE edema, no dizzy spells, no syncope  Respiratory: has shortness of breath, no home oxygen, no productive cough, has dry cough, has bronchitis, no wheezing, no hemoptysis, has asthma, no pain with inspiration or cough, no sleep apnea, no CPAP at night  GI: no difficulty swallowing, no reflux, no frequent heartburn, no hiatal hernia, has abdominal pain, has constipation, no diarrhea, no hematochezia, no hematemesis, no melena  GU: no dysuria, no frequency, no urinary tract infection, no hematuria, no kidney stones, has chronic kidney disease  Vascular: no pain suggestive of claudication, no pain in feet, no leg cramps, no varicose veins, no DVT, no non-healing foot ulcer  Neuro: no stroke, no TIA's, no seizures, no headaches, no temporary blindness one eye, no slurred speech, no peripheral neuropathy, no chronic pain, has instability of gait, no memory/cognitive dysfunction  Musculoskeletal: has arthritis, no joint swelling, no myalgias, no difficulty walking, normal mobility with walker  Skin: no rash, no itching, has skin infections, no pressure sores or ulcerations  Psych: no anxiety, no depression, no nervousness, no unusual recent stress  Eyes: has blurry vision, no floaters, no recent vision changes, wears glasses  ENT: has hearing loss, has loose or painful teeth, no dentures, last saw dentist years ago  Hematologic: has easy bruising, no abnormal bleeding, no clotting disorder, no frequent epistaxis  Endocrine: has diabetes, does check CBG's at home  Physical Exam:  BP (!) 108/59  Pulse 89  Ht 4\' 10"  (1.473 m)  Wt 110 lb (49.9 kg)  SpO2 98%  BMI 22.99 kg/m  General: Elderlty, frail-appearing woman in no distress  HEENT: NCAT/PERLA, EOMI, oropharynx  clear, teeth in poor condition with  multiple missing and discolored teeth, obvious gum disease.  Neck: no JVD, no bruits, no adenopathy or thyromegaly  Chest: clear to auscultation, symmetrical breath sounds, no wheezes, no rhonchi  CV: RRR, grade IV/VI crescendo/decrescendo murmur heard best at RUSB, no diastolic murmur  Abdomen: soft, non-tender, no masses or organomegaly  Extremities: warm, well-perfused, pulses diminished in feet, no LE edema  Rectal/GU Deferred  Neuro: Grossly non-focal and symmetrical throughout  Skin: Clean and dry, no rashes, no breakdown  Diagnostic Tests:  [facility]  [address], [place] [address]  [phone]  -------------------------------------------------------------------  Transthoracic Echocardiography  (Report amended )  Patient: Leilana, Mcquire  MR #: [id number]  Study Date: 03/07/2017  Gender: F  Age: 14  Height: 147.3 cm  Weight: 49.9 kg  BSA: 1.44 m^2  Pt. Status:  Room:  ATTENDING [name], MD  SONOGRAPHER Cindy Hazy, RDCS  PERFORMING [facility], Outpatient  Riley Kill, Katie  REFERRING [place], Katie  REFERRING Marveen Reeks, Daniel  cc:  -------------------------------------------------------------------  LV EF: 65% - 70%  -------------------------------------------------------------------  Indications: I35.9 Aortic Valve Disorder.  -------------------------------------------------------------------  History: PMH: Acquired from the patient and from the patient&'s  chart. PMH: Anemia. Asthma. Chronic Kidney Disease. Coronary  artery disease. Murmur. Shortness of Breath. Risk factors:  Hypertension. Diabetes mellitus. Dyslipidemia.  -------------------------------------------------------------------  Study Conclusions  - Left ventricle: The cavity size was normal. There was moderate  concentric hypertrophy. Systolic function was vigorous. The  estimated ejection fraction was in the range of 65% to 70%. Wall  motion was normal; there were  no regional wall motion  abnormalities. Doppler parameters are consistent with abnormal  left ventricular relaxation (grade 1 diastolic dysfunction).  Doppler parameters are consistent with both elevated ventricular  end-diastolic filling pressure and elevated left atrial filling  pressure.  - Aortic valve: Trileaflet; severely thickened, severely calcified  leaflets. Valve mobility was restricted. There was trivial  regurgitation. Mean gradient (S): 91 mm Hg. Peak gradient (S):  131 mm Hg.  - Ascending aorta: The ascending aorta was normal in size.  - Mitral valve: Calcified annulus. Severely thickened, severely  calcified leaflets . Mobility was restricted. The findings are  consistent with severe stenosis. There was mild regurgitation.  Peak gradient (D): 23 mm Hg.  - Left atrium: The atrium was severely dilated.  - Right ventricle: The cavity size was normal. Wall thickness was  normal. Systolic function was normal.  - Pulmonary arteries: Systolic pressure was within the normal  range.  - Inferior vena cava: The vessel was normal in size.  Impressions:  - Normal LVEF. Severely elevated LVEDP.  Critical aortic stenosis with peak/mean transaortic gradients  131/91 mmHg.  Severe mitral stenosis with mean gradient 11 mmHg.  -------------------------------------------------------------------  Study data: Study status: Routine. Procedure: The patient  reported no pain pre or post test. Transthoracic echocardiography  for left ventricular function evaluation, for right ventricular  function evaluation, and for assessment of valvular function. Image  quality was adequate. Study completion: There were no  complications. Transthoracic echocardiography. M-mode,  complete 2D, spectral Doppler, and color Doppler. Birthdate:  Patient birthdate: 15-Feb-1946. Age: Patient is 71 yr old. Sex:  Gender: female. BMI: 23 kg/m^2. Blood pressure: 136/60  Patient status: Outpatient. Study date: Study  date: 03/07/2017.  Study time: 03:16 PM. Location: [facility]  -------------------------------------------------------------------  -------------------------------------------------------------------  Left ventricle: The cavity size was normal. There was moderate  concentric hypertrophy. Systolic function was vigorous.  The  estimated ejection fraction was in the range of 65% to 70%. Wall  motion was normal; there were no regional wall motion  abnormalities. Doppler parameters are consistent with abnormal left  ventricular relaxation (grade 1 diastolic dysfunction). Doppler  parameters are consistent with both elevated ventricular  end-diastolic filling pressure and elevated left atrial filling  pressure.  -------------------------------------------------------------------  Aortic valve: Trileaflet; severely thickened, severely calcified  leaflets. Valve mobility was restricted. Doppler: Transvalvular  velocity was within the normal range. There was no stenosis. There  was trivial regurgitation. VTI ratio of LVOT to aortic valve:  0.2. Valve area (VTI): 0.57 cm^2. Indexed valve area (VTI): 0.4  cm^2/m^2. Peak velocity ratio of LVOT to aortic valve: 0.19. Valve  area (Vmax): 0.55 cm^2. Indexed valve area (Vmax): 0.38 cm^2/m^2.  Mean velocity ratio of LVOT to aortic valve: 0.23. Valve area  (Vmean): 0.65 cm^2. Indexed valve area (Vmean): 0.45 cm^2/m^2.  Mean gradient (S): 91 mm Hg. Peak gradient (S): 131 mm Hg.  -------------------------------------------------------------------  Aorta: Aortic root: The aortic root was normal in size.  Ascending aorta: The ascending aorta was normal in size.  -------------------------------------------------------------------  Mitral valve: Calcified annulus. Severely thickened, severely  calcified leaflets . Mobility was restricted. Doppler: The  findings are consistent with severe stenosis. There was mild  regurgitation. Valve area by pressure  half-time: 1.79 cm^2.  Indexed valve area by pressure half-time: 1.25 cm^2/m^2. Valve area  by continuity equation (using LVOT flow): 1.04 cm^2. Indexed valve  area by continuity equation (using LVOT flow): 0.72 cm^2/m^2.  Mean gradient (D): 11 mm Hg. Peak gradient (D): 23 mm Hg.  -------------------------------------------------------------------  Left atrium: The atrium was severely dilated.  -------------------------------------------------------------------  Right ventricle: The cavity size was normal. Wall thickness was  normal. Systolic function was normal.  -------------------------------------------------------------------  Pulmonic valve: Structurally normal valve. Cusp separation was  normal. Doppler: Transvalvular velocity was within the normal  range. There was no evidence for stenosis. There was no  regurgitation.  -------------------------------------------------------------------  Tricuspid valve: Structurally normal valve. Doppler:  Transvalvular velocity was within the normal range. There was no  regurgitation.  -------------------------------------------------------------------  Pulmonary artery: The main pulmonary artery was normal-sized.  Systolic pressure was within the normal range.  -------------------------------------------------------------------  Right atrium: The atrium was normal in size.  -------------------------------------------------------------------  Pericardium: There was no pericardial effusion.  -------------------------------------------------------------------  Systemic veins:  Inferior vena cava: The vessel was normal in size.  -------------------------------------------------------------------  Measurements  Left ventricle Value Reference  LV ID, ED, PLAX chordal (L) 36.3 mm 43 - 52  LV ID, ES, PLAX chordal (L) 21.2 mm 23 - 38  LV fx shortening, PLAX chordal 42 % >=29  LV PW thickness, ED 15.4 mm ---------  IVS/LV PW ratio, ED 0.94 <=1.3    Stroke volume, 2D 61 ml ---------  Stroke volume/bsa, 2D 42 ml/m^2 ---------  LV e&', lateral 4.17 cm/s ---------  LV E/e&', lateral 36.93 ---------  LV e&', medial 4.17 cm/s ---------  LV E/e&', medial 36.93 ---------  LV e&', average 4.17 cm/s ---------  LV E/e&', average 36.93 ---------  Ventricular septum Value Reference  IVS thickness, ED 14.5 mm ---------  LVOT Value Reference  LVOT ID, S 19 mm ---------  LVOT area 2.84 cm^2 ---------  LVOT ID 19 mm ---------  LVOT peak velocity, S 99.6 cm/s ---------  LVOT mean velocity, S 80.1 cm/s ---------  LVOT VTI, S 21.5 cm ---------  LVOT peak gradient, S 4 mm Hg ---------  Stroke volume (SV), LVOT  DP 61 ml ---------  Stroke index (SV/bsa), LVOT DP 42.4 ml/m^2 ---------  Aortic valve Value Reference  Aortic valve peak velocity, S 516 cm/s ---------  Aortic valve mean velocity, S 351 cm/s ---------  Aortic valve VTI, S 108 cm ---------  Aortic mean gradient, S 57 mm Hg ---------  Aortic peak gradient, S 107 mm Hg ---------  VTI ratio, LVOT/AV 0.2 ---------  Aortic valve area, VTI 0.57 cm^2 ---------  Aortic valve area/bsa, VTI 0.4 cm^2/m^2 ---------  Velocity ratio, peak, LVOT/AV 0.19 ---------  Aortic valve area, peak velocity 0.55 cm^2 ---------  Aortic valve area/bsa, peak 0.38 cm^2/m^2 ---------  velocity  Velocity ratio, mean, LVOT/AV 0.23 ---------  Aortic valve area, mean velocity 0.65 cm^2 ---------  Aortic valve area/bsa, mean 0.45 cm^2/m^2 ---------  velocity  Aorta Value Reference  Aortic root ID, ED 32 mm ---------  Ascending aorta ID, A-P, S 33 mm ---------  Left atrium Value Reference  LA ID, A-P, ES 53 mm ---------  LA ID/bsa, A-P (H) 3.69 cm/m^2 <=2.2  LA volume, S 69 ml ---------  LA volume/bsa, S 48 ml/m^2 ---------  LA volume, ES, 1-p A4C 66 ml ---------  LA volume/bsa, ES, 1-p A4C 45.9 ml/m^2 ---------  LA volume, ES, 1-p A2C 72 ml ---------  LA volume/bsa, ES, 1-p A2C 50.1 ml/m^2 ---------  Mitral  valve Value Reference  Mitral E-wave peak velocity 154 cm/s ---------  Mitral A-wave peak velocity 202 cm/s ---------  Mitral mean velocity, D 150 cm/s ---------  Mitral deceleration time (H) 338 ms 150 - 230  Mitral pressure half-time 126 ms ---------  Mitral mean gradient, D 10 mm Hg ---------  Mitral peak gradient, D 9 mm Hg ---------  Mitral E/A ratio, peak 0.8 ---------  Mitral valve area, PHT, DP 1.79 cm^2 ---------  Mitral valve area/bsa, PHT, DP 1.25 cm^2/m^2 ---------  Mitral valve area, LVOT 1.04 cm^2 ---------  continuity  Mitral valve area/bsa, LVOT 0.72 cm^2/m^2 ---------  continuity  Mitral annulus VTI, D 58.9 cm ---------  Right ventricle Value Reference  RV s&', lateral, S 18.6 cm/s ---------  Legend:  (L) and (H) mark values outside specified reference range.  -------------------------------------------------------------------  Lance Morin, M.D.  2018-11-13T07:[id number]:12  Physicians  Panel Physicians Referring Physician Case Authorizing Physician  Burnell Blanks, MD (Primary)    Procedures  RIGHT/LEFT HEART CATH AND CORONARY ANGIOGRAPHY  Conclusion  Mid RCA to Dist RCA lesion, 40 %stenosed.  Prox RCA to Mid RCA lesion, 20 %stenosed.  Ost Cx to Prox Cx lesion, 70 %stenosed.  Ost 3rd Mrg to 3rd Mrg lesion, 40 %stenosed.  Prox LAD to Mid LAD lesion, 30 %stenosed.  Dist LAD lesion, 20 %stenosed.  Ost 1st Diag lesion, 40 %stenosed.  Ost 2nd Diag to 2nd Diag lesion, 30 %stenosed.  There is severe aortic valve stenosis. 1. Single vessel CAD with moderately severe, calcified stenosis in the proximal Circumflex artery  2. Mild non-obstructive disease in the RCA and LAD  3. Severe aortic stenosis (mean gradient 18.50mmHg, peak gradient 35 mmHg, AVA 1.26 cm2 ).  Recommendations: Will review films with the TAVR team. We may be able to manage her CAD medically. Will continue workup for TAVR.   Indications  Severe aortic stenosis [I35.0 (ICD-10-CM)]    Procedural Details/Technique  Technical Details Indication: 71 yo female with h/o DM, severe aortic stenosis. Pre-TAVR workup.   Procedure: The risks, benefits, complications, treatment options, and expected outcomes were discussed with the patient. The patient and/or family concurred  with the proposed plan, giving informed consent. The patient was brought to the cath lab after IV hydration was given. The patient was further sedated with Versed and Fentanyl. There was an IV catheter present in the right antecubital vein. This was prepped and draped. I then changed out this catheter for a % French sheath. Right heart cath performed with a balloon tipped catheter. The right wrist was prepped and draped in a sterile fashion. 1% lidocaine was used for local anesthesia. Using the modified Seldinger access technique, a 5 French sheath was placed in the right radial artery. 3 mg Verapamil was given through the sheath. 3000 units IV heparin was given. Standard diagnostic catheters were used to perform selective coronary angiography. I crossed the aortic valve with an AL-1 catheter and a straight wire. The sheath was removed from the right radial artery and a Terumo hemostasis band was applied at the arteriotomy site on the right wrist.     Estimated blood loss <50 mL.  During this procedure the patient was administered the following to achieve and maintain moderate conscious sedation: Versed 1 mg, Fentanyl 25 mcg, while the patient's heart rate, blood pressure, and oxygen saturation were continuously monitored. The period of conscious sedation was 32 minutes, of which I was present face-to-face 100% of this time.  Complications  Complications documented before study signed (02/09/2017 1:45 PM EDT)   RIGHT/LEFT HEART CATH AND CORONARY ANGIOGRAPHY   None Documented by Burnell Blanks, MD 02/09/2017 1:44 PM EDT  Time Range: Intra-procedure    Coronary Findings  Diagnostic  Dominance: Right  Left  Anterior Descending  Prox LAD to Mid LAD lesion 30% stenosed  Prox LAD to Mid LAD lesion.  Dist LAD lesion 20% stenosed  Dist LAD lesion.  First Diagonal Branch  Vessel is small in size.  Ost 1st Diag lesion 40% stenosed  Ost 1st Diag lesion.  Second Diagonal Branch  Vessel is small in size.  Ost 2nd Diag to 2nd Diag lesion 30% stenosed  Ost 2nd Diag to 2nd Diag lesion.  Ramus Intermedius  Vessel is small.  Left Circumflex  Vessel is moderate in size.  Ost Cx to Prox Cx lesion 70% stenosed  The lesion is calcified.  First Obtuse Marginal Branch  Vessel is small in size.  Second Obtuse Marginal Branch  Vessel is moderate in size.  Third Obtuse Marginal Branch  Vessel is small in size.  Ost 3rd Mrg to 3rd Mrg lesion 40% stenosed  Ost 3rd Mrg to 3rd Mrg lesion.  Right Coronary Artery  Prox RCA to Mid RCA lesion 20% stenosed  Prox RCA to Mid RCA lesion.  Mid RCA to Dist RCA lesion 40% stenosed  The lesion is calcified.  Intervention  No interventions have been documented.  Left Heart  Aortic Valve There is severe aortic valve stenosis.  Coronary Diagrams  Diagnostic Diagram     Implants     No implant documentation for this case.  MERGE Images  Link to Procedure Log   Show images for CARDIAC CATHETERIZATION Procedure Log  Hemo Data   Most Recent Value  Fick Cardiac Output 5.17 L/min  Fick Cardiac Output Index 3.67 (L/min)/BSA  Aortic Mean Gradient 18.2 mmHg  Aortic Peak Gradient 35 mmHg  Aortic Valve Area 1.26  Aortic Value Area Index 0.89 cm2/BSA  RA A Wave 5 mmHg  RA V Wave 2 mmHg  RA Mean 2 mmHg  RV Systolic Pressure 39 mmHg  RV Diastolic Pressure 1 mmHg  RV EDP 3 mmHg  PA Systolic Pressure 35 mmHg  PA Diastolic Pressure 13 mmHg  PA Mean 22 mmHg  PW A Wave 20 mmHg  PW V Wave 19 mmHg  PW Mean 13 mmHg  AO Systolic Pressure 643 mmHg  AO Diastolic Pressure 50 mmHg  AO Mean 78 mmHg  LV Systolic Pressure 329 mmHg  LV Diastolic Pressure 4 mmHg  LV EDP 10  mmHg  Arterial Occlusion Pressure Extended Systolic Pressure 518 mmHg  Arterial Occlusion Pressure Extended Diastolic Pressure 56 mmHg  Arterial Occlusion Pressure Extended Mean Pressure 87 mmHg  Left Ventricular Apex Extended Systolic Pressure 841 mmHg  Left Ventricular Apex Extended Diastolic Pressure 5 mmHg  Left Ventricular Apex Extended EDP Pressure 9 mmHg  QP/QS 1  TPVR Index 6 HRUI  TSVR Index 21.27 HRUI  PVR SVR Ratio 0.12  TPVR/TSVR Ratio 0.28   ADDENDUM REPORT: 03/01/2017 15:02  EXAM:  OVER-READ INTERPRETATION CT CHEST  The following report is an over-read performed by radiologist Dr.  Samara Snide [facility] on 03/01/2017. This over-read  does not include interpretation of cardiac or coronary anatomy or  pathology. The coronary CTA interpretation by the cardiologist is  attached.  COMPARISON: None.  FINDINGS:  Please see the separate report for the concurrent dedicated chest CT  angiogram for further details regarding significant extracardiac  findings in the chest. No acute consolidative airspace disease, lung  masses or significant pulmonary nodules. No pneumothorax or pleural  effusions. Mild goiter. Right adrenal 1.5 cm nodule with density 40  HU (series 9/image 70). No suspicious focal osseous lesions.  IMPRESSION:  Please see the separate report for the concurrent dedicated chest CT  angiogram for details regarding significant extracardiac findings in  the chest.  Electronically Signed  By: Ilona Sorrel M.D.  On: 03/01/2017 15:02   Addended by Sharyn Blitz, MD on 03/01/2017 3:04 PM  Study Result   CLINICAL DATA: Aortic Stenosis  EXAM:  Cardiac TAVR CT  MEDICATIONS:  None  TECHNIQUE:  The patient was scanned on a Siemens Force 660 slice scanner. A 120  kV retrospective scan was triggered in the ascending thoracic aorta  at 140 HU's. Gantry rotation speed was 250 msecs and collimation was  .6 mm. No beta blockade or nitro were given.  The 3D data set was  reconstructed in 5% intervals of the R-R cycle. Systolic and  diastolic phases were analyzed on a dedicated work station using  MPR, MIP and VRT modes. The patient received 80 cc of contrast.  The patient was scanned on a Siemens Force 630 slice scanner. Gantry  rotation speed was 250 msecs. Collimation was .6 mm. A 100 kV  prospective scan was triggered in the ascending thoracic aorta at  140 HU's Full mA was used between 35% and 75% of the R-R interval.  Average HR during the scan was 80 bpm. The 3D data set was  interpreted on a dedicated work station using MPR, MIP and VRT  modes. A total of 80cc of contrast was used for the entire study  including the chest CTA and run off. Patient had bicarbonate drip  prior to procedure for CRF.  FINDINGS:  Aortic Valve: Tri leaflet and moderately calcified with restricted  leaflet motion.  Aorta: Moderate atherosclerotic disease with normal arch vessel  origin  Sino-tubular Junction: 24 mm  Ascending Thoracic Aorta: 30 mm  Aortic Arch: 23 mm  Descending Thoracic Aorta: 21 mm  Sinus of Valsalva Measurements:  Non-coronary:  31 mm  Right - coronary: 30 mm  Left - coronary: 30 mm  Coronary Artery Height above Annulus:  Left Main: 11.5 mm above annulus  Right Coronary: 16.4 mm above annulus  Virtual Basal Annulus Measurements:  Maximum / Minimum Diameter: 26.6 mm x 19.2 mm elliptical  Perimeter: 72.7 mm  Area: 296 mm2  Coronary Arteries: Sufficient height above annulus for deployment  Optimum Fluoroscopic Angle for Delivery: LAO 17 degrees Caudal 9  degrees  IMPRESSION:  1) Calcified tri leaflet aortic valve with annular area of 396 mm2  suitable for a 23 mm Sapien 3 valve and perimeter of 72.7 mm  suitable for a 26 mm Evolut Pro valve  2) Optimum angiographic angle for deployment LAO 17 degrees Caudal 9  degrees  3) Coronary arteries sufficient height above annulus for deployment  4) Normal aortic root and arch  vessels with moderate calcific  atherosclerosis  5) Significant nodular calcium at base of left annular sinus  extending into the intervalvular fibrosa. Also appears to be  significant thickening and restriction to motion of mitral valve  suggest echo correlation  6) Moderate LAE with no LAA thrombus  Jenkins Rouge  Electronically Signed:  By: Jenkins Rouge M.D.  On: 03/01/2017 14:57  CLINICAL DATA: Severe aortic stenosis. TAVR evaluation.  EXAM:  CT ANGIOGRAPHY CHEST, ABDOMEN AND PELVIS  TECHNIQUE:  Multidetector CT imaging through the chest, abdomen and pelvis was  performed using the standard protocol during bolus administration of  intravenous contrast. Multiplanar reconstructed images and MIPs were  obtained and reviewed to evaluate the vascular anatomy.  CONTRAST: 80 cc Isovue 370 IV.  COMPARISON: None.  FINDINGS:  CTA CHEST FINDINGS  Cardiovascular: Mild cardiomegaly. There is diffuse thickening and  coarse calcification of the aortic valve. No significant pericardial  fluid/thickening. Left main, left anterior descending, left  circumflex and right coronary atherosclerosis . Atherosclerotic  nonaneurysmal thoracic aorta. No evidence of acute intramural  hematoma, dissection, pseudoaneurysm or penetrating atherosclerotic  ulcer. Aortic arch branch vessels are patent. Top-normal caliber  main pulmonary artery (3.0 cm diameter). No central pulmonary  emboli.  Mediastinum/Nodes: Multinodular goiter with several hypodense  nodules throughout the bilateral thyroid lobes, largest 1.1 cm in  the upper right thyroid lobe. Mildly patulous thoracic esophagus.  Small fluid level in the lower thoracic esophagus. No pathologically  enlarged axillary, mediastinal or hilar lymph nodes.  Lungs/Pleura: No pneumothorax. No pleural effusion. No acute  consolidative airspace disease, lung masses or significant pulmonary  nodules. Nonspecific diffuse bronchial wall thickening.    Musculoskeletal: No aggressive appearing focal osseous lesions.  Moderate thoracic spondylosis.  CTA ABDOMEN AND PELVIS FINDINGS  Hepatobiliary: Normal liver size. Nonspecific heterogeneous liver  parenchymal enhancement with numerous subcentimeter nodular foci of  hyperenhancement throughout the liver, probably representing  transient hepatic attenuation differences due to the early phase of  contrast. No definite liver masses. Normal gallbladder with no  radiopaque cholelithiasis. There is pneumobilia in the left central  bile ducts. No intrahepatic biliary ductal dilatation. Dilated  common bile duct (11 mm diameter) with smooth distal tapering. No  radiopaque choledocholithiasis.  Pancreas: No pancreatic mass. No pancreatic duct dilation.  Spleen: Normal size. No mass.  Adrenals/Urinary Tract: Right adrenal 1.5 cm nodule with density 103  HU (series 14/ image 81). No left adrenal nodules. No  hydronephrosis. Small simple left renal cysts, largest 1.5 cm.  Additional subcentimeter hypodense renal cortical lesions in both  kidneys, too small to characterize, requiring no follow-up. Normal  bladder.  Stomach/Bowel: Grossly normal stomach. Normal caliber small bowel  with no small bowel wall thickening. Normal appendix. Mild scattered  colonic diverticulosis, with no large bowel wall thickening or  pericolonic fat stranding.  Vascular/Lymphatic: Atherosclerotic nonaneurysmal abdominal aorta.  Patent portal, splenic and renal veins. No pathologically enlarged  lymph nodes in the abdomen or pelvis.  Reproductive: Mildly enlarged myomatous uterus with coarsely  calcified posterior uterine 4.8 cm subserosal fibroid. No adnexal  masses.  Other: No pneumoperitoneum, ascites or focal fluid collection.  Musculoskeletal: No aggressive appearing focal osseous lesions. Mild  lumbar spondylosis. Anterolisthesis at L4-5 measures 8 mm.  VASCULAR MEASUREMENTS PERTINENT TO TAVR:  AORTA:  Minimal  Aortic Diameter - 13 x 13 mm (infrarenal abdominal aorta)  Severity of Aortic Calcification - moderate  RIGHT PELVIS:  Right Common Iliac Artery -  Minimal Diameter - 7.2 x 5.1 mm  Tortuosity - mild-to-moderate  Calcification - moderate  There is a moderate focal atherosclerotic stenosis in the right  common iliac artery approximately 2 cm below the aortic bifurcation.  Right External Iliac Artery -  Minimal Diameter - 6.1 x 5.9 mm  Tortuosity - mild  Calcification - mild  Right Common Femoral Artery -  Minimal Diameter - 6.0 x 5.5 mm  Tortuosity - mild  Calcification - moderate to severe  LEFT PELVIS:  Left Common Iliac Artery -  Minimal Diameter - 8.1 x 7.2 mm  Tortuosity - moderate  Calcification - moderate  Left External Iliac Artery -  Minimal Diameter - 6.0 x 5.9 mm  Tortuosity - mild  Calcification - mild  Left Common Femoral Artery -  Minimal Diameter - 5.3 x 3.8 mm  Tortuosity - mild  Calcification - moderate to severe  There is early bifurcation of the left common femoral artery.  Review of the MIP images confirms the above findings.  IMPRESSION:  1. Vascular findings and measurements pertinent to potential TAVR  procedure, as detailed above.  2. Severe thickening and calcification of the aortic valve,  compatible with the reported clinical history of severe aortic  stenosis.  3. Aortic Atherosclerosis (ICD10-I70.0). Left main and 3 vessel  coronary atherosclerosis. Mild cardiomegaly.  4. Right adrenal 1.5 cm nodule with indeterminate density, probably  a benign adenoma. Follow-up adrenal protocol CT abdomen without and  with IV contrast could be considered in 12 months. This  recommendation follows ACR consensus guidelines: Management of  Incidental Adrenal Masses: A White Paper of the ACR Incidental  Findings Committee. J Am Coll Radiol 2017;14:1038-1044.  5. Pneumobilia, correlate for any history of sphincterotomy. Mild  common bile duct dilation (11 mm  diameter) without intrahepatic  dilation. No radiopaque cholelithiasis or choledocholithiasis.  Recommend correlation with serum bilirubin levels. MRI abdomen with  MRCP without and with IV contrast could be considered for further  evaluation as clinically warranted.  6. Additional chronic findings as detailed.  Electronically Signed  By: Ilona Sorrel M.D.  On: 03/01/2017 14:58  Impression:  This 71 year old woman has stage D, severe, symptomatic aortic stenosis with New York Heart Association class III symptoms of exertional shortness of breath and fatigue consistent with chronic diastolic congestive heart failure. She has recently been hospitalized twice with resting shortness of breath and lower extremity edema that was attributed to pneumonia but may have been acute exacerbations of congestive heart failure. I have personally reviewed her recent transthoracic echocardiogram done yesterday as well as her cardiac catheterization and CTA studies. Her echocardiogram yesterday shows a trileaflet aortic valve with severe  calcification, thickening, and reduced mobility of all 3 valve leaflets. The mean gradient was measured at 91 mmHg with a peak gradient of 131 mmHg. The dimensionless index was 0.19. This is consistent with critical aortic stenosis. She also has severely thickened and calcified mitral valve leaflets with a calcified mitral annulus and a mean gradient of 11 mmHg with a peak gradient of 23 mmHg consistent with severe mitral stenosis. Her catheterization shows a 70% left circumflex stenosis with otherwise nonobstructive disease. I think her coronary disease could be treated conservatively with medical therapy. I think her critical aortic stenosis is best treated with aortic valve replacement. Her mitral valve disease is significant but I do not think she is a candidate for open surgical double valve replacement due to her multiple comorbid medical conditions. I think the best option for her is  to treat her critical aortic stenosis with TAVR and continue to follow her mitral stenosis and coronary disease. Her gated cardiac CTA shows anatomy suitable for TAVR using a Sapien 3 valve. Her abdominal and pelvic CTA shows pelvic vascular anatomy that is likely adequate for transfemoral insertion although she has significant atherosclerotic disease involving the common femoral arteries bilaterally. She does have very poor dentition and has been scheduled for evaluation by Dr. Enrique Sack. She will likely require extraction of her remaining teeth.  The patient and her uncle were counseled at length regarding treatment alternatives for management of severe symptomatic aortic stenosis. The risks and benefits of surgical intervention has been discussed in detail. Long-term prognosis with medical therapy was discussed. Alternative approaches such as conventional surgical aortic valve replacement, transcatheter aortic valve replacement, and palliative medical therapy were compared and contrasted at length. This discussion was placed in the context of the patient's own specific clinical presentation and past medical history. All of their questions have been addressed.  Following the decision to proceed with transcatheter aortic valve replacement, a discussion was held regarding what types of management strategies would be attempted intraoperatively in the event of life-threatening complications, including whether or not the patient would be considered a candidate for the use of cardiopulmonary bypass and/or conversion to open sternotomy for attempted surgical intervention. I don't think she would be a candidate for open sternotomy to treat any potential complications that arise and she understands that. The patient has been advised of a variety of complications that might develop including but not limited to risks of death, stroke, paravalvular leak, aortic dissection or other major vascular complications, aortic annulus  rupture, device embolization, cardiac rupture or perforation, mitral regurgitation, acute myocardial infarction, arrhythmia, heart block or bradycardia requiring permanent pacemaker placement, congestive heart failure, respiratory failure, renal failure, pneumonia, infection, other late complications related to structural valve deterioration or migration, or other complications that might ultimately cause a temporary or permanent loss of functional independence or other long term morbidity. The patient provides full informed consent for the procedure as described and all questions were answered.  Plan:  The patient will have a dental evaluation and any required treatment. She has been tentatively scheduled for transfemoral TAVR using a Sapien 3 valve on 03/22/2017. She understands that this may need to be rescheduled depending on her dental evaluation and treatment.  I spent 45 minutes performing this consultation and > 50% of this time was spent face to face counseling and coordinating the care of this patient's critical aortic stenosis.  Gaye Pollack, MD  03/08/2017

## 2017-03-14 NOTE — Transfer of Care (Signed)
Immediate Anesthesia Transfer of Care Note  Patient: Takila Kronberg  Procedure(s) Performed: Extraction of tooth #'s 2-11, 15, 19-28 and 31 with alveoloplasty, bilateral mandibular tori reductions and bilateral mandibular lingual exostoses reductions. (N/A Mouth)  Patient Location: PACU  Anesthesia Type:General  Level of Consciousness: drowsy and patient cooperative  Airway & Oxygen Therapy: Patient Spontanous Breathing and Patient connected to face mask oxygen  Post-op Assessment: Report given to RN and Post -op Vital signs reviewed and stable  Post vital signs: Reviewed and stable  Last Vitals:  Vitals:   03/14/17 0639 03/14/17 1008  BP:    Pulse:    Resp:    Temp: 36.6 C (!) 36.1 C  SpO2:      Last Pain:  Vitals:   03/14/17 1008  TempSrc: Tympanic      Patients Stated Pain Goal: 2 (18/33/58 2518)  Complications: No apparent anesthesia complications

## 2017-03-14 NOTE — Op Note (Signed)
OPERATIVE REPORT  Patient:            Sonya Carpenter Date of Birth:  1946-04-21 MRN:                [id number]   DATE OF PROCEDURE:  03/14/2017  PREOPERATIVE DIAGNOSES: 1.  Severe aortic stenosis 2.  Pre-heart valve surgery dental protocol 3.  Chronic apical periodontitis 4.  Dental caries 5.  Chronic periodontitis 6.  Loose teeth 7.  Multiple retained root segments 8.  Bilateral mandibular lingual tori 9.  Bilateral mandibular lingual exostoses  POSTOPERATIVE DIAGNOSES: 1.  Severe aortic stenosis 2.  Pre-heart valve surgery dental protocol 3.  Chronic apical periodontitis 4.  Dental caries 5.  Chronic periodontitis 6.  Loose teeth 7.  Multiple retained root segments 8.  Bilateral mandibular lingual tori 9.  Bilateral mandibular lingual exostoses  OPERATIONS: 1. Multiple extraction of tooth numbers 2-11, 15, 19-28, and 31 2. 4 Quadrants of alveoloplasty 3.  Bilateral mandibular lingual tori reductions 4.  Bilateral mandibular lingual exostoses reductions  SURGEON: Lenn Cal, DDS  ANESTHESIA: General anesthesia via nasoendotracheal tube.  MEDICATIONS: 1. Ancef 2 g IV prior to invasive dental procedures. 2. Local anesthesia with a total utilization of 6 carpules each containing 34 mg of lidocaine with 0.017 mg of epinephrine as well as 2 carpules each containing 9 mg of bupivacaine with 0.009 mg of epinephrine. 3.  1 unit of packed red blood cells administered during the operating room procedure.  SPECIMENS: There are 22 teeth that were discarded.  DRAINS: None  CULTURES: None  COMPLICATIONS: None   ESTIMATED BLOOD LOSS: 100 mLs.  INTRAVENOUS FLUIDS: 600 mLs of Lactated ringers solution.  INDICATIONS: The patient was recently diagnosed with severe aortic stenosis.  A medically necessary dental consultation was then requested to evaluate poor dentition.  The patient was examined and treatment planned for extraction of remaining teeth with  alveoloplasty and preprosthetic surgery as needed in the operating room with general anesthesia.  This treatment plan was formulated to decrease the risks and complications associated with dental infection from affecting the patient's systemic health and the anticipated heart valve surgery.  OPERATIVE FINDINGS: Patient was examined in operating room number 9.  The teeth were identified for extraction. The patient was noted be affected by chronic apical periodontitis, multiple retained root segments, dental caries, chronic periodontitis, loose teeth, bilateral mandibular lingual tori, and bilateral mandibular lingual exostoses.  DESCRIPTION OF PROCEDURE: Patient was brought to the main operating room number 9. Patient was then placed in the supine position on the operating table.  General anesthesia was then induced per the anesthesia team. The patient was then prepped and draped in the usual manner for a dental medicine procedure. A timeout was performed. The patient was identified and procedures were verified. A throat pack was placed at this time. The oral cavity was then thoroughly examined with the findings noted above. The patient was then ready for dental medicine procedure as follows:  Local anesthesia was then administered sequentially with a total utilization of 6 carpules each containing 34 mg of lidocaine with 0.017 mg of epinephrine as well as 2 carpules  each containing 9 mg bupivacaine with 0.009 mg of epinephrine.  The Maxillary left and right quadrants first approached. Anesthesia was then delivered utilizing infiltration with lidocaine with epinephrine. A #15 blade incision was then made from the maxillary right tuberosity and extended to the maxillary left tuberosity.  A  surgical flap was then carefully reflected.  The maxillary teeth were then subluxated with a series of straight elevators.  Tooth numbers 2, 3, 4-11, 15 were then removed with a 150 forceps without complications.   Alveoloplasty was then performed utilizing a ronguers and bone file to help achieve primary closure. The surgical site was then irrigated with copious amounts of sterile saline x 4. The tissues were approximated and trimmed appropriately.  The maxillary right surgical site was then closed from the maxillary tuberosity and extending the mesial #8 utilizing 3-0 chromic gut suture in a continuous interrupted suture technique x1.  At this point time a piece of Surgicel was placed the extraction socket #15.  The maxillary left surgical site was then closed from the maxillary left tuberosity and extended to the mesial #9 utilizing 3-0 chromic gut suture in a continuous interrupted suture technique x1.  At this point time, the mandibular quadrants were approached. The patient was given bilateral inferior alveolar nerve blocks and long buccal nerve blocks utilizing the bupivacaine with epinephrine. Further infiltration was then achieved utilizing the lidocaine with epinephrine. A 15 blade incision was then made from the distal of number 17 and extended to the distal of #32. A surgical flap was then carefully reflected.  The lower teeth were then subluxated with a series of straight elevators.  Tooth numbers  19-28 were then removed utilizing a 151 forceps without complication.  Tooth #31 was then removed with a 23 forceps without complication. Alveoloplasty was then performed utilizing a rongeurs and bone file.  The lingual flaps were then reflected bilaterally to expose the bilateral mandibular lingual tori and bilateral mandibular lingual exostoses.  These were then reduced utilizing a surgical handpiece and bur and copious amounts of sterile water.  Further alveoloplasty was then performed utilizing a rongeurs and bone file to help achieve primary closure.  The tissues were approximated and trimmed appropriately. The surgical sites were then irrigated with copious amounts of sterile saline x4. The mandibular left  surgical site was then closed from the distal of #17 and extended to the mesial of 24 utilizing 3-0 chromic gut suture in a continuous interrupted suture technique x1.  The mandibular right surgical site was then closed from the distal of 32 and extended to the mesial of 25 utilizing 3-0 chromic gut suture in a continuous interrupted suture technique x1.  3 interrupted sutures were then placed to further close the surgical site utilizing 4-0 chromic material.  At this point time, the entire mouth was irrigated with copious amounts of sterile saline. The patient was examined for complications, seeing none, the dental medicine procedure was deemed to be complete. The throat pack was removed at this time. A series of 4 x 4 gauze moistened with Amicar 5% rinse were placed in the mouth to aid hemostasis. The patient was then handed over to the anesthesia team for final disposition. After an appropriate amount of time, the patient was extubated and taken to the postanesthsia care unit in good condition. All counts were correct for the dental medicine procedure. The patient is to continue the Amicar rinses postoperatively.  Patient is to rinse with 10 mL's every hour for the next 10 hours in a swish and spit manner and then as needed for persistent oozing.  Dietary consultation has been requested for edentulous condition.  Patient is to be admitted postoperatively by the TCTS team for observation postoperatively and further work up for the TAVR procedure scheduled for next week.   Lenn Cal, DDS.

## 2017-03-14 NOTE — Progress Notes (Addendum)
      [address]       [place] [address]             [phone]     CARDIOTHORACIC SURGERY PROGRESS NOTE  Day of Surgery  S/P Procedure(s) (LRB): Extraction of tooth #'s 2-11, 15, 19-28 and 31 with alveoloplasty, bilateral mandibular tori reductions and bilateral mandibular lingual exostoses reductions. (N/A)  Subjective: Sore from dental extraction and mouth swollen.  Denies SOB.  Reports that stools are typically black because she takes iron at home.  Denies any significant change in bowel function  Objective: Vital signs in last 24 hours: Temp:  [97 F (36.1 C)-97.9 F (36.6 C)] 97.9 F (36.6 C) (11/19 1242) Pulse Rate:  [84-100] 88 (11/19 1242) Cardiac Rhythm: Normal sinus rhythm (11/19 1240) Resp:  [15-20] 16 (11/19 1242) BP: (119-142)/(51-67) 135/63 (11/19 1242) SpO2:  [96 %-100 %] 96 % (11/19 1500) Arterial Line BP: (124-149)/(48-59) 125/48 (11/19 1210) Weight:  [104 lb 12.8 oz (47.5 kg)-110 lb (49.9 kg)] 104 lb 12.8 oz (47.5 kg) (11/19 1242)  Physical Exam:  Rhythm:   sinus  Breath sounds: Few bibasilar crackles  Heart sounds:  RRR w/ systolic murmur  Incisions:  n/a  Abdomen:  soft  Extremities:  warm   Intake/Output from previous day: No intake/output data recorded. Intake/Output this shift: Total I/O In: 915 [I.V.:600; Blood:315] Out: 1800 [Urine:1650; Blood:150]  Lab Results: Recent Labs    03/14/17 0626 03/14/17 1130 03/14/17 1516  WBC 6.8  --  8.3  HGB 8.5* 9.5* 10.2*  HCT 25.6* 28.5* 30.7*  PLT 121*  --  PENDING   BMET:  Recent Labs    03/14/17 0626 03/14/17 1516  NA 137 138  K 4.7 5.4*  CL 107 110  CO2 18* 19*  GLUCOSE 103* 162*  BUN 98* 86*  CREATININE 2.72* 2.61*  CALCIUM 10.2 9.7    CBG (last 3)  Recent Labs    03/14/17 1008 03/14/17 1217 03/14/17 1705  GLUCAP 186* 176* 138*   PT/INR:   Recent Labs    03/14/17 1516  LABPROT 15.4*  INR 1.23    CXR:  N/A  Assessment/Plan: S/P Procedure(s)  (LRB): Extraction of tooth #'s 2-11, 15, 19-28 and 31 with alveoloplasty, bilateral mandibular tori reductions and bilateral mandibular lingual exostoses reductions. (N/A)  Patient well known to me from recent outpatient consultation and TAVR workup.  I agree w/ plans outlined by Nell Range and Dr Angelena Form.  Will follow closely.  Rexene Alberts, MD 03/14/2017 5:49 PM

## 2017-03-14 NOTE — Anesthesia Postprocedure Evaluation (Signed)
Anesthesia Post Note  Patient: Sonya Carpenter  Procedure(s) Performed: Extraction of tooth #'s 2-11, 15, 19-28 and 31 with alveoloplasty, bilateral mandibular tori reductions and bilateral mandibular lingual exostoses reductions. (N/A Mouth)     Patient location during evaluation: PACU Anesthesia Type: General Level of consciousness: awake and alert Pain management: pain level controlled Vital Signs Assessment: post-procedure vital signs reviewed and stable Respiratory status: spontaneous breathing, nonlabored ventilation and respiratory function stable Cardiovascular status: blood pressure returned to baseline and stable Postop Assessment: no apparent nausea or vomiting Anesthetic complications: no    Last Vitals:  Vitals:   03/14/17 1140 03/14/17 1210  BP: 136/63 124/63  Pulse: 100 84  Resp: 20 15  Temp:    SpO2: 97% 97%    Last Pain:  Vitals:   03/14/17 1210  TempSrc:   PainSc: Asleep                 Arriyana Rodell,W. EDMOND

## 2017-03-14 NOTE — Progress Notes (Signed)
Patient drank water with no complications, no straw used. Patient would like to try apple sauce and then take her pills crushed with applesauce. Will trial applesauce and adjust diet accordingly.

## 2017-03-14 NOTE — Progress Notes (Signed)
Bruise noted to right hand

## 2017-03-14 NOTE — Anesthesia Procedure Notes (Signed)
Procedure Name: Intubation Date/Time: 03/14/2017 8:02 AM Performed by: Freddie Breech, CRNA Pre-anesthesia Checklist: Patient identified, Emergency Drugs available, Suction available and Patient being monitored Patient Re-evaluated:Patient Re-evaluated prior to induction Oxygen Delivery Method: Circle System Utilized Preoxygenation: Pre-oxygenation with 100% oxygen Induction Type: IV induction Ventilation: Mask ventilation without difficulty Laryngoscope Size: Glidescope and 4 Grade View: Grade I Nasal Tubes: Nasal Rae, Nasal prep performed and Right Tube size: 6.5 mm Number of attempts: 1 Placement Confirmation: ETT inserted through vocal cords under direct vision,  positive ETCO2 and breath sounds checked- equal and bilateral Secured at: 27 cm Tube secured with: Tape Dental Injury: Teeth and Oropharynx as per pre-operative assessment

## 2017-03-14 NOTE — H&P (Signed)
[name]   Cardiology Admission History and Physical:   Patient ID: Sonya Carpenter; [id number]; 05-26-1945   Admission date: 03/14/2017  Primary Care Provider: Michell Heinrich, DO Primary Cardiologist:  Dr. Alroy Dust [facility], [place]) / Dr. Burt Knack & Dr. Roxy Manns (TAVR)  Chief Complaint:  Abnormal pre op labs  Patient Profile:   Sonya Carpenter is a 71 y.o. female with a history of CKD stage IV, insulin dependant DMT2 with multiple complications, HTN, asthma, COPD 2/2 second hand smoke exposure, chronic diastolic CHF and severe aortic stenosis (undergoing TAVR work up) who presented to [facility] today for planned dental extractions. Pre operative labs revealed AKI and new anemia. She underwent successful dental extractions of her remaining 22 teeth and will be admitted for observation and further work up.   History of Present Illness:   Ms. Basic has known of the presence of a heart murmur for several years.  She was seen approximately 3 years ago by Dr. Rosalita Chessman and echocardiogram reportedly revealed findings consistent with moderate aortic stenosis.  Patient was lost to follow-up for a period of time primarily because her husband was in poor health.  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  During that hospitalization she was thought to have a possible abdominal mass with biliary ductal dilitation. 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  She did not have an abdominal mass but she was noted to have some gallstones with intermittent episodes of right upper quadrant abdominal discomfort brought on with large meals.  Elective cholecystectomy was planned but preoperative cardiac  clearance requested due to the presence of her aortic stenosis.  She was subsequently readmitted to the hospital in [place] in September with shortness of breath associated with large right pleural effusion requiring thoracentesis.  She was treated for possible pneumonia.  Transthoracic echocardiogram revealed severe aortic stenosis with peak and mean transvalvular gradients reported 121 and 69 mmHg, respectively.  Left ventricular systolic function remains normal with ejection fraction estimated 65%.  She was referred to Dr. Burt Knack for consultation and underwent left and right heart catheterization by Dr. Angelena Form on February 09, 2017.  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  Peak to peak and mean transvalvular gradients across the aortic valve were measured 35 and 18.2 mmHg corresponding to aortic valve area calculated 1.26 cm squared.  She had pre op labs that resulted this morning showed AKI with creat up from 1.86--> 2.72 and new anemia with Hg dropping from 12.7--> 8.5 from 1 month ago. She was able to undergo successful dental extraction. There was an EBL of ~100 ccs. She was transfused 1 U PRBCs intraoperatively. She is currently recovering in the PACU but still somnolent from anesthesia with extensive packing in her mouth but can be aroused to answer a few questions with a head shake. She denies any current pain or difficulty breathing. Plan will be to admit her for observation.    Past Medical History:  Diagnosis Date  . Anemia   . Aortic stenosis   . Arthritis    back  . Asthma   . CAD (coronary artery disease)   . Chronic diastolic CHF (congestive heart failure) ([place])   .  CKD (chronic kidney disease), stage IV ([place])   . COPD (chronic obstructive pulmonary disease) ([place])   . Diabetes mellitus without complication ([place])    type 2  . Gallbladder disease   . Hyperlipidemia   .  Hypertension   . Vitamin D deficiency     Past Surgical History:  Procedure Laterality Date  . BREAST CYST EXCISION Right   . COLONSCOPY    . ELBOW SURGERY Left   . GALLBLADDER SURGERY    . KNEE SURGERY    . REFRACTIVE SURGERY    . RIGHT/LEFT HEART CATH AND CORONARY ANGIOGRAPHY N/A 02/09/2017   Performed by Burnell Blanks, MD at [facility]  . TONSILLECTOMY       Medications Prior to Admission: Prior to Admission medications   Medication Sig Start Date End Date Taking? Authorizing Provider  acetaminophen (TYLENOL) 500 MG tablet Take 500 mg by mouth every 6 (six) hours as needed for mild pain or moderate pain.   Yes [provider]  albuterol (PROVENTIL HFA;VENTOLIN HFA) 108 (90 Base) MCG/ACT inhaler Inhale 2 puffs every 6 (six) hours as needed into the lungs for wheezing or shortness of breath.    Yes [provider]  albuterol (PROVENTIL) (2.5 MG/3ML) 0.083% nebulizer solution Take 2.5 mg 3 (three) times daily as needed by nebulization for wheezing or shortness of breath.   Yes [provider]  amLODipine (NORVASC) 5 MG tablet Take 5 mg by mouth daily.   Yes [provider]  aspirin EC 81 MG tablet Take 81 mg by mouth daily.   Yes [provider]  atorvastatin (LIPITOR) 20 MG tablet Take 20 mg by mouth daily. 01/31/17  Yes [provider]  budesonide-formoterol (SYMBICORT) 160-4.5 MCG/ACT inhaler Inhale 2 puffs 2 (two) times daily as needed into the lungs (for shortness of breath).    Yes [provider]  cetirizine (ZYRTEC) 10 MG tablet Take 10 mg at bedtime by mouth.    Yes [provider]  Dextran 70-Hypromellose (ARTIFICIAL TEARS) 0.1-0.3 % SOLN Place 2 drops as needed into both eyes (for dry eyes).    Yes [provider]  docusate sodium (COLACE) 100 MG capsule Take 100 mg 2 (two) times daily as needed by mouth for mild constipation.   Yes [provider]  ferrous sulfate 325  (65 FE) MG EC tablet Take 325 mg by mouth 2 (two) times daily.    Yes [provider]  furosemide (LASIX) 40 MG tablet Take 1 tablet (40 mg total) by mouth daily. 02/04/17 05/05/17 Yes [name], MD  ipratropium (ATROVENT) 0.02 % nebulizer solution Take 0.5 mg by nebulization 2 (two) times daily.   Yes [provider]  LANTUS SOLOSTAR 100 UNIT/ML Solostar Pen Inject 30 Units every morning into the skin.  10/29/16  Yes [provider]  losartan (COZAAR) 50 MG tablet Take 50 mg by mouth daily.    Yes [provider]  montelukast (SINGULAIR) 10 MG tablet Take 10 mg by mouth at bedtime.   Yes [provider]  Multiple Vitamins-Minerals (MULTIVITAMIN WITH MINERALS) tablet Take 1 tablet by mouth daily.   Yes [provider]  Naphazoline HCl (CLEAR EYES OP) Place 2 drops 2 (two) times daily into both eyes.   Yes [provider]  potassium chloride (K-DUR) 10 MEQ tablet Take 1 tablet (10 mEq total) by mouth daily. 02/04/17 05/05/17 Yes [name], MD  sodium chloride (OCEAN) 0.65 % SOLN nasal spray Place 2  sprays 2 (two) times daily as needed into both nostrils for congestion.    Yes [provider]  traMADol (ULTRAM) 50 MG tablet Take 50 mg every 6 (six) hours as needed by mouth for moderate pain.    Yes [provider]  lubiprostone (AMITIZA) 8 MCG capsule Take 8 mcg 2 (two) times daily as needed by mouth for constipation.     [provider]     Allergies:   No Known Allergies  Social History:   Social History   Socioeconomic History  . Marital status: Widowed    Spouse name: Not on file  . Number of children: 0  . Years of education: Not on file  . Highest education level: Not on file  Social Needs  . Financial resource strain: Not on file  . Food insecurity - worry: Not on file  . Food insecurity - inability: Not on file  . Transportation needs - medical: Not on file  . Transportation needs -  non-medical: Not on file  Occupational History  . Not on file  Tobacco Use  . Smoking status: Passive Smoke Exposure - Never Smoker  . Smokeless tobacco: Never Used  Substance and Sexual Activity  . Alcohol use: No    Frequency: Never  . Drug use: No  . Sexual activity: No  Other Topics Concern  . Not on file  Social History Narrative   Here with Uncle and [place].    Family History:   The patient's family history includes Cancer in her sister; Diabetes in her father; Heart disease in her father and mother; Hypertension in her father and mother; Kidney failure in her mother.    ROS:  Please see the history of present illness.  ROS unable to be obtained given extensive packing in mouth and still somnolent from surgery.      Physical Exam/Data:   Vitals:   03/14/17 1038 03/14/17 1053 03/14/17 1108 03/14/17 1140  BP: (!) 119/53 (!) 122/57 (!) 131/57 136/63  Pulse: 85 89 93 100  Resp: 17 19 18 20   Temp:   (!) 97.5 F (36.4 C)   TempSrc:      SpO2: 97% 98% 97% 97%  Weight:      Height:        Intake/Output Summary (Last 24 hours) at 03/14/2017 1143 Last data filed at 03/14/2017 1142 Gross per 24 hour  Intake 915 ml  Output 600 ml  Net 315 ml   Filed Weights   03/14/17 0628  Weight: 110 lb (49.9 kg)   Body mass index is 22.99 kg/m.  General: chronically ill appearing white female. Recovering in PACU. Somnolent. Extensive dental packing.  HEENT: normal Lymph: no adenopathy Neck: noJVD Endocrine:  No thryomegaly Vascular: No carotid bruits; FA pulses 2+ bilaterally without bruits  Cardiac:  normal S1, S2; RRR; 4/6 SEM @ RUSB Lungs:  clear to auscultation bilaterally, no wheezing, rhonchi or rales  Abd: soft, nontender, no hepatomegaly  Ext: no edema Musculoskeletal:  No deformities Skin: warm and dry  Neuro:  Grossly non focal  Psych:  Normal affect   EKG: no EKG this admission.   Relevant CV Studies: 2D ECHO: 03/07/2017 Study Conclusions - Left  ventricle: The cavity size was normal. There was moderate   concentric hypertrophy. Systolic function was vigorous. The   estimated ejection fraction was in the range of 65% to 70%. Wall   motion was normal; there were no regional wall motion   abnormalities. Doppler parameters are  consistent with abnormal   left ventricular relaxation (grade 1 diastolic dysfunction).   Doppler parameters are consistent with both elevated ventricular   end-diastolic filling pressure and elevated left atrial filling   pressure. - Aortic valve: Trileaflet; severely thickened, severely calcified   leaflets. Valve mobility was restricted. There was trivial   regurgitation. Mean gradient (S): 91 mm Hg. Peak gradient (S):   131 mm Hg. - Ascending aorta: The ascending aorta was normal in size. - Mitral valve: Calcified annulus. Severely thickened, severely   calcified leaflets . Mobility was restricted. The findings are   consistent with severe stenosis. There was mild regurgitation.   Peak gradient (D): 23 mm Hg. - Left atrium: The atrium was severely dilated. - Right ventricle: The cavity size was normal. Wall thickness was   normal. Systolic function was normal. - Pulmonary arteries: Systolic pressure was within the normal   range. - Inferior vena cava: The vessel was normal in size. Impressions: - Normal LVEF. Severely elevated LVEDP.   Critical aortic stenosis with peak/mean transaortic gradients   131/91 mmHg.   Severe mitral stenosis with mean gradient 11 mmHg.  R/LHC 02/09/17 Conclusion    Mid RCA to Dist RCA lesion, 40 %stenosed.  Prox RCA to Mid RCA lesion, 20 %stenosed.  Ost Cx to Prox Cx lesion, 70 %stenosed.  Ost 3rd Mrg to 3rd Mrg lesion, 40 %stenosed.  Prox LAD to Mid LAD lesion, 30 %stenosed.  Dist LAD lesion, 20 %stenosed.  Ost 1st Diag lesion, 40 %stenosed.  Ost 2nd Diag to 2nd Diag lesion, 30 %stenosed.  There is severe aortic valve stenosis.   1. Single vessel CAD  with moderately severe, calcified stenosis in the proximal Circumflex artery 2. Mild non-obstructive disease in the RCA and LAD 3. Severe aortic stenosis (mean gradient 18.41mmHg, peak gradient 35 mmHg, AVA 1.26 cm2 ).  Recommendations: Will review films with the TAVR team. We may be able to manage her CAD medically. Will continue workup for TAVR.     Laboratory Data:  Chemistry Recent Labs  Lab 03/14/17 0626  NA 137  K 4.7  CL 107  CO2 18*  GLUCOSE 103*  BUN 98*  CREATININE 2.72*  CALCIUM 10.2  GFRNONAA 17*  GFRAA 19*  ANIONGAP 12    No results for input(s): PROT, ALBUMIN, AST, ALT, ALKPHOS, BILITOT in the last 168 hours. Hematology Recent Labs  Lab 03/14/17 0626  WBC 6.8  RBC 2.83*  HGB 8.5*  HCT 25.6*  MCV 90.5  MCH 30.0  MCHC 33.2  RDW 14.9  PLT 121*   Cardiac EnzymesNo results for input(s): TROPONINI in the last 168 hours. No results for input(s): TROPIPOC in the last 168 hours.  BNPNo results for input(s): BNP, PROBNP in the last 168 hours.  DDimer No results for input(s): DDIMER in the last 168 hours.  Radiology/Studies:  No results found.  Assessment and Plan:   Pre operative labs show AKI and new anemia. Creat up from 1.86--> 2.72 and Hg dropping from 12.7--> 8.5 from 1 month ago. She was able to undergo successful dental extraction this morning. There was an EBL of ~100 ccs. She was transfused 1 U PRBCs intraoperatively. She is hemodynamically stable.   PLAN: We will admit her for observation and further work up. I will start some IV hydration and repeat lab work. I will also order a FOBT and treat dental pain. SSI. HOLD LASIX and ARB.   Severity of Illness: The appropriate patient status for this patient  is OBSERVATION. Observation status is judged to be reasonable and necessary in order to provide the required intensity of service to ensure the patient's safety. The patient's presenting symptoms, physical exam findings, and initial radiographic and  laboratory data in the context of their medical condition is felt to place them at decreased risk for further clinical deterioration. Furthermore, it is anticipated that the patient will be medically stable for discharge from the hospital within 2 midnights of admission. The following factors support the patient status of observation.   " The patient's presenting symptoms include abnormal lab work  " The physical exam findings include chronically ill appearing white female recovering in PACU with extensive dental packing and still somnolent  " The initial radiographic and laboratory data are AKI and new anemia.     Signed, Angelena Form, PA-C  03/14/2017 11:43 AM   I have personally seen and examined this patient with Angelena Form, PA-C. I agree with the assessment and plan as outlined above. Ms. Pienta is being followed in our valve clinic by Dr. Burt Knack and Dr. Roxy Manns. Planning is underway for TAVR. She came in today for dental extraction and her pre-operative labs showed worsened renal function and new anemia. She was given one unit of blood in the OR. She is being admitted for IV hydration and serial measurement of her H/H post op. She denies dark stools or obvious source of bleeding. My exam shows:   General: Well developed, well nourished, NAD  HEENT: Gauze in mouth.  SKIN: warm, dry. No rashes. Neuro: No focal deficits  Musculoskeletal: Muscle strength 5/5 all ext  Psychiatric: Mood and affect normal  Neck: No JVD, no carotid bruits, no thyromegaly, no lymphadenopathy.  Lungs:Clear bilaterally, no wheezes, rhonci, crackles Cardiovascular: Regular rate and rhythm. Loud, harsh, late peaking systolic murmur. No gallops or rubs. Abdomen:Soft. Bowel sounds present. Non-tender.  Extremities: No lower extremity edema. Pulses are 2 + in the bilateral DP/PT.  Labs reviewed by me.   Her H/H was 12.7/39.6 one month ago and today it is 8.5/25.6. We will begin workup for anemia tonight  and will send stool cards. Repeat H/H in am. Gentle hydration tonight and repeat BMET in the am. The TAVR team will see her in the am.   Lauree Chandler 03/14/2017 5:32 PM

## 2017-03-14 NOTE — Progress Notes (Signed)
PRE-OPERATIVE NOTE:  03/14/2017 Sonya Carpenter [id number]  VITALS: BP (!) 135/51   Pulse 87   Temp 97.9 F (36.6 C) (Oral)   Resp 18   Ht 4\' 10"  (1.473 m)   Wt 110 lb (49.9 kg)   SpO2 100%   BMI 22.99 kg/m   CBC Latest Ref Rng & Units 03/14/2017 02/04/2017  WBC 4.0 - 10.5 K/uL 6.8 6.8  Hemoglobin 12.0 - 15.0 g/dL 8.5(L) 12.7  Hematocrit 36.0 - 46.0 % 25.6(L) 39.6  Platelets 150 - 400 K/uL 121(L) 117(L)    BMET    Component Value Date/Time   NA 137 03/14/2017 0626   NA 142 02/04/2017 1226   K 4.7 03/14/2017 0626   CL 107 03/14/2017 0626   CO2 18 (L) 03/14/2017 0626   GLUCOSE 103 (H) 03/14/2017 0626   BUN 98 (H) 03/14/2017 0626   BUN 51 (H) 02/04/2017 1226   CREATININE 2.72 (H) 03/14/2017 0626   CALCIUM 10.2 03/14/2017 0626   GFRNONAA 17 (L) 03/14/2017 0626   GFRAA 19 (L) 03/14/2017 0626     Lab Results  Component Value Date   INR 1.1 02/04/2017   No results found for: PTT   Vaughan Basta Pedretti presents for extraction of remaining teeth with alveoloplasty and pre-prosthetic surgery as needed in the operating room and general anesthesia.  Labs taken this morning and were reviewed with anesthesiologist, Dr. Ola Spurr.  We will plan on transfusion with RBC's during the procedure. Kidney dysfunction noted.   SUBJECTIVE: The patient denies any acute medical or dental changes and agrees to proceed with treatment as planned.  EXAM: No sign of acute dental changes.  ASSESSMENT: Patient is affected by chronic apical periodontitis, multiple retained root segments, dental caries, chronic periodontitis, loose teeth, bilateral mandibular lingual tori, and lateral exostoses. Patient with worsening anemia and kidney dysfunction.  PLAN: Patient agrees to proceed with treatment as planned in the operating room as previously discussed and accepts the risks, benefits, and complications of the proposed treatment. Patient is aware of the risk for bleeding, bruising, swelling,  infection, pain, nerve damage, soft tissue damage, sinus involvement, root tip fracture, mandible fracture, and the risks of complications associated with the anesthesia. Patient also is aware of the potential for other complications up to and including death due to her overall cardiovascular and respiratory compromise. Patient understands that transfusion will be given during the oral surgery procedure. Will consider admission after dental procedure after discussion with Cardiology/TCTS.    Lenn Cal, DDS

## 2017-03-14 NOTE — Anesthesia Procedure Notes (Signed)
Arterial Line Insertion Start/End11/19/2018 7:07 AM, 03/14/2017 7:34 AM Performed by: Roderic Palau, MD, Kyung Rudd, CRNA  Patient location: Pre-op. Preanesthetic checklist: patient identified, IV checked, site marked, risks and benefits discussed, surgical consent, monitors and equipment checked, pre-op evaluation, timeout performed and anesthesia consent Lidocaine 1% used for infiltration radial was placed Catheter size: 20 Fr Hand hygiene performed  and maximum sterile barriers used  Allen's test indicative of satisfactory collateral circulation Attempts: 3 Ephraim Hamburger CRNA unable to thread arterial catheter in right artery.  ) Procedure performed without using ultrasound guided technique. Following insertion, dressing applied. Post procedure assessment: normal and unchanged

## 2017-03-15 ENCOUNTER — Other Ambulatory Visit: Payer: Self-pay

## 2017-03-15 ENCOUNTER — Encounter (HOSPITAL_COMMUNITY): Payer: Self-pay | Admitting: Dentistry

## 2017-03-15 DIAGNOSIS — Z01818 Encounter for other preprocedural examination: Secondary | ICD-10-CM

## 2017-03-15 DIAGNOSIS — N179 Acute kidney failure, unspecified: Secondary | ICD-10-CM | POA: Diagnosis not present

## 2017-03-15 DIAGNOSIS — I350 Nonrheumatic aortic (valve) stenosis: Secondary | ICD-10-CM

## 2017-03-15 DIAGNOSIS — K029 Dental caries, unspecified: Secondary | ICD-10-CM | POA: Diagnosis not present

## 2017-03-15 LAB — GLUCOSE, CAPILLARY
GLUCOSE-CAPILLARY: 284 mg/dL — AB (ref 65–99)
Glucose-Capillary: 98 mg/dL (ref 65–99)
Glucose-Capillary: 272 mg/dL — ABNORMAL HIGH (ref 65–99)
Glucose-Capillary: 137 mg/dL — ABNORMAL HIGH (ref 65–99)

## 2017-03-15 LAB — CBC
HEMATOCRIT: 30 % — AB (ref 36.0–46.0)
HEMOGLOBIN: 9.6 g/dL — AB (ref 12.0–15.0)
MCH: 28.7 pg (ref 26.0–34.0)
MCHC: 32 g/dL (ref 30.0–36.0)
MCV: 89.6 fL (ref 78.0–100.0)
Platelets: 112 10*3/uL — ABNORMAL LOW (ref 150–400)
RBC: 3.35 MIL/uL — ABNORMAL LOW (ref 3.87–5.11)
RDW: 14.9 % (ref 11.5–15.5)
WBC: 6.2 10*3/uL (ref 4.0–10.5)

## 2017-03-15 LAB — BASIC METABOLIC PANEL
Anion gap: 10 (ref 5–15)
BUN: 76 mg/dL — ABNORMAL HIGH (ref 6–20)
CHLORIDE: 113 mmol/L — AB (ref 101–111)
CO2: 18 mmol/L — AB (ref 22–32)
CREATININE: 2.45 mg/dL — AB (ref 0.44–1.00)
Calcium: 9.3 mg/dL (ref 8.9–10.3)
GFR calc non Af Amer: 19 mL/min — ABNORMAL LOW (ref >60)
GFR, EST AFRICAN AMERICAN: 22 mL/min — AB (ref >60)
Glucose, Bld: 108 mg/dL — ABNORMAL HIGH (ref 65–99)
Potassium: 4.5 mmol/L (ref 3.5–5.1)
Sodium: 141 mmol/L (ref 135–145)

## 2017-03-15 LAB — OCCULT BLOOD X 1 CARD TO LAB, STOOL: Fecal Occult Bld: NEGATIVE

## 2017-03-15 MED ORDER — PREMIER PROTEIN SHAKE
11.0000 [oz_av] | Freq: Three times a day (TID) | ORAL | Status: DC
  Administered 2017-03-15 – 2017-03-16 (×3): 11 [oz_av] via ORAL
  Filled 2017-03-15 (×8): qty 325.31

## 2017-03-15 MED ORDER — AMLODIPINE BESYLATE 10 MG PO TABS
10.0000 mg | ORAL_TABLET | Freq: Every day | ORAL | Status: DC
  Administered 2017-03-16: 10 mg via ORAL
  Filled 2017-03-15: qty 1

## 2017-03-15 MED ORDER — GLUCERNA SHAKE PO LIQD
237.0000 mL | Freq: Three times a day (TID) | ORAL | Status: DC
  Administered 2017-03-15 – 2017-03-16 (×4): 237 mL via ORAL

## 2017-03-15 MED ORDER — AMINOCAPROIC ACID SOLUTION 5% (50 MG/ML)
10.0000 mL | ORAL | Status: AC
  Administered 2017-03-15 (×7): 10 mL via ORAL
  Filled 2017-03-15 (×2): qty 100

## 2017-03-15 NOTE — Care Management Obs Status (Signed)
[place] NOTIFICATION   Patient Details  Name: Sonya Carpenter MRN: [id number] Date of Birth: 03-31-1946   Medicare Observation Status Notification Given:  Yes    Carles Collet, RN 03/15/2017, 9:16 AM

## 2017-03-15 NOTE — Progress Notes (Signed)
Initial Nutrition Assessment  DOCUMENTATION CODES:   Not applicable  INTERVENTION:   -Premier Protein TID  NUTRITION DIAGNOSIS:   Inadequate oral intake related to (masticatory difficulty s/p dental extractions) as evidenced by meal completion < 50%, per patient/family report.  GOAL:   Patient will meet greater than or equal to 90% of their needs  MONITOR:   PO intake, Supplement acceptance, Diet advancement, Labs, Weight trends, Skin, I & O's  REASON FOR ASSESSMENT:   Consult Assessment of nutrition requirement/status  ASSESSMENT:   Sonya Carpenter is a 71 y.o. 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 Pre operative labs revealed AKI and new anemia. She underwent successful dental extractions of her remaining 22 teeth and will be admitted for observation and further work up.   S/P Procedure(s) (LRB) 03/14/17: Extraction of tooth #'s 2-11, 15, 19-28 and 31 with alveoloplasty, bilateral mandibular tori reductions and bilateral mandibular lingual exostoses reductions. (N/A)  Spoke with pt at bedside. Pt communicated mostly via head nods and hand gestures, due to jaw swelling and ice packs around mouth. She reports she had a good appetite PTA and consumed 3 meals per day.   She reports it is very difficult to eat currently. She has mainly been consuming pureed texture foods or liquids, such as applesauce, pudding, grits, and liquids. She was looking at menu and tells this RD "I want you to pick my foods for me". RD reviewed menu choices with pt, focusing on soft textured foods such as yogurt, mashed potatoes, soups, and cottage cheese. RD provided encouragement and suspect swallowing and chewing will become easier once pt recovers.   Pt reports she has  lost about 5# over the past few months. Noted a 9# a 8.1% wt loss over the past moth, however, unsure of accuracy of current wt of wt hx.   Pt reports she consumed Ensure Max Protein at home; will order Premier Protein, which is of similar nutritional profile and on formulary.She reports she will be coming back next week for TAVR.   Pt with poor glycemic control PTA; noted last Hgb A1c: 8.0. Home DM regimen 30 units lantus solostar 30 units q AM. Pt shares blood sugars "go up and down" at baseline.   Labs reviewed: CBGS: 94-138 (inpatient orders for glycemic control are insulin aspart 0-15 units TID with meals).  NUTRITION - FOCUSED PHYSICAL EXAM:    Most Recent Value  Orbital Region  No depletion  Upper Arm Region  Mild depletion  Thoracic and Lumbar Region  No depletion  Buccal Region  No depletion  Temple Region  No depletion  Clavicle Bone Region  Mild depletion  Clavicle and Acromion Bone Region  No depletion  Scapular Bone Region  No depletion  Dorsal Hand  No depletion  Patellar Region  Mild depletion  Anterior Thigh Region  Mild depletion  Posterior Calf Region  Mild depletion  Edema (RD Assessment)  Mild  Hair  Reviewed  Eyes  Reviewed  Mouth  Reviewed  Skin  Reviewed  Nails  Reviewed       Diet Order:  DIET SOFT Room service appropriate? Yes; Fluid consistency: Thin  EDUCATION NEEDS:   Education needs have been addressed  Skin:  Skin Assessment: Skin Integrity Issues: Skin Integrity Issues:: Incisions Incisions: lip  Last BM:  03/13/17  Height:   Ht Readings from Last 1 Encounters:  03/14/17 4\' 10"  (1.473 m)    Weight:   Wt Readings from Last 1 Encounters:  03/15/17 101 lb 3.2 oz (45.9 kg)    Ideal Body Weight:  44.1 kg  BMI:  Body mass index is 21.15 kg/m.  Estimated Nutritional Needs:   Kcal:  1300-1500  Protein:  60-75 grams  Fluid:  1.3-1.5 L    Sherrick Araki A. Jimmye Norman, RD, LDN, CDE Pager: [phone] After hours Pager: [phone]

## 2017-03-15 NOTE — Plan of Care (Signed)
  Health Behavior/Discharge Planning: Ability to manage health-related needs will improve 03/15/2017 2231 - Progressing by Tristan Schroeder, RN   Clinical Measurements: Ability to maintain clinical measurements within normal limits will improve 03/15/2017 2231 - Progressing by Tristan Schroeder, RN   Skin Integrity: Risk for impaired skin integrity will decrease 03/15/2017 2231 - Progressing by Tristan Schroeder, RN   Pain Managment: General experience of comfort will improve 03/15/2017 2231 - Progressing by Tristan Schroeder, RN

## 2017-03-15 NOTE — Progress Notes (Addendum)
Past orders received to give PRBCs x 2 in surgery.  One unit was given in tooth extraction sx.  Progress Note (03/14/17 @ 1748) written by Dr. Angelena Form states that pt is "hemodynamically stable."  Current Hbg 9.6.   Requested orders to discontinue the 2nd unit of blood r/t hemoglobin lab results. Ok not to give 2nd unit per [employer].

## 2017-03-15 NOTE — Plan of Care (Signed)
  Clinical Measurements: Ability to maintain clinical measurements within normal limits will improve 03/15/2017 0238 - Progressing by Tristan Schroeder, RN   Clinical Measurements: Will remain free from infection 03/15/2017 0238 - Progressing by Tristan Schroeder, RN

## 2017-03-15 NOTE — Progress Notes (Signed)
Provided education around advanced directive for patient.  Patient wants to name a cousin to be her agent.  She will speak with her and then we will complete this process with notarization and witnesses.  Please place consult or page chaplain if patient is ready before we return.    03/15/17 1229  Clinical Encounter Type  Visited With Patient  Visit Type Initial;Spiritual support

## 2017-03-15 NOTE — Progress Notes (Signed)
[place] VALVE TEAM  Patient Name: Sonya Carpenter Date of Encounter: 03/15/2017  Primary Cardiologist: Dr. Alroy Dust [facility] [place]) / Dr. Burt Knack & Dr. Roxy Manns (TAVR)  Hospital Problem List     Principal Problem:   AKI (acute kidney injury) [facility]) Active Problems:   Severe aortic stenosis   CKD (chronic kidney disease) stage 4, GFR 15-29 ml/min ([place])   Hypertension   Hyperlipidemia   Type 2 diabetes mellitus without complication, with long-term current use of insulin (HCC)   Anemia   Asthma     Subjective   No complaints. Does not speak only nods head. No chest pain or SOB. She has expected dental pain that is currently well controlled with pain medication.   Inpatient Medications    Scheduled Meds: . aminocaproic acid  10 mL Oral Q1H  . amLODipine  5 mg Oral Daily  . aspirin EC  81 mg Oral Daily  . atorvastatin  20 mg Oral q1800  . ferrous sulfate  325 mg Oral BID WC  . insulin aspart  0-15 Units Subcutaneous TID WC  . loratadine  10 mg Oral Daily  . mometasone-formoterol  2 puff Inhalation BID  . montelukast  10 mg Oral QHS  . sodium chloride flush  3 mL Intravenous Q12H   Continuous Infusions: . sodium chloride    . sodium chloride 75 mL/hr at 03/15/17 0521  . lactated ringers Stopped (03/14/17 1240)   PRN Meds: sodium chloride, acetaminophen, docusate sodium, HYDROcodone-acetaminophen, morphine injection, ondansetron (ZOFRAN) IV, polyvinyl alcohol, sodium chloride flush, white petrolatum   Vital Signs    Vitals:   03/14/17 2116 03/14/17 2358 03/15/17 0557 03/15/17 0700  BP: (!) 143/66 135/66 (!) 146/62   Pulse: (!) 107 98 82 84  Resp: 18 18  18   Temp: 99.1 F (37.3 C) 98.6 F (37 C) 97.6 F (36.4 C)   TempSrc: Axillary Oral Axillary   SpO2: 100% 97% 99% 96%  Weight:   101 lb 3.2 oz (45.9 kg)   Height:        Intake/Output Summary (Last 24 hours) at 03/15/2017 0942 Last data filed at 03/15/2017 1950 Gross per  24 hour  Intake 2736.25 ml  Output 2900 ml  Net -163.75 ml   Filed Weights   03/14/17 0628 03/14/17 1242 03/15/17 0557  Weight: 110 lb (49.9 kg) 104 lb 12.8 oz (47.5 kg) 101 lb 3.2 oz (45.9 kg)    Physical Exam   GEN: Well nourished, well developed, in no acute distress. Thin, white female with significant ecchymosis and swelling around oral cavity HEENT: Grossly normal.  Neck: Supple, no JVD, carotid bruits, or masses. Cardiac: RRR, 4/6 SEM @ RUSB. No rubs, or gallops. No clubbing, cyanosis, edema.  Radials/DP/PT 2+ and equal bilaterally.  Respiratory:  Respirations regular and unlabored, clear to auscultation bilaterally. GI: Soft, nontender, nondistended, BS + x 4. MS: no deformity or atrophy. Skin: warm and dry, no rash. Neuro:  Strength and sensation are intact. Psych: AAOx3.  Normal affect.  Labs    CBC Recent Labs    03/14/17 1516 03/15/17 0536  WBC 8.3 6.2  HGB 10.2* 9.6*  HCT 30.7* 30.0*  MCV 88.5 89.6  PLT 112* 932*   Basic Metabolic Panel Recent Labs    03/14/17 1516 03/15/17 0536  NA 138 141  K 5.4* 4.5  CL 110 113*  CO2 19* 18*  GLUCOSE 162* 108*  BUN 86* 76*  CREATININE 2.61* 2.45*  CALCIUM 9.7  9.3   Liver Function Tests No results for input(s): AST, ALT, ALKPHOS, BILITOT, PROT, ALBUMIN in the last 72 hours. No results for input(s): LIPASE, AMYLASE in the last 72 hours. Cardiac Enzymes No results for input(s): CKTOTAL, CKMB, CKMBINDEX, TROPONINI in the last 72 hours. BNP Invalid input(s): POCBNP D-Dimer No results for input(s): DDIMER in the last 72 hours. Hemoglobin A1C Recent Labs    03/14/17 0626  HGBA1C 8.0*   Fasting Lipid Panel No results for input(s): CHOL, HDL, LDLCALC, TRIG, CHOLHDL, LDLDIRECT in the last 72 hours. Thyroid Function Tests No results for input(s): TSH, T4TOTAL, T3FREE, THYROIDAB in the last 72 hours.  Invalid input(s): FREET3  Telemetry    NSR - Personally Reviewed  ECG    None this admission  -  Personally Reviewed  Radiology    No results found.  Cardiac Studies   None this admission   Patient Profile     Sonya Carpenter is a 71 y.o. 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 Pre operative labs revealed AKI and new anemia. She underwent successful dental extractions of her remaining 22 teeth and was admitted for observation and further work up.    Assessment & Plan    AKI: creat increased from 1.86 on 10/17 --> 2.72 yesterday. However, review of scanned lab reports from [facility] reveal that creat was 2.27 on 10/30. She may have a new baseline after CT scanning and cath. Her creat has improved to 2.45 with IVFs and holding lasix/ARB. Will stop IV fluids given critical AS to obviate precipitating acute CHF. Plan to continue to hold ARB at discharge  Anemia: Hg 8.5 on admission. It was 12.7 on 10/12 and 10.7 on 10/30 (scanned labs). She does have a history of anemia and takes PO iron at home. She has also received Faraheme infusions in the past and is due for one now. She has had some black stools at home but does take iron. We will try to obtain a FOBT while admitted.   HTN: BP with moderate control. I will increase amlodipine from 5mg  to 10mg  since we are discontinuing Losartan 50mg  daily.   Critical AS: she is tentatively scheduled for TAVR next Tuesday. She will be seen by pre admission testing on Friday with labs. Based on that lab work, we will decide if she needs to be admitted on Monday   DMT2: continue SSI   Dental extractions: she underwent extraction of tooth #'s 2-11, 15, 19-28 and 31 with alveoloplasty, bilateral mandibular tori reductions and bilateral mandibular lingual exostoses reductions. She has significant pain and swelling.  Continue IV pain meds for now. Nutrition consult pending; continue Glucerna. She will be sent home with PO pain medications.   Dispo: plan for DC home tomorrow.   Mable Fill, PA-C  03/15/2017, 9:42 AM  Pager [phone]  Patient seen, examined. Available data reviewed. Agree with findings, assessment, and plan as outlined by Nell Range, PA-C.  On my exam the patient is alert and oriented, in no distress.  There is significant facial swelling and ecchymoses after extensive dental extraction.  Lungs are clear.  Heart is regular rate and rhythm with a late peaking 4/6 harsh systolic murmur at the right upper sternal border.  No diastolic murmur.  Abdomen is soft nontender.  Extremities  show no edema.  All available data is reviewed.  The patient is hospitalized with acute kidney injury and anemia.  I suspect acute on chronic kidney injury is related to contrast studies and diuresis with low cardiac output in the setting of critical aortic stenosis.  Anemia is likely multifactorial.  Considering the severity of her aortic stenosis.  She really is not a candidate for endoscopic procedures at this point when thinking about working up potential causes of anemia.  She does not appear to be actively bleeding.  It seems most prudent to move forward with plans for TAVR next week as scheduled.  We will recheck her tomorrow.  Probable discharge home tomorrow.  [name], M.D. 03/15/2017 12:05 PM

## 2017-03-15 NOTE — Plan of Care (Signed)
  Progressing Coping: Level of anxiety will decrease 03/15/2017 1214 - Progressing by Imagene Gurney, RN Pain Managment: General experience of comfort will improve 03/15/2017 1214 - Progressing by Imagene Gurney, RN Safety: Ability to remain free from injury will improve 03/15/2017 1214 - Progressing by Imagene Gurney, RN

## 2017-03-15 NOTE — Progress Notes (Signed)
POST OPERATIVE NOTE:  03/15/2017 Sonya Carpenter [id number]  VITALS: BP (!) 146/62 (BP Location: Right Arm)   Pulse 84   Temp 97.6 F (36.4 C) (Axillary)   Resp 18   Ht 4\' 10"  (1.473 m)   Wt 101 lb 3.2 oz (45.9 kg)   SpO2 96%   BMI 21.15 kg/m   LABS:  Lab Results  Component Value Date   WBC 6.2 03/15/2017   HGB 9.6 (L) 03/15/2017   HCT 30.0 (L) 03/15/2017   MCV 89.6 03/15/2017   PLT 112 (L) 03/15/2017   BMET    Component Value Date/Time   NA 141 03/15/2017 0536   NA 142 02/04/2017 1226   K 4.5 03/15/2017 0536   CL 113 (H) 03/15/2017 0536   CO2 18 (L) 03/15/2017 0536   GLUCOSE 108 (H) 03/15/2017 0536   BUN 76 (H) 03/15/2017 0536   BUN 51 (H) 02/04/2017 1226   CREATININE 2.45 (H) 03/15/2017 0536   CALCIUM 9.3 03/15/2017 0536   GFRNONAA 19 (L) 03/15/2017 0536   GFRAA 22 (L) 03/15/2017 0536    Lab Results  Component Value Date   INR 1.23 03/14/2017   INR 1.1 02/04/2017   No results found for: PTT   Sonya Carpenter is status post extraction of remaining teeth with alveoloplasty and preprosthetic surgery in the operating room with general anesthesia on 03/14/2017.  SUBJECTIVE: Patient is still sore from dental extractions.  Patient has had some persistent oozing from lower extractions sites.  EXAM: The patient has significant intraoral and extraoral swelling and bruising consistent with surgical procedures in the operating room yesterday.  Patient has some residual oozing from dental extraction sites on the lower arch.  Clots are present.  Sutures are intact.  The patient is now edentulous.  There is no sign of infection.   ASSESSMENT: Post operative course is consistent with dental procedures performed in the OR with GA. Loss of teeth due to extractions Patient is now edentulous. Atrophy of alveolar rdiges  PLAN: 1.  Patient is to use the Amicar rinses to assisted maintenance of hemostasis.  Patient has drains with 10 mL's every hour for the next 10 hours  and a swish and spit manner.   2.  Advance diet as tolerated to a soft diet.  Awaiting nutritional consultation.  Encourage nutritional supplements. 3.  Sutures are to resolve on their own in 7-10 days.  Patient to call for follow-up evaluation of healing and removal of residual sutures after the heart valve surgery.   4.  Patient may benefit from an additional day of  hospitalization and should be ready for discharge on Wednesday morning from a dental standpoint.  Ultimate decision on discharge is to be made by medical team.      Lenn Cal, DDS

## 2017-03-16 DIAGNOSIS — K0820 Unspecified atrophy of edentulous alveolar ridge: Secondary | ICD-10-CM

## 2017-03-16 DIAGNOSIS — K08199 Complete loss of teeth due to other specified cause, unspecified class: Secondary | ICD-10-CM

## 2017-03-16 DIAGNOSIS — I350 Nonrheumatic aortic (valve) stenosis: Secondary | ICD-10-CM

## 2017-03-16 DIAGNOSIS — Z01818 Encounter for other preprocedural examination: Secondary | ICD-10-CM

## 2017-03-16 DIAGNOSIS — N179 Acute kidney failure, unspecified: Secondary | ICD-10-CM | POA: Diagnosis not present

## 2017-03-16 DIAGNOSIS — K029 Dental caries, unspecified: Secondary | ICD-10-CM | POA: Diagnosis not present

## 2017-03-16 DIAGNOSIS — K08109 Complete loss of teeth, unspecified cause, unspecified class: Secondary | ICD-10-CM

## 2017-03-16 LAB — BASIC METABOLIC PANEL
Anion gap: 9 (ref 5–15)
BUN: 78 mg/dL — AB (ref 6–20)
CHLORIDE: 108 mmol/L (ref 101–111)
CO2: 23 mmol/L (ref 22–32)
Calcium: 9.7 mg/dL (ref 8.9–10.3)
Creatinine, Ser: 2.45 mg/dL — ABNORMAL HIGH (ref 0.44–1.00)
GFR calc non Af Amer: 19 mL/min — ABNORMAL LOW (ref >60)
GFR, EST AFRICAN AMERICAN: 22 mL/min — AB (ref >60)
GLUCOSE: 351 mg/dL — AB (ref 65–99)
POTASSIUM: 4.4 mmol/L (ref 3.5–5.1)
SODIUM: 140 mmol/L (ref 135–145)

## 2017-03-16 LAB — GLUCOSE, CAPILLARY
Glucose-Capillary: 314 mg/dL — ABNORMAL HIGH (ref 65–99)
Glucose-Capillary: 284 mg/dL — ABNORMAL HIGH (ref 65–99)

## 2017-03-16 LAB — CBC
HCT: 29.2 % — ABNORMAL LOW (ref 36.0–46.0)
Hemoglobin: 9.4 g/dL — ABNORMAL LOW (ref 12.0–15.0)
MCH: 29.7 pg (ref 26.0–34.0)
MCHC: 32.2 g/dL (ref 30.0–36.0)
MCV: 92.1 fL (ref 78.0–100.0)
Platelets: 116 10*3/uL — ABNORMAL LOW (ref 150–400)
RBC: 3.17 MIL/uL — ABNORMAL LOW (ref 3.87–5.11)
RDW: 15.4 % (ref 11.5–15.5)
WBC: 5.1 10*3/uL (ref 4.0–10.5)

## 2017-03-16 MED ORDER — HYDROCODONE-ACETAMINOPHEN 5-325 MG PO TABS: 1.0000 | ORAL_TABLET | Freq: Four times a day (QID) | ORAL | 0 refills | Status: DC | PRN

## 2017-03-16 MED ORDER — AMLODIPINE BESYLATE 10 MG PO TABS: 10.0000 mg | ORAL_TABLET | Freq: Every day | ORAL | 6 refills | Status: DC

## 2017-03-16 MED ORDER — CHLORHEXIDINE GLUCONATE 0.12 % MT SOLN: 15.0000 mL | Freq: Two times a day (BID) | OROMUCOSAL | 99 refills | Status: DC

## 2017-03-16 MED ORDER — PREMIER PROTEIN SHAKE: 11.0000 [oz_av] | Freq: Three times a day (TID) | ORAL | 0 refills | Status: DC

## 2017-03-16 NOTE — Progress Notes (Signed)
PROGRESS NOTE: POST OP NOTE DAY #2  03/16/2017 Feliz Beam [id number]  VITALS: BP 138/64 (BP Location: Right Arm)   Pulse 94   Temp 98.5 F (36.9 C) (Oral)   Resp 18   Ht 4\' 10"  (1.473 m)   Wt 102 lb 6.4 oz (46.4 kg)   SpO2 95%   BMI 21.40 kg/m   LABS:  Lab Results  Component Value Date   WBC 5.1 03/16/2017   HGB 9.4 (L) 03/16/2017   HCT 29.2 (L) 03/16/2017   MCV 92.1 03/16/2017   PLT 116 (L) 03/16/2017   BMET    Component Value Date/Time   NA 140 03/16/2017 0507   NA 142 02/04/2017 1226   K 4.4 03/16/2017 0507   CL 108 03/16/2017 0507   CO2 23 03/16/2017 0507   GLUCOSE 351 (H) 03/16/2017 0507   BUN 78 (H) 03/16/2017 0507   BUN 51 (H) 02/04/2017 1226   CREATININE 2.45 (H) 03/16/2017 0507   CALCIUM 9.7 03/16/2017 0507   GFRNONAA 19 (L) 03/16/2017 0507   GFRAA 22 (L) 03/16/2017 0507    Lab Results  Component Value Date   INR 1.23 03/14/2017   INR 1.1 02/04/2017   No results found for: PTT   Brielynn Schriver is status post extraction of remaining teeth with alveoloplasty and pre-prosthetic surgery on 03/15/17.  SUBJECTIVE: Patient feels "much better". Denies active bleeding. Patient is eating well this morning by report.  EXAM: No sign of persistent bleeding.  Sutures are intact.  Clots are present.  Patient still has significant intraoral and extraoral bruising and swelling.    ASSESSMENT: Post operative course is consistent with dental procedures performed in the operating room. Loss of teeth due to extraction. Completely edentulous. There is atrophy of the edentulous alveolar ridges. Intraoral and extraoral swelling and bruising consistent with surgery.  PLAN: 1.  Use chlorhexidine rinses twice daily after breakfast and at bedtime. 2.  Use salt water rinses every 2 hours in between the chlorhexidine rinses. 3.  Brush tongue daily. 4.  Advance diet as tolerated to a soft diet only as per dietary instructions.  Use Glucerna nutritional supplements  as instructed. 5.  Follow-up with Dr. Roxy Manns and Dr. Burt Knack for anticipated TAVR procedure early next week as scheduled.  Patient is cleared for the heart valve surgery at this time. 6.  Follow-up with Dental Medicine once discharged from the anticipated heart valve surgery next week.  Patient will eventually follow up with a dentist of her choice for fabrication of upper and lower complete dentures once medically stable from the anticipated heart valve surgery. 7.  Call Dental Medicine if problems arise with healing.   Lenn Cal, DDS

## 2017-03-16 NOTE — Progress Notes (Signed)
Inpatient Diabetes Program Recommendations  AACE/ADA: New Consensus Statement on Inpatient Glycemic Control (2015)  Target Ranges:  Prepandial:   less than 140 mg/dL      Peak postprandial:   less than 180 mg/dL (1-2 hours)      Critically ill patients:  140 - 180 mg/dL  Results for AMESHIA, PEWITT (MRN [id number]) as of 03/16/2017 09:27  Ref. Range 03/15/2017 08:06 03/15/2017 11:57 03/15/2017 17:26 03/15/2017 20:52 03/16/2017 07:31  Glucose-Capillary Latest Ref Range: 65 - 99 mg/dL 98 272 (H) 137 (H) 284 (H) 314 (H)    Review of Glycemic Control  Diabetes history: DM2 Outpatient Diabetes medications: Lantus 30 units QHS Current orders for Inpatient glycemic control: Novolog 0-15 units TID with meals  Inpatient Diabetes Program Recommendations: Insulin - Basal: Please consider ordering Lantus 5 units daily. Correction (SSI): Please consider ordering Novolog 0-5 units QHS for bedtime correction scale.  Thanks, Barnie Alderman, RN, MSN, CDE Diabetes Coordinator Inpatient Diabetes Program [phone] (Team Pager from 8am to 5pm)

## 2017-03-16 NOTE — Discharge Summary (Signed)
[place] VALVE TEAM   Discharge Summary    Patient ID: Sonya Carpenter,  MRN: [id number], DOB/AGE: 28-Dec-1945 71 y.o.  Admit date: 03/14/2017 Discharge date: 03/16/2017  Primary Care Provider: Michell Heinrich Primary Cardiologist: Dr.Zachary Angelina Carpenter, [place]) / Dr. Burt Carpenter & Dr. 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 Pre operative labs revealed AKI and new anemia. She underwent successful dental extractions of her remaining 22 teeth and was admitted for observation and further work up.  Sonya Carpenter has known of the presence of a heart murmur for several years. She was seen approximately 3 years ago by Dr. Rosalita Carpenter and echocardiogram reportedly revealed findings consistent with moderate aortic stenosis.Patient was lost to follow-up for a period of time primarily because her husband was in poor health. 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 During that hospitalization she was thought to have a possible abdominal mass with biliary ductal dilitation. She was referred to [facility] in  [place] where she was diagnosed withcholedochocholelithiasisby ERCP.She did not have an abdominal mass but she was noted to have some gallstones with intermittent episodes of right upper quadrant abdominal discomfort brought on with large meals. Elective cholecystectomy was planned but preoperative cardiac clearance requested due to the presence of her aortic stenosis. She was subsequently readmitted to the hospital in [place] in September with shortness of breath associated with large right pleural effusion requiring thoracentesis. She was treated for possible pneumonia. Transthoracic echocardiogram revealed severe aortic stenosis with peak and mean transvalvular gradients reported 121 and 69 mmHg, respectively.Left ventricular systolic function remains normal with ejection fraction estimated 65%. She was referred to Dr. Burt Carpenter for consultation and underwent left and right heart catheterization by Dr. Angelena Carpenter on February 09, 2017. 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 Peak to peak and mean transvalvular gradients across the aortic valve were measured 35 and 18.2 mmHg corresponding to aortic valve area calculated 1.26 cm squared.  She presented to [facility] on 03/14/17 for planned dental extractions prior to TAVR with Dr. Enrique Carpenter. Pre op labs that morning showed AKI with creat up from 1.86--> 2.72 and new anemia with Hg dropping from 12.7--> 8.5 from 1 month ago. She underwent successful dental extraction with an EBL of ~100 ccs. She was transfused 1 U PRBCs intraoperatively. Given laboratory abnormalities and extensive dental extractions she was admitted for observation.    Hospital Course     Consultants: none  AKI: creat increased from 1.86 on 10/17 --> 2.72. However, review of scanned lab reports from [place] reveal that creat was 2.27 on 10/30. We suspect acute on chronic kidney  injury is related to contrast studies (CT and cath) and diuresis with low cardiac output in the setting of critical aortic stenosis. Her creat has improved to 2.45 with IVFs and holding lasix/ARB. Plan to hold lasix/ARB at discharge. She will return Friday for preop admission labs.   Anemia: Hg 8.5 on admission. She received 1 U PRBCs intraoperatively. Her Hg has remained stable since that time, 9.4 today. She does have a history of anemia and takes PO iron at home. FOBT negative during her admission. Anemia felt to be multifactorial. She has had an extensive GI work up at [facility] and is pending cholecystectomy sometime after TAVR.   HTN: BP better controlled on increased Amlodipine (5mg --> 10mg  daily). As above, we will continue to hold her Losartan 50mg  daily and Lasix 40mg  daily given worsening renal function.   Critical AS: she is tentatively scheduled for TAVR next Tuesday. She will be seen by pre admission testing on Friday with labs.  DMT2: she was treated with SSI while admitted. Continue home regimen at discharge.   Dental extractions: she underwent extraction of tooth #'s 2-11, 15, 19-28 and 31 with alveoloplasty, bilateral mandibular tori reductions and bilateral mandibular lingual exostoses reductions. She has significant pain and swelling. She was treated with IV pain medication. Will discharge her on Vicodin 5/325 Take 1-2 po Q 6 h for mod-severe pain.   The patient has had an uncomplicated hospital course and is recovering well. She has been seen by Dr. Burt Carpenter today and deemed ready for discharge home. All follow-up appointments have been scheduled. Discharge medications are listed below.  _____________  Discharge Vitals Blood pressure 138/64, pulse 94, temperature 98.5 F (36.9 C), temperature source Oral, resp. rate 18, height 4\' 10"  (1.473 m), weight 102 lb 6.4 oz (46.4 kg), SpO2 95 %.  Filed Weights   03/14/17 1242 03/15/17 0557 03/16/17 0434  Weight: 104 lb 12.8 oz  (47.5 kg) 101 lb 3.2 oz (45.9 kg) 102 lb 6.4 oz (46.4 kg)   GEN: Well nourished, well developed, in no acute distress. Thin, white female with significant ecchymosis and swelling around oral cavity HEENT: Grossly normal.  Neck: Supple, no JVD, carotid bruits, or masses. Cardiac: RRR, 4/6 SEM @ RUSB. No rubs, or gallops. No clubbing, cyanosis, edema.  Radials/DP/PT 2+ and equal bilaterally.  Respiratory:  Respirations regular and unlabored, clear to auscultation bilaterally. GI: Soft, nontender, nondistended, BS + x 4. MS: no deformity or atrophy. Skin: warm and dry, no rash. Neuro:  Strength and sensation are intact. Psych: AAOx3.  Normal affect.   Labs & Radiologic Studies     CBC Recent Labs    03/15/17 0536 03/16/17 0507  WBC 6.2 5.1  HGB 9.6* 9.4*  HCT 30.0* 29.2*  MCV 89.6 92.1  PLT 112* 053*   Basic Metabolic Panel Recent Labs    03/15/17 0536 03/16/17 0507  NA 141 140  K 4.5 4.4  CL 113* 108  CO2 18* 23  GLUCOSE 108* 351*  BUN 76* 78*  CREATININE 2.45* 2.45*  CALCIUM 9.3 9.7   Liver Function Tests No results for input(s): AST, ALT, ALKPHOS, BILITOT, PROT, ALBUMIN in the last 72 hours. No results for input(s): LIPASE, AMYLASE in the last 72 hours. Cardiac Enzymes No results for input(s): CKTOTAL, CKMB, CKMBINDEX, TROPONINI in the last 72 hours. BNP Invalid input(s): POCBNP D-Dimer No results for input(s): DDIMER in the last 72 hours. Hemoglobin A1C Recent Labs    03/14/17 0626  HGBA1C 8.0*   Fasting  Lipid Panel No results for input(s): CHOL, HDL, LDLCALC, TRIG, CHOLHDL, LDLDIRECT in the last 72 hours. Thyroid Function Tests No results for input(s): TSH, T4TOTAL, T3FREE, THYROIDAB in the last 72 hours.  Invalid input(s): FREET3  Ct Coronary Morph W/cta Cor W/score W/ca W/cm &/or Wo/cm  Addendum Date: 03/01/2017   ADDENDUM REPORT: 03/01/2017 15:02 EXAM: OVER-READ INTERPRETATION  CT CHEST The following report is an over-read performed by  radiologist Dr. Samara Snide [facility] on 03/01/2017. This over-read does not include interpretation of cardiac or coronary anatomy or pathology. The coronary CTA interpretation by the cardiologist is attached. COMPARISON:  None. FINDINGS: Please see the separate report for the concurrent dedicated chest CT angiogram for further details regarding significant extracardiac findings in the chest. No acute consolidative airspace disease, lung masses or significant pulmonary nodules. No pneumothorax or pleural effusions. Mild goiter. Right adrenal 1.5 cm nodule with density 40 HU (series 9/image 70). No suspicious focal osseous lesions. IMPRESSION: Please see the separate report for the concurrent dedicated chest CT angiogram for details regarding significant extracardiac findings in the chest. Electronically Signed   By: Ilona Sorrel M.D.   On: 03/01/2017 15:02   Result Date: 03/01/2017 CLINICAL DATA:  Aortic Stenosis EXAM: Cardiac TAVR CT MEDICATIONS: None TECHNIQUE: The patient was scanned on a Siemens Force 401 slice scanner. A 120 kV retrospective scan was triggered in the ascending thoracic aorta at 140 HU's. Gantry rotation speed was 250 msecs and collimation was .6 mm. No beta blockade or nitro were given. The 3D data set was reconstructed in 5% intervals of the R-R cycle. Systolic and diastolic phases were analyzed on a dedicated work station using MPR, MIP and VRT modes. The patient received 80 cc of contrast. The patient was scanned on a Siemens Force 027 slice scanner. Gantry rotation speed was 250 msecs. Collimation was .6 mm. A 100 kV prospective scan was triggered in the ascending thoracic aorta at 140 HU's Full mA was used between 35% and 75% of the R-R interval. Average HR during the scan was 80 bpm. The 3D data set was interpreted on a dedicated work station using MPR, MIP and VRT modes. A total of 80cc of contrast was used for the entire study including the chest CTA and run off.  Patient had bicarbonate drip prior to procedure for CRF. FINDINGS: Aortic Valve: Tri leaflet and moderately calcified with restricted leaflet motion. Aorta: Moderate atherosclerotic disease with normal arch vessel origin Sino-tubular Junction:  24 mm Ascending Thoracic Aorta:  30 mm Aortic Arch:  23 mm Descending Thoracic Aorta:  21 mm Sinus of Valsalva Measurements: Non-coronary:  31 mm Right - coronary:  30 mm Left -   coronary:  30 mm Coronary Artery Height above Annulus: Left Main:  11.5 mm above annulus Right Coronary:  16.4 mm above annulus Virtual Basal Annulus Measurements: Maximum / Minimum Diameter:  26.6 mm x 19.2 mm elliptical Perimeter:  72.7 mm Area:  296 mm2 Coronary Arteries:  Sufficient height above annulus for deployment Optimum Fluoroscopic Angle for Delivery: LAO 17 degrees Caudal 9 degrees IMPRESSION: 1) Calcified tri leaflet aortic valve with annular area of 396 mm2 suitable for a 23 mm Sapien 3 valve and perimeter of 72.7 mm suitable for a 26 mm Evolut Pro valve 2) Optimum angiographic angle for deployment LAO 17 degrees Caudal 9 degrees 3) Coronary arteries sufficient height above annulus for deployment 4) Normal aortic root and arch vessels with moderate calcific atherosclerosis 5) Significant nodular calcium at base  of left annular sinus extending into the intervalvular fibrosa. Also appears to be significant thickening and restriction to motion of mitral valve suggest echo correlation 6) Moderate LAE with no LAA thrombus Jenkins Rouge Electronically Signed: By: Jenkins Rouge M.D. On: 03/01/2017 14:57   Ct Angio Chest Aorta W &/or Wo Contrast  Result Date: 03/01/2017 CLINICAL DATA:  Severe aortic stenosis.  TAVR evaluation. EXAM: CT ANGIOGRAPHY CHEST, ABDOMEN AND PELVIS TECHNIQUE: Multidetector CT imaging through the chest, abdomen and pelvis was performed using the standard protocol during bolus administration of intravenous contrast. Multiplanar reconstructed images and MIPs were  obtained and reviewed to evaluate the vascular anatomy. CONTRAST:  80 cc Isovue 370 IV. COMPARISON:  None. FINDINGS: CTA CHEST FINDINGS Cardiovascular: Mild cardiomegaly. There is diffuse thickening and coarse calcification of the aortic valve. No significant pericardial fluid/thickening. Left main, left anterior descending, left circumflex and right coronary atherosclerosis . Atherosclerotic nonaneurysmal thoracic aorta. No evidence of acute intramural hematoma, dissection, pseudoaneurysm or penetrating atherosclerotic ulcer. Aortic arch branch vessels are patent. Top-normal caliber main pulmonary artery (3.0 cm diameter). No central pulmonary emboli. Mediastinum/Nodes: Multinodular goiter with several hypodense nodules throughout the bilateral thyroid lobes, largest 1.1 cm in the upper right thyroid lobe. Mildly patulous thoracic esophagus. Small fluid level in the lower thoracic esophagus. No pathologically enlarged axillary, mediastinal or hilar lymph nodes. Lungs/Pleura: No pneumothorax. No pleural effusion. No acute consolidative airspace disease, lung masses or significant pulmonary nodules. Nonspecific diffuse bronchial wall thickening. Musculoskeletal: No aggressive appearing focal osseous lesions. Moderate thoracic spondylosis. CTA ABDOMEN AND PELVIS FINDINGS Hepatobiliary: Normal liver size. Nonspecific heterogeneous liver parenchymal enhancement with numerous subcentimeter nodular foci of hyperenhancement throughout the liver, probably representing transient hepatic attenuation differences due to the early phase of contrast. No definite liver masses. Normal gallbladder with no radiopaque cholelithiasis. There is pneumobilia in the left central bile ducts. No intrahepatic biliary ductal dilatation. Dilated common bile duct (11 mm diameter) with smooth distal tapering. No radiopaque choledocholithiasis. Pancreas: No pancreatic mass.  No pancreatic duct dilation. Spleen: Normal size. No mass.  Adrenals/Urinary Tract: Right adrenal 1.5 cm nodule with density 103 HU (series 14/ image 81). No left adrenal nodules. No hydronephrosis. Small simple left renal cysts, largest 1.5 cm. Additional subcentimeter hypodense renal cortical lesions in both kidneys, too small to characterize, requiring no follow-up. Normal bladder. Stomach/Bowel: Grossly normal stomach. Normal caliber small bowel with no small bowel wall thickening. Normal appendix. Mild scattered colonic diverticulosis, with no large bowel wall thickening or pericolonic fat stranding. Vascular/Lymphatic: Atherosclerotic nonaneurysmal abdominal aorta. Patent portal, splenic and renal veins. No pathologically enlarged lymph nodes in the abdomen or pelvis. Reproductive: Mildly enlarged myomatous uterus with coarsely calcified posterior uterine 4.8 cm subserosal fibroid. No adnexal masses. Other: No pneumoperitoneum, ascites or focal fluid collection. Musculoskeletal: No aggressive appearing focal osseous lesions. Mild lumbar spondylosis. Anterolisthesis at L4-5 measures 8 mm. VASCULAR MEASUREMENTS PERTINENT TO TAVR: AORTA: Minimal Aortic Diameter -  13 x 13 mm (infrarenal abdominal aorta) Severity of Aortic Calcification -  moderate RIGHT PELVIS: Right Common Iliac Artery - Minimal Diameter - 7.2 x 5.1 mm Tortuosity - mild-to-moderate Calcification - moderate There is a moderate focal atherosclerotic stenosis in the right common iliac artery approximately 2 cm below the aortic bifurcation. Right External Iliac Artery - Minimal Diameter - 6.1 x 5.9 mm Tortuosity - mild Calcification - mild Right Common Femoral Artery - Minimal Diameter - 6.0 x 5.5 mm Tortuosity - mild Calcification - moderate to severe LEFT PELVIS: Left Common Iliac  Artery - Minimal Diameter - 8.1 x 7.2 mm Tortuosity - moderate Calcification - moderate Left External Iliac Artery - Minimal Diameter - 6.0 x 5.9 mm Tortuosity - mild Calcification - mild Left Common Femoral Artery - Minimal  Diameter - 5.3 x 3.8 mm Tortuosity - mild Calcification - moderate to severe There is early bifurcation of the left common femoral artery. Review of the MIP images confirms the above findings. IMPRESSION: 1. Vascular findings and measurements pertinent to potential TAVR procedure, as detailed above. 2. Severe thickening and calcification of the aortic valve, compatible with the reported clinical history of severe aortic stenosis. 3. Aortic Atherosclerosis (ICD10-I70.0). Left main and 3 vessel coronary atherosclerosis. Mild cardiomegaly. 4. 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 Follow-up adrenal protocol CT abdomen without and with IV contrast could be considered in 12 months. This recommendation follows ACR consensus guidelines: Management of Incidental Adrenal Masses: A White Paper of the ACR Incidental Findings Committee. J Am Coll Radiol 2017;14:1038-1044. 5. Pneumobilia, correlate for any history of sphincterotomy. Mild common bile duct dilation (11 mm diameter) without intrahepatic dilation. No radiopaque cholelithiasis or choledocholithiasis. Recommend correlation with serum bilirubin levels. MRI abdomen with MRCP without and with IV contrast could be considered for further evaluation as clinically warranted. 6. Additional chronic findings as detailed. Electronically Signed   By: Ilona Sorrel M.D.   On: 03/01/2017 14:58   Ct Angio Abd/pel W/ And/or W/o  Result Date: 03/01/2017 CLINICAL DATA:  Severe aortic stenosis.  TAVR evaluation. EXAM: CT ANGIOGRAPHY CHEST, ABDOMEN AND PELVIS TECHNIQUE: Multidetector CT imaging through the chest, abdomen and pelvis was performed using the standard protocol during bolus administration of intravenous contrast. Multiplanar reconstructed images and MIPs were obtained and reviewed to evaluate the vascular anatomy. CONTRAST:  80 cc Isovue 370 IV. COMPARISON:  None. FINDINGS: CTA CHEST FINDINGS Cardiovascular: Mild cardiomegaly.  There is diffuse thickening and coarse calcification of the aortic valve. No significant pericardial fluid/thickening. Left main, left anterior descending, left circumflex and right coronary atherosclerosis . Atherosclerotic nonaneurysmal thoracic aorta. No evidence of acute intramural hematoma, dissection, pseudoaneurysm or penetrating atherosclerotic ulcer. Aortic arch branch vessels are patent. Top-normal caliber main pulmonary artery (3.0 cm diameter). No central pulmonary emboli. Mediastinum/Nodes: Multinodular goiter with several hypodense nodules throughout the bilateral thyroid lobes, largest 1.1 cm in the upper right thyroid lobe. Mildly patulous thoracic esophagus. Small fluid level in the lower thoracic esophagus. No pathologically enlarged axillary, mediastinal or hilar lymph nodes. Lungs/Pleura: No pneumothorax. No pleural effusion. No acute consolidative airspace disease, lung masses or significant pulmonary nodules. Nonspecific diffuse bronchial wall thickening. Musculoskeletal: No aggressive appearing focal osseous lesions. Moderate thoracic spondylosis. CTA ABDOMEN AND PELVIS FINDINGS Hepatobiliary: Normal liver size. Nonspecific heterogeneous liver parenchymal enhancement with numerous subcentimeter nodular foci of hyperenhancement throughout the liver, probably representing transient hepatic attenuation differences due to the early phase of contrast. No definite liver masses. Normal gallbladder with no radiopaque cholelithiasis. There is pneumobilia in the left central bile ducts. No intrahepatic biliary ductal dilatation. Dilated common bile duct (11 mm diameter) with smooth distal tapering. No radiopaque choledocholithiasis. Pancreas: No pancreatic mass.  No pancreatic duct dilation. Spleen: Normal size. No mass. Adrenals/Urinary Tract: Right adrenal 1.5 cm nodule with density 103 HU (series 14/ image 81). No left adrenal nodules. No hydronephrosis. Small simple left renal cysts, largest 1.5 cm.  Additional subcentimeter hypodense renal cortical lesions in both kidneys, too small to characterize, requiring no follow-up. Normal bladder. Stomach/Bowel: Grossly normal stomach. Normal  caliber small bowel with no small bowel wall thickening. Normal appendix. Mild scattered colonic diverticulosis, with no large bowel wall thickening or pericolonic fat stranding. Vascular/Lymphatic: Atherosclerotic nonaneurysmal abdominal aorta. Patent portal, splenic and renal veins. No pathologically enlarged lymph nodes in the abdomen or pelvis. Reproductive: Mildly enlarged myomatous uterus with coarsely calcified posterior uterine 4.8 cm subserosal fibroid. No adnexal masses. Other: No pneumoperitoneum, ascites or focal fluid collection. Musculoskeletal: No aggressive appearing focal osseous lesions. Mild lumbar spondylosis. Anterolisthesis at L4-5 measures 8 mm. VASCULAR MEASUREMENTS PERTINENT TO TAVR: AORTA: Minimal Aortic Diameter -  13 x 13 mm (infrarenal abdominal aorta) Severity of Aortic Calcification -  moderate RIGHT PELVIS: Right Common Iliac Artery - Minimal Diameter - 7.2 x 5.1 mm Tortuosity - mild-to-moderate Calcification - moderate There is a moderate focal atherosclerotic stenosis in the right common iliac artery approximately 2 cm below the aortic bifurcation. Right External Iliac Artery - Minimal Diameter - 6.1 x 5.9 mm Tortuosity - mild Calcification - mild Right Common Femoral Artery - Minimal Diameter - 6.0 x 5.5 mm Tortuosity - mild Calcification - moderate to severe LEFT PELVIS: Left Common Iliac Artery - Minimal Diameter - 8.1 x 7.2 mm Tortuosity - moderate Calcification - moderate Left External Iliac Artery - Minimal Diameter - 6.0 x 5.9 mm Tortuosity - mild Calcification - mild Left Common Femoral Artery - Minimal Diameter - 5.3 x 3.8 mm Tortuosity - mild Calcification - moderate to severe There is early bifurcation of the left common femoral artery. Review of the MIP images confirms the above  findings. IMPRESSION: 1. Vascular findings and measurements pertinent to potential TAVR procedure, as detailed above. 2. Severe thickening and calcification of the aortic valve, compatible with the reported clinical history of severe aortic stenosis. 3. Aortic Atherosclerosis (ICD10-I70.0). Left main and 3 vessel coronary atherosclerosis. Mild cardiomegaly. 4. 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 Follow-up adrenal protocol CT abdomen without and with IV contrast could be considered in 12 months. This recommendation follows ACR consensus guidelines: Management of Incidental Adrenal Masses: A White Paper of the ACR Incidental Findings Committee. J Am Coll Radiol 2017;14:1038-1044. 5. Pneumobilia, correlate for any history of sphincterotomy. Mild common bile duct dilation (11 mm diameter) without intrahepatic dilation. No radiopaque cholelithiasis or choledocholithiasis. Recommend correlation with serum bilirubin levels. MRI abdomen with MRCP without and with IV contrast could be considered for further evaluation as clinically warranted. 6. Additional chronic findings as detailed. Electronically Signed   By: Ilona Sorrel M.D.   On: 03/01/2017 14:58     Diagnostic Studies/Procedures    03/14/17 OPERATIONS: 1. Multiple extraction of tooth numbers 2-11, 15, 19-28, and 31 2. 4 Quadrants of alveoloplasty 3.  Bilateral mandibular lingual tori reductions 4.  Bilateral mandibular lingual exostoses reductions  _____________    Disposition   Pt is being discharged home today in good condition.  Follow-up Plans & Appointments    Follow-up Information    Call Lenn Cal, DDS.   Specialty:  Dentistry Why:  For wound re-check and suture removal after heart valve surgery Contact information: [place] [address] [phone]        [facility]. Go on 03/18/2017.   Why:  @ 12:15pm for your pre admission  testing and lab work  Sport and exercise psychologist information: [place] [id number] [phone]         Discharge Instructions    Consult to dietitian   Complete by:  As directed    Patient is edentulous.   Nursing communication   Complete by:  As directed    Patient is to continue the Amicar 5% oral rinses postoperatively.  Patient is to rinse with 10 mL's every hour for the next 10 hours in a swish and spit manner.      Discharge Medications     Medication List    STOP taking these medications   furosemide 40 MG tablet Commonly known as:  LASIX   losartan 50 MG tablet Commonly known as:  COZAAR   potassium chloride 10 MEQ tablet Commonly known as:  K-DUR   traMADol 50 MG tablet Commonly known as:  ULTRAM     TAKE these medications   acetaminophen 500 MG tablet Commonly known as:  TYLENOL Take 500 mg by mouth every 6 (six) hours as needed for mild pain or moderate pain. Notes to patient:  Do not take Tylenol when also taking the Vicodin (because it already has a component of Tylenol)   albuterol 108 (90 Base) MCG/ACT inhaler Commonly known as:  PROVENTIL HFA;VENTOLIN HFA Inhale 2 puffs every 6 (six) hours as needed into the lungs for wheezing or shortness of breath.   albuterol (2.5 MG/3ML) 0.083% nebulizer solution Commonly known as:  PROVENTIL Take 2.5 mg 3 (three) times daily as needed by nebulization for wheezing or shortness of breath.   AMITIZA 8 MCG capsule Generic drug:  lubiprostone Take 8 mcg 2 (two) times daily as needed by mouth for constipation.   amLODipine 10 MG tablet Commonly known as:  NORVASC Take 1 tablet (10 mg total) by mouth daily. What changed:    medication strength  how much to take   ARTIFICIAL TEARS 0.1-0.3 % Soln Generic drug:  Dextran 70-Hypromellose Place 2 drops as needed into both eyes (for dry eyes).   aspirin EC 81 MG tablet Take 81 mg by mouth daily.   atorvastatin 20 MG tablet Commonly known  as:  LIPITOR Take 20 mg by mouth daily.   budesonide-formoterol 160-4.5 MCG/ACT inhaler Commonly known as:  SYMBICORT Inhale 2 puffs 2 (two) times daily as needed into the lungs (for shortness of breath).   cetirizine 10 MG tablet Commonly known as:  ZYRTEC Take 10 mg at bedtime by mouth.   chlorhexidine 0.12 % solution Commonly known as:  PERIDEX Use as directed 15 mLs in the mouth or throat 2 (two) times daily.   CLEAR EYES OP Place 2 drops 2 (two) times daily into both eyes.   docusate sodium 100 MG capsule Commonly known as:  COLACE Take 100 mg 2 (two) times daily as needed by mouth for mild constipation.   ferrous sulfate 325 (65 FE) MG EC tablet Take 325 mg by mouth 2 (two) times daily.   HYDROcodone-acetaminophen 5-325 MG tablet Commonly known as:  NORCO/VICODIN Take 1-2 tablets by mouth every 6 (six) hours as needed for moderate pain.   ipratropium 0.02 % nebulizer solution Commonly known as:  ATROVENT Take 0.5 mg by nebulization 2 (two) times daily.   LANTUS SOLOSTAR 100 UNIT/ML Solostar Pen Generic drug:  Insulin Glargine Inject 30 Units every morning into the skin.   montelukast 10 MG tablet Commonly known as:  SINGULAIR Take 10 mg by mouth at bedtime.   multivitamin with minerals tablet Take 1 tablet by mouth daily.   protein supplement shake Liqd Commonly known as:  PREMIER PROTEIN Take 325 mLs (11 oz total) by mouth 3 (three) times daily between meals.  sodium chloride 0.65 % Soln nasal spray Commonly known as:  OCEAN Place 2 sprays 2 (two) times daily as needed into both nostrils for congestion.         Outstanding Labs/Studies   Pre admission testing 03/18/17   Duration of Discharge Encounter   Greater than 30 minutes including physician time.  Signed, Sonya Form PA-C 03/16/2017, 9:37 AM  Patient seen, examined. Available data reviewed. Agree with findings, assessment, and plan as outlined by Nell Range, PA-C.  The  patient is independently interviewed and examined.  Facial ecchymoses and swelling are improved today.  Lungs are clear.  Heart is regular rate and rhythm with a grade 4/6 late peaking harsh systolic murmur at the right upper sternal border, extremities show no edema.  I have reviewed all available laboratory data.  Stool Hemoccult is negative.  Hemoglobin is stable.  Creatinine is stable.  Would hold losartan and furosemide at discharge.  Continued plans for TAVR next Tuesday.  The patient appears medically stable for hospital discharge today.  [name], M.D. 03/16/2017 11:41 AM

## 2017-03-16 NOTE — Pre-Procedure Instructions (Signed)
Sonya Carpenter  03/16/2017      [facility] [place], [place] [phone] [address] [place] [address] Phone: [phone] Fax: [phone]    Your procedure is scheduled on 03/22/17  Report to [facility] Admitting at 1100 A.M.  Call this number if you have problems the morning of surgery:  [phone]   Remember:  Do not eat food or drink liquids after midnight.  Take these medicines the morning of surgery with A SIP OF WATER     Inhalers(bring provental with you),symbicort,nebulizers if needed, amlodipine(norvasc) Hydrocodone or tramadol if needed  STOP all herbel meds, nsaids (aleve,naproxen,advil,ibuprofen) prior to surgery starting today 03/18/17 including all vitamins/supplements  Stop aspirin per dr   How to Manage Your Diabetes Before and After Surgery  Why is it important to control my blood sugar before and after surgery? . Improving blood sugar levels before and after surgery helps healing and can limit problems. . A way of improving blood sugar control is eating a healthy diet by: o  Eating less sugar and carbohydrates o  Increasing activity/exercise o  Talking with your doctor about reaching your blood sugar goals . High blood sugars (greater than 180 mg/dL) can raise your risk of infections and slow your recovery, so you will need to focus on controlling your diabetes during the weeks before surgery. . Make sure that the doctor who takes care of your diabetes knows about your planned surgery including the date and location.  How do I manage my blood sugar before surgery? . Check your blood sugar at least 4 times a day, starting 2 days before surgery, to make sure that the level is not too high or low. o Check your blood sugar the morning of your surgery when you wake up and every 2 hours until you get to the Short Stay unit. . If your blood sugar is less than 70 mg/dL, you will need to treat for low  blood sugar: o Do not take insulin. o Treat a low blood sugar (less than 70 mg/dL) with  cup of clear juice (cranberry or apple), 4 glucose tablets, OR glucose gel. Recheck blood sugar in 15 minutes after treatment (to make sure it is greater than 70 mg/dL). If your blood sugar is not greater than 70 mg/dL on recheck, call [phone] o  for further instructions. . Report your blood sugar to the short stay nurse when you get to Short Stay.  . If you are admitted to the hospital after surgery: o Your blood sugar will be checked by the staff and you will probably be given insulin after surgery (instead of oral diabetes medicines) to make sure you have good blood sugar levels. o The goal for blood sugar control after surgery is 80-180 mg/dL.  THE MORNING OF SURGERY, take 15 units of lantus insulin.   Do not wear jewelry, make-up or nail polish.  Do not wear lotions, powders, or perfumes, or deoderant.  Do not shave 48 hours prior to surgery.  Men may shave face and neck.  Do not bring valuables to the hospital.  [facility] is not responsible for any belongings or valuables.  Contacts, dentures or bridgework may not be worn into surgery.  Leave your suitcase in the car.  After surgery it may be brought to your room.  For patients admitted to the hospital, discharge time will be determined by your treatment team.  Patients discharged the day of surgery will not be allowed to drive home.   Special instructions:   Special Instructions: [facility] - Preparing for Surgery  Before surgery, you can play an important role.  Because skin is not sterile, your skin needs to be as free of germs as possible.  You can reduce the number of germs on you skin by washing with CHG (chlorahexidine gluconate) soap before surgery.  CHG is an antiseptic cleaner which kills germs and bonds with the skin to continue killing germs even after washing.  Please DO NOT use if you have an allergy to CHG or  antibacterial soaps.  If your skin becomes reddened/irritated stop using the CHG and inform your nurse when you arrive at Short Stay.  Do not shave (including legs and underarms) for at least 48 hours prior to the first CHG shower.  You may shave your face.  Please follow these instructions carefully:   1.  Shower with CHG Soap the night before surgery and the morning of Surgery.  2.  If you choose to wash your hair, wash your hair first as usual with your normal shampoo.  3.  After you shampoo, rinse your hair and body thoroughly to remove the Shampoo.  4.  Use CHG as you would any other liquid soap.  You can apply chg directly  to the skin and wash gently with scrungie or a clean washcloth.  5.  Apply the CHG Soap to your body ONLY FROM THE NECK DOWN.  Do not use on open wounds or open sores.  Avoid contact with your eyes ears, mouth and genitals (private parts).  Wash genitals (private parts)       with your normal soap.  6.  Wash thoroughly, paying special attention to the area where your surgery will be performed.  7.  Thoroughly rinse your body with warm water from the neck down.  8.  DO NOT shower/wash with your normal soap after using and rinsing off the CHG Soap.  9.  Pat yourself dry with a clean towel.            10.  Wear clean pajamas.            11.  Place clean sheets on your bed the night of your first shower and do not sleep with pets.  Day of Surgery  Do not apply any lotions/deodorants the morning of surgery.  Please wear clean clothes to the hospital/surgery center.  Please read over thefact sheets that you were given.

## 2017-03-17 LAB — TYPE AND SCREEN
ABO/RH(D): A POS
ANTIBODY SCREEN: NEGATIVE
UNIT DIVISION: 0
Unit division: 0

## 2017-03-17 LAB — BPAM RBC
BLOOD PRODUCT EXPIRATION DATE: 201812072359
Blood Product Expiration Date: 201812072359
ISSUE DATE / TIME: 201811190847
ISSUE DATE / TIME: 201811190847
Unit Type and Rh: 6200
Unit Type and Rh: 6200

## 2017-03-18 ENCOUNTER — Encounter (HOSPITAL_COMMUNITY)
Admission: RE | Admit: 2017-03-18 | Discharge: 2017-03-18 | Disposition: A | Discharge status: Home / Self Care | Payer: Medicare HMO | Source: Ambulatory Visit | Attending: Cardiovascular Disease | Admitting: Cardiovascular Disease

## 2017-03-18 ENCOUNTER — Ambulatory Visit (HOSPITAL_COMMUNITY)
Admission: RE | Admit: 2017-03-18 | Discharge: 2017-03-18 | Disposition: A | Discharge status: Home / Self Care | Payer: Medicare HMO | Source: Ambulatory Visit | Attending: Cardiovascular Disease | Admitting: Cardiovascular Disease

## 2017-03-18 ENCOUNTER — Other Ambulatory Visit: Payer: Self-pay

## 2017-03-18 ENCOUNTER — Encounter (HOSPITAL_COMMUNITY): Payer: Self-pay

## 2017-03-18 DIAGNOSIS — Z01812 Encounter for preprocedural laboratory examination: Secondary | ICD-10-CM | POA: Diagnosis not present

## 2017-03-18 DIAGNOSIS — I350 Nonrheumatic aortic (valve) stenosis: Secondary | ICD-10-CM | POA: Insufficient documentation

## 2017-03-18 DIAGNOSIS — Z01810 Encounter for preprocedural cardiovascular examination: Secondary | ICD-10-CM | POA: Insufficient documentation

## 2017-03-18 DIAGNOSIS — Z01818 Encounter for other preprocedural examination: Secondary | ICD-10-CM | POA: Diagnosis not present

## 2017-03-18 DIAGNOSIS — I517 Cardiomegaly: Secondary | ICD-10-CM | POA: Diagnosis not present

## 2017-03-18 LAB — COMPREHENSIVE METABOLIC PANEL
ALT: 11 U/L — ABNORMAL LOW (ref 14–54)
ANION GAP: 11 (ref 5–15)
AST: 24 U/L (ref 15–41)
Albumin: 3.3 g/dL — ABNORMAL LOW (ref 3.5–5.0)
Alkaline Phosphatase: 88 U/L (ref 38–126)
BUN: 83 mg/dL — ABNORMAL HIGH (ref 6–20)
CHLORIDE: 111 mmol/L (ref 101–111)
CO2: 18 mmol/L — AB (ref 22–32)
CREATININE: 2.39 mg/dL — AB (ref 0.44–1.00)
Calcium: 10 mg/dL (ref 8.9–10.3)
GFR, EST AFRICAN AMERICAN: 23 mL/min — AB (ref >60)
GFR, EST NON AFRICAN AMERICAN: 19 mL/min — AB (ref >60)
Glucose, Bld: 249 mg/dL — ABNORMAL HIGH (ref 65–99)
POTASSIUM: 3.6 mmol/L (ref 3.5–5.1)
SODIUM: 140 mmol/L (ref 135–145)
Total Bilirubin: 0.7 mg/dL (ref 0.3–1.2)
Total Protein: 6.8 g/dL (ref 6.5–8.1)

## 2017-03-18 LAB — URINALYSIS, ROUTINE W REFLEX MICROSCOPIC
Bilirubin Urine: NEGATIVE
KETONES UR: NEGATIVE mg/dL
Nitrite: NEGATIVE
PH: 5 (ref 5.0–8.0)
Protein, ur: NEGATIVE mg/dL
SQUAMOUS EPITHELIAL / LPF: NONE SEEN
Specific Gravity, Urine: 1.003 — ABNORMAL LOW (ref 1.005–1.030)

## 2017-03-18 LAB — GLUCOSE, CAPILLARY: GLUCOSE-CAPILLARY: 254 mg/dL — AB (ref 65–99)

## 2017-03-18 LAB — CBC
HCT: 29 % — ABNORMAL LOW (ref 36.0–46.0)
Hemoglobin: 9.4 g/dL — ABNORMAL LOW (ref 12.0–15.0)
MCH: 29.5 pg (ref 26.0–34.0)
MCHC: 32.4 g/dL (ref 30.0–36.0)
MCV: 90.9 fL (ref 78.0–100.0)
PLATELETS: 101 10*3/uL — AB (ref 150–400)
RBC: 3.19 MIL/uL — AB (ref 3.87–5.11)
RDW: 14.4 % (ref 11.5–15.5)
WBC: 5.1 10*3/uL (ref 4.0–10.5)

## 2017-03-18 LAB — BRAIN NATRIURETIC PEPTIDE: B Natriuretic Peptide: 182 pg/mL — ABNORMAL HIGH (ref 0.0–100.0)

## 2017-03-18 LAB — SURGICAL PCR SCREEN
MRSA, PCR: POSITIVE — AB
STAPHYLOCOCCUS AUREUS: POSITIVE — AB

## 2017-03-18 LAB — APTT: aPTT: 33 seconds (ref 24–36)

## 2017-03-18 LAB — PROTIME-INR
INR: 1.18
PROTHROMBIN TIME: 14.9 s (ref 11.4–15.2)

## 2017-03-18 NOTE — Progress Notes (Addendum)
PCP is Dr. Michell Heinrich- he also manages her DM Heart rate today 113-108 rechecked after she rested HR 98. Cardiologist is Dr. Alroy Dust from [place]. Nephrologist is Dr. Marlowe Sax from [place]. Reports her fasting CBG's run in the 150's Nell Range, PA-C into see pt. Instructed Toma Aran (cousin) to call about if and when to stop aspirin voices understanding. Message left with Lauren to return call. Bruises noted to face and neck from dental surgery done on Monday 03-14-17. Labs placed as stat. Spoke with blood bank about the T&S order. States to go ahead and draw it today and will need to re-draw on Day of surgery. Order placed for T&S on Tues 03-22-17 Card cath noted from 02-09-17 Echo noted from 03-07-17 PFT's noted form 03-07-17

## 2017-03-19 LAB — HEMOGLOBIN A1C
Hgb A1c MFr Bld: 7.4 % — ABNORMAL HIGH (ref 4.8–5.6)
Mean Plasma Glucose: 166 mg/dL

## 2017-03-21 MED ORDER — SODIUM CHLORIDE 0.9 % IV SOLN
INTRAVENOUS | Status: DC
  Filled 2017-03-21: qty 30

## 2017-03-21 MED ORDER — DEXMEDETOMIDINE HCL IN NACL 400 MCG/100ML IV SOLN
0.1000 ug/kg/h | INTRAVENOUS | Status: DC
  Filled 2017-03-21: qty 100

## 2017-03-21 MED ORDER — SODIUM CHLORIDE 0.9 % IV SOLN
INTRAVENOUS | Status: DC
  Filled 2017-03-21: qty 1

## 2017-03-21 MED ORDER — PHENYLEPHRINE HCL 10 MG/ML IJ SOLN
30.0000 ug/min | INTRAMUSCULAR | Status: DC
  Filled 2017-03-21: qty 2

## 2017-03-21 MED ORDER — SODIUM CHLORIDE 0.9 % IV SOLN
1250.0000 mg | INTRAVENOUS | Status: DC
  Administered 2017-03-29: 1000 mg via INTRAVENOUS
  Filled 2017-03-21: qty 1250

## 2017-03-21 MED ORDER — POTASSIUM CHLORIDE 2 MEQ/ML IV SOLN
80.0000 meq | INTRAVENOUS | Status: DC
  Filled 2017-03-21: qty 40

## 2017-03-21 MED ORDER — NITROGLYCERIN IN D5W 200-5 MCG/ML-% IV SOLN
2.0000 ug/min | INTRAVENOUS | Status: DC
  Filled 2017-03-21: qty 250

## 2017-03-21 MED ORDER — MAGNESIUM SULFATE 50 % IJ SOLN
40.0000 meq | INTRAMUSCULAR | Status: DC
  Filled 2017-03-21: qty 9.85

## 2017-03-21 MED ORDER — DOPAMINE-DEXTROSE 3.2-5 MG/ML-% IV SOLN
0.0000 ug/kg/min | INTRAVENOUS | Status: DC
  Filled 2017-03-21: qty 250

## 2017-03-21 MED ORDER — DEXTROSE 5 % IV SOLN
1.5000 g | INTRAVENOUS | Status: DC
  Administered 2017-03-29: 1.5 g via INTRAVENOUS
  Filled 2017-03-21: qty 1.5

## 2017-03-21 MED ORDER — EPINEPHRINE PF 1 MG/ML IJ SOLN
0.0000 ug/min | INTRAVENOUS | Status: DC
  Filled 2017-03-21: qty 4

## 2017-03-21 MED ORDER — NOREPINEPHRINE BITARTRATE 1 MG/ML IV SOLN
0.0000 ug/min | INTRAVENOUS | Status: DC
  Administered 2017-03-29: 2 ug/min via INTRAVENOUS
  Filled 2017-03-21: qty 4

## 2017-03-21 NOTE — Progress Notes (Signed)
Anesthesia Chart Review:  Pt is a 71 year old female scheduled for TAVR, transfemoral approach, possible left trans-carotid approach on 03/22/17 by [name], MD and Darylene Price, MD. She is s/sp multiple extractions with alveoloplasty, preprostatic surgery as needed on 03/14/17, but required admission (03/14/17-03/16/17) due to AKI (acute on chronic KD) and anemia (requiring 1 Unit PRBC). Losartan and Lasix held. Nell Range, PA (TAVR team) saw patient while at PAT 03/18/17 and was to review PAT labs to ensure felt stable for 03/22/17 surgery.  - PCP is Michell Heinrich, DO - Primary cardiologist is Delanna Notice, MD [place], [place]) - Nephrologist is Lance Coon, Brooke Bonito MD [facility], [place], [place])  PMH includes:  CAD (70% CX, medically managed), severe aortic stenosis, severe mitral stenosis (medically managed due to co-morbidities), HTN, hyperlipidemia, asthma, CKD (stage IV), DM2, COPD, anemia, gallbladder disease (appears was scheduled for surgery 11/2016, but was cancelled until cleared by cardiology). Never smoker (passive smoking exposure). BMI 22.  Medications include: albuterol, amlodipine, ASA 81 mg, Lipitor, Symbicort, ferrous sulfate, Norco, Atrovent, Lantus, Amitiza, Singulair.  Pulse 98   Temp 37.1 C   Resp 18   Ht 4\' 10"  (1.473 m)   Wt 105 lb 1.6 oz (47.7 kg)   SpO2 95%   BMI 21.97 kg/m  It appears BP was not recorded at PAT.  EKG 03/18/17: NSR at 97 bpm. Moderate voltage criteria for LVH, may be normal variant.   Echo 03/07/17:  - Left ventricle: The cavity size was normal. There was moderateconcentric hypertrophy. Systolic function was vigorous. Theestimated ejection fraction was in the range of 65% to 70%. Wall motion was normal; there were no regional wall motionabnormalities. Doppler parameters are consistent with abnormalleft ventricular relaxation (grade 1 diastolic dysfunction).Doppler parameters are consistent with both elevated  ventricularend-diastolic filling pressure and elevated left atrial fillingpressure. - Aortic valve: Trileaflet; severely thickened, severely calcifiedleaflets. Valve mobility was restricted. There was trivialregurgitation. Mean gradient (S): 91 mm Hg. Peak gradient (S):131 mm Hg. - Ascending aorta: The ascending aorta was normal in size. - Mitral valve: Calcified annulus. Severely thickened, severelycalcified leaflets . Mobility was restricted. The findings areconsistent with severe stenosis. There was mild regurgitation.Peak gradient (D): 23 mm Hg. - Left atrium: The atrium was severely dilated. - Right ventricle: The cavity size was normal. Wall thickness wasnormal. Systolic function was normal. - Pulmonary arteries: Systolic pressure was within the normalrange. - Inferior vena cava: The vessel was normal in size. - Impressions: Normal LVEF. Severely elevated LVEDP.Critical aortic stenosis with peak/mean transaortic gradients131/91 mmHg.Severe mitral stenosis with mean gradient 11 mmHg.  R/L cardiac cath 02/09/17:   Mid RCA to Dist RCA lesion, 40 %stenosed.  Prox RCA to Mid RCA lesion, 20 %stenosed.  Ost Cx to Prox Cx lesion, 70 %stenosed.  Ost 3rd Mrg to 3rd Mrg lesion, 40 %stenosed.  Prox LAD to Mid LAD lesion, 30 %stenosed.  Dist LAD lesion, 20 %stenosed.  Ost 1st Diag lesion, 40 %stenosed.  Ost 2nd Diag to 2nd Diag lesion, 30 %stenosed.  There is severe aortic valve stenosis. 1. Single vessel CAD with moderately severe, calcified stenosis in the proximal Circumflex artery 2. Mild non-obstructive disease in the RCA and LAD 3. Severe aortic stenosis (mean gradient 18.76mmHg, peak gradient 35 mmHg, AVA 1.26 cm2 ).   CT Coronary 03/01/17: IMPRESSION: 1) Calcified tri leaflet aortic valve with annular area of 396 mm2 suitable for a 23 mm Sapien 3 valve and perimeter of 72.7 mm suitable for a 26 mm Evolut  Pro valve 2) Optimum angiographic angle for deployment LAO 17  degrees Caudal 9 degrees 3) Coronary arteries sufficient height above annulus for deployment 4) Normal aortic root and arch vessels with moderate calcific atherosclerosis 5) Significant nodular calcium at base of left annular sinus extending into the intervalvular fibrosa. Also appears to be significant thickening and restriction to motion of mitral valve suggest echo correlation 6) Moderate LAE with no LAA thrombus  Carotid duplex 03/01/17:  - Right Carotid: There is evidence in the right ICA of a 1-39% stenosis. - Left Carotid: There is evidence in the left ICA of a 1-39% stenosis. - Vertebrals: Both vertebral arteries were patent with antegrade flow.  CTA chest/abd/pelvis 03/01/17: IMPRESSION: 1. Vascular findings and measurements pertinent to potential TAVR procedure, as detailed above (see full report). 2. Severe thickening and calcification of the aortic valve, compatible with the reported clinical history of severe aortic stenosis. 3. Aortic Atherosclerosis (ICD10-I70.0). Left main and 3 vessel coronary atherosclerosis. Mild cardiomegaly. 4. 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 Follow-up adrenal protocol CT abdomen without and with IV contrast could be considered in 12 months. This recommendation follows ACR consensus guidelines: Management of Incidental Adrenal Masses: A White Paper of the ACR Incidental Findings Committee. J Am Coll Radiol 2017;14:1038-1044. 5. Pneumobilia, correlate for any history of sphincterotomy. Mild common bile duct dilation (11 mm diameter) without intrahepatic dilation. No radiopaque cholelithiasis or choledocholithiasis. Recommend correlation with serum bilirubin levels. MRI abdomen with MRCP without and with IV contrast could be considered for further evaluation as clinically warranted. 6. Additional chronic findings as detailed (see full report).  CXR 03/18/17: IMPRESSION: Mild cardiomegaly.  No  active cardiopulmonary disease.  PFTs 03/07/17: FVC 1.43 (62%), FEV1 0.89 (51%), DLCO unc 7.96 (49%).  Preoperative labs noted. Random glucose 249. A1c 7.4. BUN 83, Cr 2.39, stable when compared to labs from earlier this month (BUN 76-98, Cr 2.45-2.72), but up since 01/2017 (BUN ~ 50, Cr 1.59-1.86). BNP 182. H/H 9.4/29.0 (stable since 03/15/17), PLT 101K. PT/PTT WNL. Due to recent transfusion, she needs T&S on the day of surgery.  She needs an ABG on the day of surgery.  Anesthesiologist and surgeon(s) to evaluate on the day of surgery to determine definitive anesthesia plan. Although labs (renal function, anemia, thrombocytopenia) remain abnormal, they appear stable since her hospital discharge earlier this month. BP was not documented, so patient will get vitals on arrival.  George Hugh [facility] Short Stay Center/Anesthesiology Phone [phone] 03/21/2017 11:57 AM

## 2017-03-21 NOTE — H&P (Signed)
[address]       [place] [address]             [phone]          CARDIOTHORACIC SURGERY HISTORY AND PHYSICAL EXAM  Referring Provider is Delanna Notice, MD PCP is Michell Heinrich, DO     Chief Complaint  Patient presents with  . 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  Patient states that Sonya Carpenter has known of the presence of a heart murmur for several years.  Sonya Carpenter was seen approximately 3 years ago by Dr. Rosalita Chessman and echocardiogram reportedly revealed findings consistent with moderate aortic stenosis.  Patient was lost to follow-up for a period of time primarily because Sonya Carpenter husband was in poor health.  The patient was Sonya Carpenter husband's primary caretaker and remained reasonably active and functionally independent until several months ago when Sonya Carpenter was hospitalized in [place] for what was initially felt to be pneumonia.  During that hospitalization Sonya Carpenter was thought to have a possible abdominal mass with biliary ductal dilitation. Sonya Carpenter was referred to [facility] in [place] where Sonya Carpenter was diagnosed with choledochocholelithiasis by ERCP.  Sonya Carpenter did not have an abdominal mass but Sonya Carpenter was noted to have some gallstones with intermittent episodes of right upper quadrant abdominal discomfort brought on with large meals.  Elective cholecystectomy was planned but preoperative cardiac clearance requested due to the presence of Sonya Carpenter aortic stenosis.  Sonya Carpenter was subsequently readmitted to the hospital in [place] in September with shortness of breath associated  with large right pleural effusion requiring thoracentesis.  Sonya Carpenter was treated for possible pneumonia.  Transthoracic echocardiogram revealed severe aortic stenosis with peak and mean transvalvular gradients reported 121 and 69 mmHg, respectively.  Left ventricular systolic function remains normal with ejection fraction estimated 65%.  Sonya Carpenter was referred to Dr. Burt Knack for consultation and underwent left and right heart catheterization by Dr. Angelena Form on February 09, 2017.  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  Peak to peak and mean transvalvular gradients across the aortic valve were measured 35 and 18.2 mmHg corresponding to aortic valve area calculated 1.26 cm squared.  Pulmonary function tests and CT angiography was performed and the patient was referred for elective surgical consultation.  Patient is recently widowed and lives alone in [place].  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  Sonya Carpenter does not exercise on a regular basis and Sonya Carpenter reports living a somewhat sedentary lifestyle, but Sonya Carpenter reports no significant physical limitations.  Sonya Carpenter does complain of progressive symptoms of exertional shortness of breath.  Symptoms of exertional shortness of breath date back many years.  They have gotten progressively worse over the last 4-5 months.  Sonya Carpenter currently gets short of breath with moderate low level activity and was recently hospitalized with resting shortness of breath and orthopnea.  Sonya Carpenter has had some lower extremity edema.  Symptoms have been somewhat improved on medical therapy.  Sonya Carpenter has not had chest pain or chest tightness either with activity or at  rest.  Sonya Carpenter has not had dizzy spells or syncope.  Patient has never been a smoker but Sonya Carpenter husband was a heavy smoker.  Sonya Carpenter reports that Sonya Carpenter does have very poor dentition with several  loose teeth.  Sonya Carpenter has not seen a dentist in quite some time.    Past Medical History:  Diagnosis Date  . Anemia   . Aortic stenosis   . Arthritis    back  . Asthma   . CAD (coronary artery disease)   . Chronic diastolic CHF (congestive heart failure) ([facility])   . CKD (chronic kidney disease), stage IV ([place])   . COPD (chronic obstructive pulmonary disease) ([place])   . Diabetes mellitus without complication ([place])    type 2  . Gallbladder disease   . Headache   . Heart murmur   . Hyperlipidemia   . Hypertension   . Pneumonia   . Vitamin D deficiency     Past Surgical History:  Procedure Laterality Date  . BREAST CYST EXCISION Right   . COLONSCOPY    . ELBOW SURGERY Left   . GALLBLADDER SURGERY     has not had yet   . KNEE SURGERY    . MULTIPLE EXTRACTIONS WITH ALVEOLOPLASTY N/A 03/14/2017   Procedure: Extraction of tooth #'s 2-11, 15, 19-28 and 31 with alveoloplasty, bilateral mandibular tori reductions and bilateral mandibular lingual exostoses reductions.;  Surgeon: Lenn Cal, DDS;  Location: [facility];  Service: Oral Surgery;  Laterality: N/A;  . REFRACTIVE SURGERY    . RIGHT/LEFT HEART CATH AND CORONARY ANGIOGRAPHY N/A 02/09/2017   Procedure: RIGHT/LEFT HEART CATH AND CORONARY ANGIOGRAPHY;  Surgeon: Burnell Blanks, MD;  Location: [facility];  Service: Cardiovascular;  Laterality: N/A;  . TONSILLECTOMY      Family History  Problem Relation Age of Onset  . Hypertension Mother   . Kidney failure Mother   . Heart disease Mother   . Hypertension Father   . Heart disease Father   . Diabetes Father   . Cancer Sister     Social History Social History   Tobacco Use  . Smoking status: Passive Smoke Exposure - Never Smoker  . Smokeless tobacco: Never Used  Substance Use Topics  . Alcohol use: No    Frequency: Never  . Drug use: No    Prior to Admission medications   Medication Sig Start Date End Date Taking? Authorizing Provider    acetaminophen (TYLENOL) 500 MG tablet Take 500 mg by mouth every 6 (six) hours as needed for mild pain or moderate pain.    [provider]  albuterol (PROVENTIL HFA;VENTOLIN HFA) 108 (90 Base) MCG/ACT inhaler Inhale 2 puffs every 6 (six) hours as needed into the lungs for wheezing or shortness of breath.     [provider]  albuterol (PROVENTIL) (2.5 MG/3ML) 0.083% nebulizer solution Take 2.5 mg 3 (three) times daily as needed by nebulization for wheezing or shortness of breath.    [provider]  amLODipine (NORVASC) 10 MG tablet Take 1 tablet (10 mg total) by mouth daily. 03/16/17   Eileen Stanford, PA-C  aspirin EC 81 MG tablet Take 81 mg by mouth daily.    [provider]  atorvastatin (LIPITOR) 20 MG tablet Take 20 mg by mouth daily. 01/31/17   [provider]  budesonide-formoterol (SYMBICORT) 160-4.5 MCG/ACT inhaler Inhale 2 puffs 2 (two) times daily as needed into the lungs (for shortness of breath).     [provider]  cetirizine (ZYRTEC) 10 MG tablet Take 10 mg at bedtime by mouth.     [provider]  chlorhexidine (PERIDEX) 0.12 % solution Use as directed 15 mLs in the mouth or throat 2 (two) times daily. 03/16/17   Eileen Stanford, PA-C  Dextran 70-Hypromellose (ARTIFICIAL TEARS) 0.1-0.3 % SOLN Place 2 drops as needed into both eyes (for dry eyes).     [provider]  docusate sodium (COLACE) 100 MG capsule Take 100 mg 2 (two) times daily as needed by mouth for mild constipation.    [provider]  ferrous sulfate 325 (65 FE) MG EC tablet Take 325 mg by mouth 2 (two) times daily.     [provider]  HYDROcodone-acetaminophen (NORCO/VICODIN) 5-325 MG tablet Take 1-2 tablets by mouth every 6 (six) hours as needed for moderate pain. 03/16/17   Eileen Stanford, PA-C  ipratropium (ATROVENT) 0.02 % nebulizer solution Take 0.5 mg by nebulization 2 (two) times daily.    [provider]  LANTUS SOLOSTAR 100 UNIT/ML Solostar Pen Inject 30 Units every morning into the skin.  10/29/16   [provider]  lubiprostone (AMITIZA) 8 MCG capsule Take 8 mcg 2 (two) times daily as needed by mouth for constipation.     [provider]  montelukast (SINGULAIR) 10 MG tablet Take 10 mg by mouth at bedtime.    [provider]  Multiple Vitamins-Minerals (MULTIVITAMIN WITH MINERALS) tablet Take 1 tablet by mouth daily.    [provider]  Naphazoline HCl (CLEAR EYES OP) Place 2 drops 2 (two) times daily into both eyes.    [provider]  protein supplement shake (PREMIER PROTEIN) LIQD Take 325 mLs (11 oz total) by mouth 3 (three) times daily between meals. 03/16/17   Eileen Stanford, PA-C  sodium chloride (OCEAN) 0.65 % SOLN nasal spray Place 2 sprays 2 (two) times daily as needed into both nostrils for congestion.     [provider]    No Known Allergies   Review of Systems:              General:                      normal appetite, decreased energy, no weight gain, no weight loss, no fever             Cardiac:                       no chest pain with exertion, no chest pain at rest, +SOB with exertion, occasional resting SOB, no PND, + orthopnea, no palpitations, no arrhythmia, no atrial fibrillation, + LE edema, no dizzy spells, no syncope             Respiratory:                 + shortness of breath, no home oxygen, no productive cough, + dry cough, + bronchitis, no wheezing, no hemoptysis, + asthma, no pain with inspiration or cough, no sleep apnea, no CPAP at night             GI:                               no difficulty swallowing, no reflux, no frequent heartburn, no hiatal hernia, + abdominal pain, + constipation, no diarrhea, no hematochezia, no hematemesis, no melena  GU:                              no dysuria,  no frequency, no urinary tract infection, no hematuria, no kidney stones, + chronic  kidney disease             Vascular:                     no pain suggestive of claudication, no pain in feet, no leg cramps, no varicose veins, no DVT, no non-healing foot ulcer             Neuro:                         no stroke, no TIA's, no seizures, no headaches, no temporary blindness one eye,  no slurred speech, no peripheral neuropathy, no chronic pain, no instability of gait, no memory/cognitive dysfunction             Musculoskeletal:         + arthritis, no joint swelling, no myalgias, no difficulty walking, normal mobility              Skin:                            no rash, no itching, + skin infections, no pressure sores or ulcerations             Psych:                         no anxiety, no depression, no nervousness, no unusual recent stress             Eyes:                           + blurry vision, no floaters, no recent vision changes, + wears glasses or contacts             ENT:                            + hearing loss, no loose or painful teeth, no dentures             Hematologic:               + easy bruising, no abnormal bleeding, no clotting disorder, no frequent epistaxis             Endocrine:                   + diabetes, does check CBG's at home                                                       Physical Exam:              BP 130/72   Pulse 94   Ht 4\' 10"  (1.473 m)   Wt 110 lb (49.9 kg)   SpO2 98%   BMI 22.99 kg/m              General:  Somewhat frail-appearing             HEENT:                       Unremarkable.  Poor dentition             Neck:                           no JVD, no bruits, no adenopathy              Chest:                          clear to auscultation, symmetrical breath sounds, no wheezes, no rhonchi              CV:                              RRR, grade IV/VI crescendo/decrescendo murmur heard best at RUSB,  no diastolic murmur             Abdomen:                    soft, non-tender, no masses               Extremities:                 warm, well-perfused, pulses diminished, no LE edema             Rectal/GU                   Deferred             Neuro:                         Grossly non-focal and symmetrical throughout             Skin:                            Clean and dry, no rashes, no breakdown   Diagnostic Tests:  TRANSTHORACIC ECHOCARDIOGRAM  Both images and report from transthoracic echocardiogram performed December 08, 2016 example [place] are reviewed.  The patient appears to have critical aortic stenosis with peak velocity measured across the aortic valve greater than 5 m/s.  1 of the patient's aortic 3 valve leaflets is heavily calcified and thickened and essentially immobile.  Third leaflet appears to move reasonably well.  Left ventricular systolic function remains vigorous with at least moderate left ventricular hypertrophy.  There is mild thickening of the mitral valve and transvalvular gradient was mildly elevated although the mitral valve leaflets appear to move reasonably well.  Elevated transvalvular velocities potentially could be affected by high flow, but the patient clearly has significant aortic stenosis.    RIGHT/LEFT HEART CATH AND CORONARY ANGIOGRAPHY  Conclusion     Mid RCA to Dist RCA lesion, 40 %stenosed.  Prox RCA to Mid RCA lesion, 20 %stenosed.  Ost Cx to Prox Cx lesion, 70 %stenosed.  Ost 3rd Mrg to 3rd Mrg lesion, 40 %stenosed.  Prox LAD to Mid LAD lesion, 30 %stenosed.  Dist LAD lesion, 20 %stenosed.  Ost 1st Diag lesion, 40 %stenosed.  Ost 2nd Diag to 2nd Diag lesion, 30 %stenosed.  There is severe  aortic valve stenosis.  1. Single vessel CAD with moderately severe, calcified stenosis in the proximal Circumflex artery 2. Mild non-obstructive disease in the RCA and LAD 3. Severe aortic stenosis (mean gradient 18.50mmHg, peak gradient 35 mmHg, AVA 1.26 cm2 ).   Recommendations: Will review films with the TAVR team. We may be  able to manage Sonya Carpenter CAD medically. Will continue workup for TAVR.    Indications   Severe aortic stenosis [I35.0 (ICD-10-CM)]  Procedural Details/Technique   Technical Details Indication: 71 yo female with h/o DM, severe aortic stenosis. Pre-TAVR workup.   Procedure: The risks, benefits, complications, treatment options, and expected outcomes were discussed with the patient. The patient and/or family concurred with the proposed plan, giving informed consent. The patient was brought to the cath lab after IV hydration was given. The patient was further sedated with Versed and Fentanyl. There was an IV catheter present in the right antecubital vein. This was prepped and draped. I then changed out this catheter for a % French sheath. Right heart cath performed with a balloon tipped catheter. The right wrist was prepped and draped in a sterile fashion. 1% lidocaine was used for local anesthesia. Using the modified Seldinger access technique, a 5 French sheath was placed in the right radial artery. 3 mg Verapamil was given through the sheath. 3000 units IV heparin was given. Standard diagnostic catheters were used to perform selective coronary angiography. I crossed the aortic valve with an AL-1 catheter and a straight wire. The sheath was removed from the right radial artery and a Terumo hemostasis band was applied at the arteriotomy site on the right wrist.     Estimated blood loss <50 mL.  During this procedure the patient was administered the following to achieve and maintain moderate conscious sedation: Versed 1 mg, Fentanyl 25 mcg, while the patient's heart rate, blood pressure, and oxygen saturation were continuously monitored. The period of conscious sedation was 32 minutes, of which I was present face-to-face 100% of this time.  Complications   Complications documented before study signed (02/09/2017 1:45 PM EDT)    RIGHT/LEFT HEART CATH AND CORONARY ANGIOGRAPHY   None Documented by  Burnell Blanks, MD 02/09/2017 1:44 PM EDT  Time Range: Intra-procedure      Coronary Findings   Diagnostic  Dominance: Right  Left Anterior Descending  Prox LAD to Mid LAD lesion 30% stenosed  Prox LAD to Mid LAD lesion.  Dist LAD lesion 20% stenosed  Dist LAD lesion.  First Diagonal Branch  Vessel is small in size.  Ost 1st Diag lesion 40% stenosed  Ost 1st Diag lesion.  Second Diagonal Branch  Vessel is small in size.  Ost 2nd Diag to 2nd Diag lesion 30% stenosed  Ost 2nd Diag to 2nd Diag lesion.  Ramus Intermedius  Vessel is small.  Left Circumflex  Vessel is moderate in size.  Ost Cx to Prox Cx lesion 70% stenosed  The lesion is calcified.  First Obtuse Marginal Branch  Vessel is small in size.  Second Obtuse Marginal Branch  Vessel is moderate in size.  Third Obtuse Marginal Branch  Vessel is small in size.  Ost 3rd Mrg to 3rd Mrg lesion 40% stenosed  Ost 3rd Mrg to 3rd Mrg lesion.  Right Coronary Artery  Prox RCA to Mid RCA lesion 20% stenosed  Prox RCA to Mid RCA lesion.  Mid RCA to Dist RCA lesion 40% stenosed  The lesion is calcified.  Intervention   No interventions have been documented.  Left Heart   Aortic Valve There is severe aortic valve stenosis.  Coronary Diagrams   Diagnostic Diagram       Implants        No implant documentation for this case.  MERGE Images   Show images for CARDIAC CATHETERIZATION   Link to Procedure Log   Procedure Log    Hemo Data    Most Recent Value  Fick Cardiac Output 5.17 L/min  Fick Cardiac Output Index 3.67 (L/min)/BSA  Aortic Mean Gradient 18.2 mmHg  Aortic Peak Gradient 35 mmHg  Aortic Valve Area 1.26  Aortic Value Area Index 0.89 cm2/BSA  RA A Wave 5 mmHg  RA V Wave 2 mmHg  RA Mean 2 mmHg  RV Systolic Pressure 39 mmHg  RV Diastolic Pressure 1 mmHg  RV EDP 3 mmHg  PA Systolic Pressure 35 mmHg  PA Diastolic Pressure 13 mmHg  PA Mean 22 mmHg  PW A Wave 20 mmHg  PW V  Wave 19 mmHg  PW Mean 13 mmHg  AO Systolic Pressure 093 mmHg  AO Diastolic Pressure 50 mmHg  AO Mean 78 mmHg  LV Systolic Pressure 818 mmHg  LV Diastolic Pressure 4 mmHg  LV EDP 10 mmHg  Arterial Occlusion Pressure Extended Systolic Pressure 299 mmHg  Arterial Occlusion Pressure Extended Diastolic Pressure 56 mmHg  Arterial Occlusion Pressure Extended Mean Pressure 87 mmHg  Left Ventricular Apex Extended Systolic Pressure 371 mmHg  Left Ventricular Apex Extended Diastolic Pressure 5 mmHg  Left Ventricular Apex Extended EDP Pressure 9 mmHg  QP/QS 1  TPVR Index 6 HRUI  TSVR Index 21.27 HRUI  PVR SVR Ratio 0.12  TPVR/TSVR Ratio 0.28    Cardiac TAVR CT  MEDICATIONS: None  TECHNIQUE: The patient was scanned on a Siemens Force 696 slice scanner. A 120 kV retrospective scan was triggered in the ascending thoracic aorta at 140 HU's. Gantry rotation speed was 250 msecs and collimation was .6 mm. No beta blockade or nitro were given. The 3D data set was reconstructed in 5% intervals of the R-R cycle. Systolic and diastolic phases were analyzed on a dedicated work station using MPR, MIP and VRT modes. The patient received 80 cc of contrast.  The patient was scanned on a Siemens Force 789 slice scanner. Gantry rotation speed was 250 msecs. Collimation was .6 mm. A 100 kV prospective scan was triggered in the ascending thoracic aorta at 140 HU's Full mA was used between 35% and 75% of the R-R interval. Average HR during the scan was 80 bpm. The 3D data set was interpreted on a dedicated work station using MPR, MIP and VRT modes. A total of 80cc of contrast was used for the entire study including the chest CTA and run off. Patient had bicarbonate drip prior to procedure for CRF.  FINDINGS: Aortic Valve: Tri leaflet and moderately calcified with restricted leaflet motion.  Aorta: Moderate atherosclerotic disease with normal arch vessel origin  Sino-tubular Junction: 24  mm  Ascending Thoracic Aorta: 30 mm  Aortic Arch: 23 mm  Descending Thoracic Aorta: 21 mm  Sinus of Valsalva Measurements:  Non-coronary: 31 mm  Right - coronary: 30 mm  Left - coronary: 30 mm  Coronary Artery Height above Annulus:  Left Main: 11.5 mm above annulus  Right Coronary: 16.4 mm above annulus  Virtual Basal Annulus Measurements:  Maximum / Minimum Diameter: 26.6 mm x 19.2 mm elliptical  Perimeter: 72.7 mm  Area: 296 mm2  Coronary Arteries: Sufficient height above annulus for deployment  Optimum Fluoroscopic Angle for Delivery: LAO 17 degrees Caudal 9 degrees  IMPRESSION: 1) Calcified tri leaflet aortic valve with annular area of 396 mm2 suitable for a 23 mm Sapien 3 valve and perimeter of 72.7 mm suitable for a 26 mm Evolut Pro valve  2) Optimum angiographic angle for deployment LAO 17 degrees Caudal 9 degrees  3) Coronary arteries sufficient height above annulus for deployment  4) Normal aortic root and arch vessels with moderate calcific atherosclerosis  5) Significant nodular calcium at base of left annular sinus extending into the intervalvular fibrosa. Also appears to be significant thickening and restriction to motion of mitral valve suggest echo correlation  6) Moderate LAE with no LAA thrombus  Jenkins Rouge  Electronically Signed: By: Jenkins Rouge M.D. On: 03/01/2017 14:57  CT ANGIOGRAPHY CHEST, ABDOMEN AND PELVIS  TECHNIQUE: Multidetector CT imaging through the chest, abdomen and pelvis was performed using the standard protocol during bolus administration of intravenous contrast. Multiplanar reconstructed images and MIPs were obtained and reviewed to evaluate the vascular anatomy.  CONTRAST: 80 cc Isovue 370 IV.  COMPARISON: None.  FINDINGS: CTA CHEST FINDINGS  Cardiovascular: Mild cardiomegaly. There is diffuse thickening and coarse calcification of the aortic valve. No  significant pericardial fluid/thickening. Left main, left anterior descending, left circumflex and right coronary atherosclerosis . Atherosclerotic nonaneurysmal thoracic aorta. No evidence of acute intramural hematoma, dissection, pseudoaneurysm or penetrating atherosclerotic ulcer. Aortic arch branch vessels are patent. Top-normal caliber main pulmonary artery (3.0 cm diameter). No central pulmonary emboli.  Mediastinum/Nodes: Multinodular goiter with several hypodense nodules throughout the bilateral thyroid lobes, largest 1.1 cm in the upper right thyroid lobe. Mildly patulous thoracic esophagus. Small fluid level in the lower thoracic esophagus. No pathologically enlarged axillary, mediastinal or hilar lymph nodes.  Lungs/Pleura: No pneumothorax. No pleural effusion. No acute consolidative airspace disease, lung masses or significant pulmonary nodules. Nonspecific diffuse bronchial wall thickening.  Musculoskeletal: No aggressive appearing focal osseous lesions. Moderate thoracic spondylosis.  CTA ABDOMEN AND PELVIS FINDINGS  Hepatobiliary: Normal liver size. Nonspecific heterogeneous liver parenchymal enhancement with numerous subcentimeter nodular foci of hyperenhancement throughout the liver, probably representing transient hepatic attenuation differences due to the early phase of contrast. No definite liver masses. Normal gallbladder with no radiopaque cholelithiasis. There is pneumobilia in the left central bile ducts. No intrahepatic biliary ductal dilatation. Dilated common bile duct (11 mm diameter) with smooth distal tapering. No radiopaque choledocholithiasis.  Pancreas: No pancreatic mass. No pancreatic duct dilation.  Spleen: Normal size. No mass.  Adrenals/Urinary Tract: Right adrenal 1.5 cm nodule with density 103 HU (series 14/ image 81). No left adrenal nodules. No hydronephrosis. Small simple left renal cysts, largest 1.5 cm. Additional  subcentimeter hypodense renal cortical lesions in both kidneys, too small to characterize, requiring no follow-up. Normal bladder.  Stomach/Bowel: Grossly normal stomach. Normal caliber small bowel with no small bowel wall thickening. Normal appendix. Mild scattered colonic diverticulosis, with no large bowel wall thickening or pericolonic fat stranding.  Vascular/Lymphatic: Atherosclerotic nonaneurysmal abdominal aorta. Patent portal, splenic and renal veins. No pathologically enlarged lymph nodes in the abdomen or pelvis.  Reproductive: Mildly enlarged myomatous uterus with coarsely calcified posterior uterine 4.8 cm subserosal fibroid. No adnexal masses.  Other: No pneumoperitoneum, ascites or focal fluid collection.  Musculoskeletal: No aggressive appearing focal osseous lesions. Mild lumbar spondylosis. Anterolisthesis at L4-5 measures 8 mm.  VASCULAR MEASUREMENTS PERTINENT TO TAVR:  AORTA:  Minimal Aortic Diameter - 13 x 13 mm (infrarenal abdominal aorta)  Severity of Aortic Calcification -  moderate  RIGHT PELVIS:  Right Common Iliac Artery -  Minimal Diameter - 7.2 x 5.1 mm  Tortuosity - mild-to-moderate  Calcification - moderate  There is a moderate focal atherosclerotic stenosis in the right common iliac artery approximately 2 cm below the aortic bifurcation.  Right External Iliac Artery -  Minimal Diameter - 6.1 x 5.9 mm  Tortuosity - mild  Calcification - mild  Right Common Femoral Artery -  Minimal Diameter - 6.0 x 5.5 mm  Tortuosity - mild  Calcification - moderate to severe  LEFT PELVIS:  Left Common Iliac Artery -  Minimal Diameter - 8.1 x 7.2 mm  Tortuosity - moderate  Calcification - moderate  Left External Iliac Artery -  Minimal Diameter - 6.0 x 5.9 mm  Tortuosity - mild  Calcification - mild  Left Common Femoral Artery -  Minimal Diameter - 5.3 x 3.8 mm  Tortuosity -  mild  Calcification - moderate to severe  There is early bifurcation of the left common femoral artery.  Review of the MIP images confirms the above findings.  IMPRESSION: 1. Vascular findings and measurements pertinent to potential TAVR procedure, as detailed above. 2. Severe thickening and calcification of the aortic valve, compatible with the reported clinical history of severe aortic stenosis. 3. Aortic Atherosclerosis (ICD10-I70.0). Left main and 3 vessel coronary atherosclerosis. Mild cardiomegaly. 4. 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 Follow-up adrenal protocol CT abdomen without and with IV contrast could be considered in 12 months. This recommendation follows ACR consensus guidelines: Management of Incidental Adrenal Masses: A White Paper of the ACR Incidental Findings Committee. J Am Coll Radiol 2017;14:1038-1044. 5. Pneumobilia, correlate for any history of sphincterotomy. Mild common bile duct dilation (11 mm diameter) without intrahepatic dilation. No radiopaque cholelithiasis or choledocholithiasis. Recommend correlation with serum bilirubin levels. MRI abdomen with MRCP without and with IV contrast could be considered for further evaluation as clinically warranted. 6. Additional chronic findings as detailed.   Electronically Signed By: Ilona Sorrel M.D. On: 03/01/2017 14:58     Impression:  Patient has stage D severe symptomatic aortic stenosis.  Sonya Carpenter currently describes stable symptoms of exertional shortness of breath occurring with moderate low level activity consistent with chronic diastolic congestive heart failure, New [place] Heart Association functional class III.  Sonya Carpenter has recently been hospitalized on 2 occasions with resting shortness of breath that has been previously attributed to pneumonia but potentially consistent with acute exacerbations of class IV heart failure.  I have personally  reviewed the patient's transthoracic echocardiogram performed in [place] last August and the diagnostic cardiac catheterization performed recently in [place].  Echocardiogram and catheterization revealed somewhat contradictory findings with extremely elevated velocity across the aortic valve consistent with critical aortic stenosis.  However, 1 of the patient's 3 aortic valve leaflets appears to move reasonably well and transvalvular gradients measured at catheterization were considerably lower.  The patient's aortic valve annulus in size is relatively small and 1 of the 3 leaflets is extremely thick and essentially fixed.   I agree the patient should probably be treated with aortic valve replacement, although repeat transthoracic echocardiogram might be prudent.  Risks associated with conventional surgery would unquestionably be high because of the patient's numerous severe comorbid medical problems.  Cardiac-gated CTA of the heart reveals anatomical characteristics consistent with aortic stenosis suitable for treatment by transcatheter aortic valve replacement without any significant complicating features and CTA of the aorta and iliac vessels demonstrate what appears  to be adequate pelvic vascular access to facilitate a transfemoral approach, although Sonya Carpenter has significant atherosclerotic disease involving the common femoral arteries bilaterally, most notably on the left.  Sonya Carpenter might be at increased risk for vascular complications with percutaneous access.  Trans-carotid or trans-subclavian access could be considered if right transfemoral access proved to be problematic.  Sonya Carpenter has very poor dentition and may need dental extraction.    Plan:  The patient and Sonya Carpenter family were counseled at length regarding treatment alternatives for management of severe symptomatic aortic stenosis. Alternative approaches such as conventional aortic valve replacement, transcatheter aortic valve replacement, and  palliative medical therapy were compared and contrasted at length.  The risks associated with conventional surgical aortic valve replacement were been discussed in detail, as were expectations for post-operative convalescence, and why I would be reluctant to consider this patient a candidate for conventional surgery.  Issues specific to transcatheter aortic valve replacement were discussed including questions about long term valve durability, the potential for paravalvular leak, possible increased risk of need for permanent pacemaker placement, and other technical complications related to the procedure itself.  Long-term prognosis with medical therapy was discussed. This discussion was placed in the context of the patient's own specific clinical presentation and past medical history.  All of their questions been addressed.  The patient hopes to proceed with transcatheter aortic valve replacement in the near future.  As a next step the patient will undergo follow-up transthoracic echocardiogram to confirm the severity of aortic stenosis.  In addition, Sonya Carpenter will be referred for dental service consultation.  Sonya Carpenter will also undergo a second surgical consultation in the near future.   We tentatively plan to proceed with transcatheter aortic valve replacement via transfemoral approach on March 22, 2017.  Following the decision to proceed with transcatheter aortic valve replacement, a discussion has been held regarding what types of management strategies would be attempted intraoperatively in the event of life-threatening complications, including whether or not the patient would be considered a candidate for the use of cardiopulmonary bypass and/or conversion to open sternotomy for attempted surgical intervention.  The patient has been advised of a variety of complications that might develop including but not limited to risks of death, stroke, paravalvular leak, aortic dissection or other major vascular complications,  aortic annulus rupture, device embolization, cardiac rupture or perforation, mitral regurgitation, acute myocardial infarction, arrhythmia, heart block or bradycardia requiring permanent pacemaker placement, congestive heart failure, respiratory failure, renal failure, pneumonia, infection, other late complications related to structural valve deterioration or migration, or other complications that might ultimately cause a temporary or permanent loss of functional independence or other long term morbidity.  The patient provides full informed consent for the procedure as described and all questions were answered.     Valentina Gu. Roxy Manns, MD 03/07/2017    Transthoracic Echocardiography  (Report amended )  Patient:    Sonya Carpenter, Sonya Carpenter MR #:       [id number] Study Date: 03/07/2017 Gender:     F Age:        44 Height:     147.3 cm Weight:     49.9 kg BSA:        1.44 m^2 Pt. Status: Room:   ATTENDING    [name], MD  SONOGRAPHER  Cindy Hazy, RDCS  PERFORMING   [facility]  [place], Katie  REFERRING    [place], Katie  REFERRING    [place], Daniel  cc:  ------------------------------------------------------------------- LV EF: 65% -  70%  ------------------------------------------------------------------- Indications:      I35.9 Aortic Valve Disorder.  ------------------------------------------------------------------- History:   PMH:  Acquired from the patient and from the patient&'s chart.  PMH:  Anemia. Asthma. Chronic Kidney Disease. Coronary artery disease. Murmur. Shortness of Breath.  Risk factors: Hypertension. Diabetes mellitus. Dyslipidemia.  ------------------------------------------------------------------- Study Conclusions  - Left ventricle: The cavity size was normal. There was moderate   concentric hypertrophy. Systolic function was vigorous. The   estimated ejection fraction was in the range of 65% to 70%. Wall    motion was normal; there were no regional wall motion   abnormalities. Doppler parameters are consistent with abnormal   left ventricular relaxation (grade 1 diastolic dysfunction).   Doppler parameters are consistent with both elevated ventricular   end-diastolic filling pressure and elevated left atrial filling   pressure. - Aortic valve: Trileaflet; severely thickened, severely calcified   leaflets. Valve mobility was restricted. There was trivial   regurgitation. Mean gradient (S): 91 mm Hg. Peak gradient (S):   131 mm Hg. - Ascending aorta: The ascending aorta was normal in size. - Mitral valve: Calcified annulus. Severely thickened, severely   calcified leaflets . Mobility was restricted. The findings are   consistent with severe stenosis. There was mild regurgitation.   Peak gradient (D): 23 mm Hg. - Left atrium: The atrium was severely dilated. - Right ventricle: The cavity size was normal. Wall thickness was   normal. Systolic function was normal. - Pulmonary arteries: Systolic pressure was within the normal   range. - Inferior vena cava: The vessel was normal in size.  Impressions:  - Normal LVEF. Severely elevated LVEDP.   Critical aortic stenosis with peak/mean transaortic gradients   131/91 mmHg.   Severe mitral stenosis with mean gradient 11 mmHg.  ------------------------------------------------------------------- Study data:   Study status:  Routine.  Procedure:  The patient reported no pain pre or post test. Transthoracic echocardiography for left ventricular function evaluation, for right ventricular function evaluation, and for assessment of valvular function. Image quality was adequate.  Study completion:  There were no complications.          Transthoracic echocardiography.  M-mode, complete 2D, spectral Doppler, and color Doppler.  Birthdate: Patient birthdate: [birth year].  Age:  Patient is 71 yr old.  Sex: Gender: female.    BMI: 23 kg/m^2.  Blood  pressure:     136/60 Patient status:  Outpatient.  Study date:  Study date: 03/07/2017. Study time: 03:16 PM.  Location:  [facility]  -------------------------------------------------------------------  ------------------------------------------------------------------- Left ventricle:  The cavity size was normal. There was moderate concentric hypertrophy. Systolic function was vigorous. The estimated ejection fraction was in the range of 65% to 70%. Wall motion was normal; there were no regional wall motion abnormalities. Doppler parameters are consistent with abnormal left ventricular relaxation (grade 1 diastolic dysfunction). Doppler parameters are consistent with both elevated ventricular end-diastolic filling pressure and elevated left atrial filling pressure.  ------------------------------------------------------------------- Aortic valve:   Trileaflet; severely thickened, severely calcified leaflets. Valve mobility was restricted.  Doppler:  Transvalvular velocity was within the normal range. There was no stenosis. There was trivial regurgitation.    VTI ratio of LVOT to aortic valve: 0.2. Valve area (VTI): 0.57 cm^2. Indexed valve area (VTI): 0.4 cm^2/m^2. Peak velocity ratio of LVOT to aortic valve: 0.19. Valve area (Vmax): 0.55 cm^2. Indexed valve area (Vmax): 0.38 cm^2/m^2. Mean velocity ratio of LVOT to aortic valve: 0.23. Valve area (Vmean): 0.65 cm^2. Indexed valve area (Vmean): 0.45  cm^2/m^2. Mean gradient (S): 91 mm Hg. Peak gradient (S): 131 mm Hg.  ------------------------------------------------------------------- Aorta:  Aortic root: The aortic root was normal in size. Ascending aorta: The ascending aorta was normal in size.  ------------------------------------------------------------------- Mitral valve:   Calcified annulus. Severely thickened, severely calcified leaflets . Mobility was restricted.  Doppler:   The findings are consistent with  severe stenosis.   There was mild regurgitation.    Valve area by pressure half-time: 1.79 cm^2. Indexed valve area by pressure half-time: 1.25 cm^2/m^2. Valve area by continuity equation (using LVOT flow): 1.04 cm^2. Indexed valve area by continuity equation (using LVOT flow): 0.72 cm^2/m^2. Mean gradient (D): 11 mm Hg. Peak gradient (D): 23 mm Hg.  ------------------------------------------------------------------- Left atrium:  The atrium was severely dilated.  ------------------------------------------------------------------- Right ventricle:  The cavity size was normal. Wall thickness was normal. Systolic function was normal.  ------------------------------------------------------------------- Pulmonic valve:    Structurally normal valve.   Cusp separation was normal.  Doppler:  Transvalvular velocity was within the normal range. There was no evidence for stenosis. There was no regurgitation.  ------------------------------------------------------------------- Tricuspid valve:   Structurally normal valve.    Doppler: Transvalvular velocity was within the normal range. There was no regurgitation.  ------------------------------------------------------------------- Pulmonary artery:   The main pulmonary artery was normal-sized. Systolic pressure was within the normal range.  ------------------------------------------------------------------- Right atrium:  The atrium was normal in size.  ------------------------------------------------------------------- Pericardium:  There was no pericardial effusion.  ------------------------------------------------------------------- Systemic veins: Inferior vena cava: The vessel was normal in size.  ------------------------------------------------------------------- Measurements   Left ventricle                            Value          Reference  LV ID, ED, PLAX chordal           (L)     36.3  mm       43 - 52  LV ID,  ES, PLAX chordal           (L)     21.2  mm       23 - 38  LV fx shortening, PLAX chordal            42    %        >=29  LV PW thickness, ED                       15.4  mm       ---------  IVS/LV PW ratio, ED                       0.94           <=1.3  Stroke volume, 2D                         61    ml       ---------  Stroke volume/bsa, 2D                     42    ml/m^2   ---------  LV e&', lateral                            4.17  cm/s     ---------  LV E/e&', lateral  36.93          ---------  LV e&', medial                             4.17  cm/s     ---------  LV E/e&', medial                           36.93          ---------  LV e&', average                            4.17  cm/s     ---------  LV E/e&', average                          36.93          ---------    Ventricular septum                        Value          Reference  IVS thickness, ED                         14.5  mm       ---------    LVOT                                      Value          Reference  LVOT ID, S                                19    mm       ---------  LVOT area                                 2.84  cm^2     ---------  LVOT ID                                   19    mm       ---------  LVOT peak velocity, S                     99.6  cm/s     ---------  LVOT mean velocity, S                     80.1  cm/s     ---------  LVOT VTI, S                               21.5  cm       ---------  LVOT peak gradient, S                     4     mm Hg    ---------  Stroke volume (SV), LVOT DP  61    ml       ---------  Stroke index (SV/bsa), LVOT DP            42.4  ml/m^2   ---------    Aortic valve                              Value          Reference  Aortic valve peak velocity, S             516   cm/s     ---------  Aortic valve mean velocity, S             351   cm/s     ---------  Aortic valve VTI, S                       108   cm       ---------  Aortic mean  gradient, S                   57    mm Hg    ---------  Aortic peak gradient, S                   107   mm Hg    ---------  VTI ratio, LVOT/AV                        0.2            ---------  Aortic valve area, VTI                    0.57  cm^2     ---------  Aortic valve area/bsa, VTI                0.4   cm^2/m^2 ---------  Velocity ratio, peak, LVOT/AV             0.19           ---------  Aortic valve area, peak velocity          0.55  cm^2     ---------  Aortic valve area/bsa, peak               0.38  cm^2/m^2 ---------  velocity  Velocity ratio, mean, LVOT/AV             0.23           ---------  Aortic valve area, mean velocity          0.65  cm^2     ---------  Aortic valve area/bsa, mean               0.45  cm^2/m^2 ---------  velocity    Aorta                                     Value          Reference  Aortic root ID, ED                        32    mm       ---------  Ascending aorta ID, A-P, S  33    mm       ---------    Left atrium                               Value          Reference  LA ID, A-P, ES                            53    mm       ---------  LA ID/bsa, A-P                    (H)     3.69  cm/m^2   <=2.2  LA volume, S                              69    ml       ---------  LA volume/bsa, S                          48    ml/m^2   ---------  LA volume, ES, 1-p A4C                    66    ml       ---------  LA volume/bsa, ES, 1-p A4C                45.9  ml/m^2   ---------  LA volume, ES, 1-p A2C                    72    ml       ---------  LA volume/bsa, ES, 1-p A2C                50.1  ml/m^2   ---------    Mitral valve                              Value          Reference  Mitral E-wave peak velocity               154   cm/s     ---------  Mitral A-wave peak velocity               202   cm/s     ---------  Mitral mean velocity, D                   150   cm/s     ---------  Mitral deceleration time          (H)     338   ms       150 - 230   Mitral pressure half-time                 126   ms       ---------  Mitral mean gradient, D                   10    mm Hg    ---------  Mitral peak gradient, D                   9  mm Hg    ---------  Mitral E/A ratio, peak                    0.8            ---------  Mitral valve area, PHT, DP                1.79  cm^2     ---------  Mitral valve area/bsa, PHT, DP            1.25  cm^2/m^2 ---------  Mitral valve area, LVOT                   1.04  cm^2     ---------  continuity  Mitral valve area/bsa, LVOT               0.72  cm^2/m^2 ---------  continuity  Mitral annulus VTI, D                     58.9  cm       ---------    Right ventricle                           Value          Reference  RV s&', lateral, S                         18.6  cm/s     ---------  Legend: (L)  and  (H)  mark values outside specified reference range.  ------------------------------------------------------------------- Lance Morin, M.D. [id number]

## 2017-03-22 ENCOUNTER — Inpatient Hospital Stay (HOSPITAL_COMMUNITY): Payer: Medicare HMO

## 2017-03-22 ENCOUNTER — Encounter (HOSPITAL_COMMUNITY): Payer: Self-pay | Admitting: *Deleted

## 2017-03-22 ENCOUNTER — Inpatient Hospital Stay (HOSPITAL_COMMUNITY)
Admission: RE | Admit: 2017-03-22 | Discharge: 2017-04-02 | DRG: 266 | Disposition: A | Discharge status: Skilled Nursing Facility | Payer: Medicare HMO | Source: Ambulatory Visit | Attending: Thoracic Surgery (Cardiothoracic Vascular Surgery) | Admitting: Thoracic Surgery (Cardiothoracic Vascular Surgery)

## 2017-03-22 ENCOUNTER — Other Ambulatory Visit: Payer: Self-pay

## 2017-03-22 ENCOUNTER — Encounter (HOSPITAL_COMMUNITY)
Admission: RE | Disposition: A | Discharge status: Skilled Nursing Facility | Payer: Self-pay | Source: Ambulatory Visit | Attending: Cardiovascular Disease

## 2017-03-22 ENCOUNTER — Inpatient Hospital Stay (HOSPITAL_COMMUNITY)
Admission: RE | Admit: 2017-03-22 | Discharge: 2017-03-22 | Disposition: A | Discharge status: Home / Self Care | Payer: Medicare HMO | Source: Ambulatory Visit | Attending: Cardiovascular Disease | Admitting: Cardiovascular Disease

## 2017-03-22 DIAGNOSIS — Z006 Encounter for examination for normal comparison and control in clinical research program: Secondary | ICD-10-CM

## 2017-03-22 DIAGNOSIS — I080 Rheumatic disorders of both mitral and aortic valves: Secondary | ICD-10-CM | POA: Diagnosis present

## 2017-03-22 DIAGNOSIS — D631 Anemia in chronic kidney disease: Secondary | ICD-10-CM | POA: Diagnosis present

## 2017-03-22 DIAGNOSIS — Z8249 Family history of ischemic heart disease and other diseases of the circulatory system: Secondary | ICD-10-CM

## 2017-03-22 DIAGNOSIS — K922 Gastrointestinal hemorrhage, unspecified: Secondary | ICD-10-CM | POA: Diagnosis not present

## 2017-03-22 DIAGNOSIS — I361 Nonrheumatic tricuspid (valve) insufficiency: Secondary | ICD-10-CM | POA: Diagnosis not present

## 2017-03-22 DIAGNOSIS — N184 Chronic kidney disease, stage 4 (severe): Secondary | ICD-10-CM | POA: Diagnosis present

## 2017-03-22 DIAGNOSIS — E11649 Type 2 diabetes mellitus with hypoglycemia without coma: Secondary | ICD-10-CM | POA: Diagnosis not present

## 2017-03-22 DIAGNOSIS — K8064 Calculus of gallbladder and bile duct with chronic cholecystitis without obstruction: Secondary | ICD-10-CM | POA: Diagnosis present

## 2017-03-22 DIAGNOSIS — N179 Acute kidney failure, unspecified: Secondary | ICD-10-CM | POA: Diagnosis present

## 2017-03-22 DIAGNOSIS — K449 Diaphragmatic hernia without obstruction or gangrene: Secondary | ICD-10-CM | POA: Diagnosis present

## 2017-03-22 DIAGNOSIS — I10 Essential (primary) hypertension: Secondary | ICD-10-CM | POA: Diagnosis present

## 2017-03-22 DIAGNOSIS — I5033 Acute on chronic diastolic (congestive) heart failure: Secondary | ICD-10-CM | POA: Diagnosis present

## 2017-03-22 DIAGNOSIS — Z7982 Long term (current) use of aspirin: Secondary | ICD-10-CM

## 2017-03-22 DIAGNOSIS — R195 Other fecal abnormalities: Secondary | ICD-10-CM | POA: Diagnosis not present

## 2017-03-22 DIAGNOSIS — R05 Cough: Secondary | ICD-10-CM | POA: Diagnosis not present

## 2017-03-22 DIAGNOSIS — D62 Acute posthemorrhagic anemia: Secondary | ICD-10-CM | POA: Diagnosis not present

## 2017-03-22 DIAGNOSIS — I350 Nonrheumatic aortic (valve) stenosis: Secondary | ICD-10-CM | POA: Diagnosis not present

## 2017-03-22 DIAGNOSIS — E785 Hyperlipidemia, unspecified: Secondary | ICD-10-CM | POA: Diagnosis present

## 2017-03-22 DIAGNOSIS — Z833 Family history of diabetes mellitus: Secondary | ICD-10-CM

## 2017-03-22 DIAGNOSIS — L89621 Pressure ulcer of left heel, stage 1: Secondary | ICD-10-CM | POA: Diagnosis present

## 2017-03-22 DIAGNOSIS — E559 Vitamin D deficiency, unspecified: Secondary | ICD-10-CM | POA: Diagnosis present

## 2017-03-22 DIAGNOSIS — I2510 Atherosclerotic heart disease of native coronary artery without angina pectoris: Secondary | ICD-10-CM | POA: Diagnosis present

## 2017-03-22 DIAGNOSIS — Z841 Family history of disorders of kidney and ureter: Secondary | ICD-10-CM

## 2017-03-22 DIAGNOSIS — J449 Chronic obstructive pulmonary disease, unspecified: Secondary | ICD-10-CM | POA: Diagnosis present

## 2017-03-22 DIAGNOSIS — D509 Iron deficiency anemia, unspecified: Secondary | ICD-10-CM | POA: Diagnosis present

## 2017-03-22 DIAGNOSIS — E119 Type 2 diabetes mellitus without complications: Secondary | ICD-10-CM

## 2017-03-22 DIAGNOSIS — Z7951 Long term (current) use of inhaled steroids: Secondary | ICD-10-CM

## 2017-03-22 DIAGNOSIS — Z79899 Other long term (current) drug therapy: Secondary | ICD-10-CM

## 2017-03-22 DIAGNOSIS — K2961 Other gastritis with bleeding: Secondary | ICD-10-CM | POA: Diagnosis not present

## 2017-03-22 DIAGNOSIS — I130 Hypertensive heart and chronic kidney disease with heart failure and stage 1 through stage 4 chronic kidney disease, or unspecified chronic kidney disease: Secondary | ICD-10-CM | POA: Diagnosis present

## 2017-03-22 DIAGNOSIS — Z01818 Encounter for other preprocedural examination: Secondary | ICD-10-CM

## 2017-03-22 DIAGNOSIS — D696 Thrombocytopenia, unspecified: Secondary | ICD-10-CM | POA: Diagnosis not present

## 2017-03-22 DIAGNOSIS — K921 Melena: Secondary | ICD-10-CM | POA: Diagnosis not present

## 2017-03-22 DIAGNOSIS — Z7722 Contact with and (suspected) exposure to environmental tobacco smoke (acute) (chronic): Secondary | ICD-10-CM | POA: Diagnosis present

## 2017-03-22 DIAGNOSIS — E1122 Type 2 diabetes mellitus with diabetic chronic kidney disease: Secondary | ICD-10-CM | POA: Diagnosis present

## 2017-03-22 DIAGNOSIS — D649 Anemia, unspecified: Secondary | ICD-10-CM | POA: Diagnosis not present

## 2017-03-22 DIAGNOSIS — K2970 Gastritis, unspecified, without bleeding: Secondary | ICD-10-CM

## 2017-03-22 DIAGNOSIS — L8990 Pressure ulcer of unspecified site, unspecified stage: Secondary | ICD-10-CM

## 2017-03-22 DIAGNOSIS — R748 Abnormal levels of other serum enzymes: Secondary | ICD-10-CM | POA: Diagnosis not present

## 2017-03-22 DIAGNOSIS — R059 Cough, unspecified: Secondary | ICD-10-CM

## 2017-03-22 DIAGNOSIS — K2990 Gastroduodenitis, unspecified, without bleeding: Secondary | ICD-10-CM

## 2017-03-22 DIAGNOSIS — I050 Rheumatic mitral stenosis: Secondary | ICD-10-CM | POA: Diagnosis present

## 2017-03-22 DIAGNOSIS — Z794 Long term (current) use of insulin: Secondary | ICD-10-CM

## 2017-03-22 DIAGNOSIS — Z952 Presence of prosthetic heart valve: Secondary | ICD-10-CM

## 2017-03-22 HISTORY — DX: Rheumatic mitral stenosis: I05.0

## 2017-03-22 HISTORY — DX: Presence of prosthetic heart valve: Z95.2

## 2017-03-22 HISTORY — DX: Chronic diastolic (congestive) heart failure: I50.32

## 2017-03-22 LAB — CBC
HEMATOCRIT: 15.9 % — AB (ref 36.0–46.0)
HEMATOCRIT: 27 % — AB (ref 36.0–46.0)
Hemoglobin: 5.4 g/dL — CL (ref 12.0–15.0)
Hemoglobin: 9.2 g/dL — ABNORMAL LOW (ref 12.0–15.0)
MCH: 30.5 pg (ref 26.0–34.0)
MCH: 29.2 pg (ref 26.0–34.0)
MCHC: 34 g/dL (ref 30.0–36.0)
MCHC: 34.1 g/dL (ref 30.0–36.0)
MCV: 89.8 fL (ref 78.0–100.0)
MCV: 85.7 fL (ref 78.0–100.0)
Platelets: 97 10*3/uL — ABNORMAL LOW (ref 150–400)
Platelets: 96 10*3/uL — ABNORMAL LOW (ref 150–400)
RBC: 1.77 MIL/uL — ABNORMAL LOW (ref 3.87–5.11)
RBC: 3.15 MIL/uL — ABNORMAL LOW (ref 3.87–5.11)
RDW: 15.1 % (ref 11.5–15.5)
RDW: 14.8 % (ref 11.5–15.5)
WBC: 3.9 10*3/uL — ABNORMAL LOW (ref 4.0–10.5)
WBC: 5.5 10*3/uL (ref 4.0–10.5)

## 2017-03-22 LAB — POCT I-STAT 7, (LYTES, BLD GAS, ICA,H+H)
Acid-base deficit: 7 mmol/L — ABNORMAL HIGH (ref 0.0–2.0)
Bicarbonate: 17.9 mmol/L — ABNORMAL LOW (ref 20.0–28.0)
Calcium, Ion: 1.36 mmol/L (ref 1.15–1.40)
HCT: 29 % — ABNORMAL LOW (ref 36.0–46.0)
HEMOGLOBIN: 9.9 g/dL — AB (ref 12.0–15.0)
O2 Saturation: 93 %
PCO2 ART: 32.3 mmHg (ref 32.0–48.0)
PO2 ART: 68 mmHg — AB (ref 83.0–108.0)
Potassium: 4.3 mmol/L (ref 3.5–5.1)
Sodium: 141 mmol/L (ref 135–145)
TCO2: 19 mmol/L — ABNORMAL LOW (ref 22–32)
pH, Arterial: 7.35 (ref 7.350–7.450)

## 2017-03-22 LAB — BLOOD GAS, ARTERIAL

## 2017-03-22 LAB — BASIC METABOLIC PANEL
Anion gap: 8 (ref 5–15)
BUN: 121 mg/dL — AB (ref 6–20)
CALCIUM: 9.6 mg/dL (ref 8.9–10.3)
CHLORIDE: 113 mmol/L — AB (ref 101–111)
CO2: 19 mmol/L — AB (ref 22–32)
CREATININE: 3.02 mg/dL — AB (ref 0.44–1.00)
GFR calc non Af Amer: 15 mL/min — ABNORMAL LOW (ref >60)
GFR, EST AFRICAN AMERICAN: 17 mL/min — AB (ref >60)
Glucose, Bld: 78 mg/dL (ref 65–99)
Potassium: 4.1 mmol/L (ref 3.5–5.1)
Sodium: 140 mmol/L (ref 135–145)

## 2017-03-22 LAB — GLUCOSE, CAPILLARY
GLUCOSE-CAPILLARY: 139 mg/dL — AB (ref 65–99)
GLUCOSE-CAPILLARY: 59 mg/dL — AB (ref 65–99)
GLUCOSE-CAPILLARY: 156 mg/dL — AB (ref 65–99)
Glucose-Capillary: 85 mg/dL (ref 65–99)
Glucose-Capillary: 95 mg/dL (ref 65–99)

## 2017-03-22 LAB — PROTIME-INR
INR: 1.09
Prothrombin Time: 14.1 seconds (ref 11.4–15.2)

## 2017-03-22 LAB — PREPARE RBC (CROSSMATCH)

## 2017-03-22 SURGERY — IMPLANTATION, AORTIC VALVE, TRANSCATHETER, FEMORAL APPROACH
Anesthesia: General | Site: Chest

## 2017-03-22 MED ORDER — SODIUM CHLORIDE 0.9 % IV SOLN: 250.0000 mL | INTRAVENOUS | Status: DC | PRN

## 2017-03-22 MED ORDER — CHLORHEXIDINE GLUCONATE 4 % EX LIQD: 30.0000 mL | CUTANEOUS | Status: DC

## 2017-03-22 MED ORDER — FUROSEMIDE 10 MG/ML IJ SOLN
40.0000 mg | INTRAMUSCULAR | Status: DC
  Filled 2017-03-22: qty 4

## 2017-03-22 MED ORDER — CHLORHEXIDINE GLUCONATE 0.12 % MT SOLN
15.0000 mL | Freq: Once | OROMUCOSAL | Status: AC
  Administered 2017-03-22: 15 mL via OROMUCOSAL
  Filled 2017-03-22: qty 15

## 2017-03-22 MED ORDER — CHLORHEXIDINE GLUCONATE 4 % EX LIQD: 60.0000 mL | Freq: Once | CUTANEOUS | Status: DC

## 2017-03-22 MED ORDER — MONTELUKAST SODIUM 10 MG PO TABS
10.0000 mg | ORAL_TABLET | Freq: Every day | ORAL | Status: DC
  Administered 2017-03-22 – 2017-03-28 (×7): 10 mg via ORAL
  Filled 2017-03-22 (×7): qty 1

## 2017-03-22 MED ORDER — ORAL CARE MOUTH RINSE
15.0000 mL | Freq: Two times a day (BID) | OROMUCOSAL | Status: DC
  Administered 2017-03-22 – 2017-03-28 (×9): 15 mL via OROMUCOSAL

## 2017-03-22 MED ORDER — MOMETASONE FURO-FORMOTEROL FUM 200-5 MCG/ACT IN AERO
2.0000 | INHALATION_SPRAY | Freq: Two times a day (BID) | RESPIRATORY_TRACT | Status: DC
  Administered 2017-03-23 – 2017-03-29 (×9): 2 via RESPIRATORY_TRACT
  Filled 2017-03-22 (×4): qty 8.8

## 2017-03-22 MED ORDER — SODIUM CHLORIDE 0.9% FLUSH
3.0000 mL | INTRAVENOUS | Status: DC | PRN
  Filled 2017-03-22: qty 3

## 2017-03-22 MED ORDER — ACETAMINOPHEN 500 MG PO TABS: 500.0000 mg | ORAL_TABLET | Freq: Four times a day (QID) | ORAL | Status: DC | PRN

## 2017-03-22 MED ORDER — FUROSEMIDE 10 MG/ML IJ SOLN
40.0000 mg | INTRAMUSCULAR | Status: AC
  Administered 2017-03-22: 40 mg via INTRAVENOUS

## 2017-03-22 MED ORDER — FENTANYL CITRATE (PF) 250 MCG/5ML IJ SOLN
INTRAMUSCULAR | Status: AC
  Filled 2017-03-22: qty 5

## 2017-03-22 MED ORDER — ONDANSETRON HCL 4 MG/2ML IJ SOLN
4.0000 mg | Freq: Four times a day (QID) | INTRAMUSCULAR | Status: DC | PRN
  Administered 2017-03-27: 4 mg via INTRAVENOUS
  Filled 2017-03-22 (×2): qty 2

## 2017-03-22 MED ORDER — MIDAZOLAM HCL 2 MG/2ML IJ SOLN
INTRAMUSCULAR | Status: AC
  Filled 2017-03-22: qty 2

## 2017-03-22 MED ORDER — DOCUSATE SODIUM 100 MG PO CAPS
100.0000 mg | ORAL_CAPSULE | Freq: Two times a day (BID) | ORAL | Status: DC | PRN
  Administered 2017-03-23: 100 mg via ORAL
  Filled 2017-03-22 (×2): qty 1

## 2017-03-22 MED ORDER — SODIUM CHLORIDE 0.9 % IV SOLN
Freq: Once | INTRAVENOUS | Status: AC
  Administered 2017-03-22: 11:00:00 via INTRAVENOUS

## 2017-03-22 MED ORDER — FERROUS SULFATE 325 (65 FE) MG PO TABS
325.0000 mg | ORAL_TABLET | Freq: Two times a day (BID) | ORAL | Status: DC
  Administered 2017-03-22 – 2017-03-28 (×13): 325 mg via ORAL
  Filled 2017-03-22 (×14): qty 1

## 2017-03-22 MED ORDER — SODIUM CHLORIDE 0.9% FLUSH
3.0000 mL | Freq: Two times a day (BID) | INTRAVENOUS | Status: DC
  Administered 2017-03-22 – 2017-03-29 (×13): 3 mL via INTRAVENOUS

## 2017-03-22 MED ORDER — PREMIER PROTEIN SHAKE
11.0000 [oz_av] | Freq: Three times a day (TID) | ORAL | Status: DC
  Administered 2017-03-22 – 2017-03-28 (×10): 11 [oz_av] via ORAL
  Filled 2017-03-22 (×25): qty 325.31

## 2017-03-22 MED ORDER — ALBUTEROL SULFATE (2.5 MG/3ML) 0.083% IN NEBU
2.5000 mg | INHALATION_SOLUTION | Freq: Four times a day (QID) | RESPIRATORY_TRACT | Status: DC | PRN
  Administered 2017-03-23: 2.5 mg via RESPIRATORY_TRACT
  Filled 2017-03-22: qty 3

## 2017-03-22 MED ORDER — LORATADINE 10 MG PO TABS
10.0000 mg | ORAL_TABLET | Freq: Every day | ORAL | Status: DC
  Administered 2017-03-22 – 2017-03-29 (×8): 10 mg via ORAL
  Filled 2017-03-22 (×8): qty 1

## 2017-03-22 MED ORDER — SODIUM CHLORIDE 0.9 % IV SOLN
INTRAVENOUS | Status: DC | PRN
  Administered 2017-03-29: 13:00:00 via INTRA_ARTERIAL

## 2017-03-22 MED ORDER — INSULIN ASPART 100 UNIT/ML ~~LOC~~ SOLN
0.0000 [IU] | Freq: Three times a day (TID) | SUBCUTANEOUS | Status: DC
  Administered 2017-03-23: 5 [IU] via SUBCUTANEOUS
  Administered 2017-03-23: 15 [IU] via SUBCUTANEOUS
  Administered 2017-03-24: 8 [IU] via SUBCUTANEOUS
  Administered 2017-03-24: 3 [IU] via SUBCUTANEOUS
  Administered 2017-03-24: 11 [IU] via SUBCUTANEOUS
  Administered 2017-03-25: 3 [IU] via SUBCUTANEOUS
  Administered 2017-03-25: 11 [IU] via SUBCUTANEOUS
  Administered 2017-03-25: 8 [IU] via SUBCUTANEOUS
  Administered 2017-03-26 (×2): 11 [IU] via SUBCUTANEOUS
  Administered 2017-03-26 – 2017-03-27 (×2): 3 [IU] via SUBCUTANEOUS
  Administered 2017-03-27: 2 [IU] via SUBCUTANEOUS
  Administered 2017-03-27: 5 [IU] via SUBCUTANEOUS
  Administered 2017-03-28: 8 [IU] via SUBCUTANEOUS
  Administered 2017-03-28: 15 [IU] via SUBCUTANEOUS

## 2017-03-22 MED ORDER — LUBIPROSTONE 8 MCG PO CAPS
8.0000 ug | ORAL_CAPSULE | Freq: Two times a day (BID) | ORAL | Status: DC | PRN
  Filled 2017-03-22: qty 1

## 2017-03-22 MED ORDER — FENTANYL CITRATE (PF) 100 MCG/2ML IJ SOLN
INTRAMUSCULAR | Status: AC
  Filled 2017-03-22: qty 2

## 2017-03-22 MED ORDER — ATORVASTATIN CALCIUM 20 MG PO TABS
20.0000 mg | ORAL_TABLET | Freq: Every day | ORAL | Status: DC
  Administered 2017-03-22 – 2017-03-28 (×7): 20 mg via ORAL
  Filled 2017-03-22 (×8): qty 1

## 2017-03-22 MED ORDER — SODIUM CHLORIDE 0.9 % IV SOLN: 10.0000 mL/h | Freq: Once | INTRAVENOUS | Status: DC

## 2017-03-22 NOTE — Anesthesia Preprocedure Evaluation (Addendum)
Anesthesia Evaluation  Patient identified by MRN, date of birth, ID band Patient awake    Reviewed: Allergy & Precautions, H&P , NPO status , Patient's Chart, lab work & pertinent test results  History of Anesthesia Complications Negative for: history of anesthetic complications  Airway Mallampati: II  TM Distance: >3 FB Neck ROM: Full    Dental  (+) Edentulous Upper, Edentulous Lower   Pulmonary asthma , COPD,  COPD inhaler,    breath sounds clear to auscultation       Cardiovascular Exercise Tolerance: Good hypertension, Pt. on medications + CAD and +CHF  + Valvular Problems/Murmurs AS  Rhythm:Regular Rate:Normal + Systolic murmurs    Neuro/Psych negative neurological ROS  negative psych ROS   GI/Hepatic negative GI ROS, Neg liver ROS,   Endo/Other  diabetes, Type 2  Renal/GU Renal Insufficiency and CRFRenal disease     Musculoskeletal  (+) Arthritis ,   Abdominal   Peds  Hematology  (+) anemia ,   Anesthesia Other Findings Pt is a 71 year old female scheduled for TAVR, transfemoral approach, possible left trans-carotid approach on 03/22/17 by [name], MD and Darylene Price, MD. She is s/sp multiple extractions with alveoloplasty, preprostatic surgery as needed on 03/14/17, but required admission (03/14/17-03/16/17) due to AKI (acute on chronic KD) and anemia (requiring 1 Unit PRBC). Losartan and Lasix held. Nell Range, PA (TAVR team) saw patient while at PAT 03/18/17 and was to review PAT labs to ensure felt stable for 03/22/17 surgery.  - PCP is Michell Heinrich, DO - Primary cardiologist is Delanna Notice, MD [place], [place]) - Nephrologist is Lance Coon, Brooke Bonito MD [facility], [place], [place])  PMH includes:CAD(70% CX, medically managed),severeaortic stenosis,severe mitral stenosis (medically managed due to co-morbidities),HTN, hyperlipidemia, asthma, CKD (stage IV), DM2, COPD,  anemia, gallbladder disease (appears was scheduled for surgery 11/2016, but was cancelled until cleared by cardiology). Never smoker (passive smoking exposure). BMI 22.  Medications include: albuterol, amlodipine, ASA 81 mg, Lipitor, Symbicort, ferrous sulfate, Norco, Atrovent, Lantus, Amitiza, Singulair.  Pulse 98   Temp 37.1 C   Resp 18   Ht 4\' 10"  (1.473 m)   Wt 105 lb 1.6 oz (47.7 kg)   SpO2 95%   BMI 21.97 kg/m  It appears BP was not recorded at PAT.  EKG 03/18/17:NSR at 97 bpm. Moderate voltage criteria for LVH, may be normal variant.   Echo 03/07/17: - Left ventricle: The cavity size was normal. There was moderateconcentric hypertrophy. Systolic function was vigorous. Theestimated ejection fraction was in the range of 65% to 70%. Wall motion was normal; there were no regional wall motionabnormalities. Doppler parameters are consistent with abnormalleft ventricular relaxation (grade 1 diastolic dysfunction).Doppler parameters are consistent with both elevated ventricularend-diastolic filling pressure and elevated left atrial fillingpressure. - Aortic valve: Trileaflet; severely thickened, severely calcifiedleaflets. Valve mobility was restricted. There was trivialregurgitation. Mean gradient (S): 91 mm Hg. Peak gradient (S):131 mm Hg. - Ascending aorta: The ascending aorta was normal in size. - Mitral valve: Calcified annulus. Severely thickened, severelycalcified leaflets . Mobility was restricted. The findings areconsistent with severe stenosis. There was mild regurgitation.Peak gradient (D): 23 mm Hg. - Left atrium: The atrium was severely dilated. - Right ventricle: The cavity size was normal. Wall thickness wasnormal. Systolic function was normal. - Pulmonary arteries: Systolic pressure was within the normalrange. - Inferior vena cava: The vessel was normal in size. -Impressions: Normal LVEF. Severely elevated LVEDP.Critical aortic stenosis with peak/mean  transaortic gradients131/91 mmHg.Severe mitral stenosis with mean  gradient 11 mmHg.  R/L cardiac cath 02/09/17:  Mid RCA to Dist RCA lesion, 40 %stenosed.  Prox RCA to Mid RCA lesion, 20 %stenosed.  Ost Cx to Prox Cx lesion, 70 %stenosed.  Ost 3rd Mrg to 3rd Mrg lesion, 40 %stenosed.  Prox LAD to Mid LAD lesion, 30 %stenosed.  Dist LAD lesion, 20 %stenosed.  Ost 1st Diag lesion, 40 %stenosed.  Ost 2nd Diag to 2nd Diag lesion, 30 %stenosed.  There is severe aortic valve stenosis. 1. Single vessel CAD with moderately severe, calcified stenosis in the proximal Circumflex artery 2. Mild non-obstructive disease in the RCA and LAD 3. Severe aortic stenosis (mean gradient 18.18mmHg, peak gradient 35 mmHg, AVA 1.26 cm2 ).  CT Coronary 03/01/17: IMPRESSION: 1) Calcified tri leaflet aortic valve with annular area of 396 mm2 suitable for a 23 mm Sapien 3 valve and perimeter of 72.7 mm suitable for a 26 mm Evolut Pro valve 2) Optimum angiographic angle for deployment LAO 17 degrees Caudal 9 degrees 3) Coronary arteries sufficient height above annulus for deployment 4) Normal aortic root and arch vessels with moderate calcific atherosclerosis 5) Significant nodular calcium at base of left annular sinus extending into the intervalvular fibrosa. Also appears to be significant thickening and restriction to motion of mitral valve suggest echo correlation 6) Moderate LAE with no LAA thrombus  Carotid duplex 03/01/17:  -Right Carotid: There is evidence in the right ICA of a 1-39% stenosis. -Left Carotid: There is evidence in the left ICA of a 1-39% stenosis. -Vertebrals: Both vertebral arteries were patent with antegrade flow.  CTA chest/abd/pelvis 03/01/17: IMPRESSION: 1. Vascular findings and measurements pertinent to potential TAVR procedure, as detailed above (see full report). 2. Severe thickening and calcification of the aortic valve, compatible with the reported clinical  history of severe aortic stenosis. 3. Aortic Atherosclerosis (ICD10-I70.0). Left main and 3 vessel coronary atherosclerosis. Mild cardiomegaly. 4. 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 Follow-up adrenal protocol CT abdomen without and with IV contrast could be considered in 12 months. This recommendation follows ACR consensus guidelines: Management of Incidental Adrenal Masses: A White Paper of the ACR Incidental Findings Committee. J Am Coll Radiol 2017;14:1038-1044. 5. Pneumobilia, correlate for any history of sphincterotomy. Mild common bile duct dilation (11 mm diameter) without intrahepatic dilation. No radiopaque cholelithiasis or choledocholithiasis. Recommend correlation with serum bilirubin levels. MRI abdomen with MRCP without and with IV contrast could be considered for further evaluation as clinically warranted. 6. Additional chronic findings as detailed (see full report).  CXR 03/18/17: IMPRESSION: Mild cardiomegaly. No active cardiopulmonary disease.  PFTs 03/07/17: FVC 1.43 (62%), FEV1 0.89 (51%), DLCO unc 7.96 (49%).  Preoperative labs noted. Random glucose 249. A1c 7.4. BUN 83, Cr 2.39, stable when compared to labs from earlier this month (BUN 76-98, Cr 2.45-2.72), but up since 01/2017 (BUN ~ 50, Cr 1.59-1.86). BNP 182. H/H 9.4/29.0 (stable since 03/15/17), PLT 101K. PT/PTT WNL. Due to recent transfusion, she needs T&S on the day of surgery.  She needs an ABG on the day of surgery.    Reproductive/Obstetrics negative OB ROS                             Anesthesia Physical Anesthesia Plan  ASA: IV  Anesthesia Plan: General   Post-op Pain Management:    Induction: Intravenous  PONV Risk Score and Plan: 3 and Ondansetron and Dexamethasone  Airway Management Planned: Oral ETT  Additional Equipment: Arterial line, CVP and TEE  Intra-op Plan:   Post-operative Plan: Possible Post-op  intubation/ventilation  Informed Consent: I have reviewed the patients History and Physical, chart, labs and discussed the procedure including the risks, benefits and alternatives for the proposed anesthesia with the patient or authorized representative who has indicated his/her understanding and acceptance.   Dental advisory given  Plan Discussed with: CRNA and Surgeon  Anesthesia Plan Comments:        Anesthesia Quick Evaluation

## 2017-03-22 NOTE — Progress Notes (Signed)
CRITICAL VALUE ALERT  Critical Value:  Hemoglobin  Date & Time Notied:  03/22/2017 1235  Provider Notified: C. Ermalene Postin, MD 03/22/2017 1238  Orders Received/Actions taken: Aware will transfuse blood when ready.

## 2017-03-22 NOTE — Interval H&P Note (Signed)
History and Physical Interval Note:  03/22/2017 12:12 PM  Sonya Carpenter  has presented today for surgery, with the diagnosis of severe aortic stenosis  The various methods of treatment have been discussed with the patient and family. After consideration of risks, benefits and other options for treatment, the patient has consented to  Procedure(s): TRANSCATHETER AORTIC VALVE REPLACEMENT, TRANSFEMORAL (N/A) TRANSESOPHAGEAL ECHOCARDIOGRAM (TEE) (N/A) Possible, TRANSCATHETER AORTIC VALVE REPLACEMENT,CAROTID (N/A) as a surgical intervention .  The patient's history has been reviewed, patient examined, no change in status, stable for surgery.  I have reviewed the patient's chart and labs.  Questions were answered to the patient's satisfaction.     Rexene Alberts

## 2017-03-22 NOTE — Anesthesia Procedure Notes (Addendum)
Central Venous Catheter Insertion Performed by: Oleta Mouse, MD, anesthesiologist Start/End11/27/2018 1:12 PM, 03/22/2017 1:20 PM Patient location: Pre-op. Preanesthetic checklist: patient identified, IV checked, site marked, risks and benefits discussed, surgical consent, monitors and equipment checked, pre-op evaluation, timeout performed and anesthesia consent Lidocaine 1% used for infiltration and patient sedated Hand hygiene performed  and maximum sterile barriers used  Catheter size: 8 Fr Total catheter length 16. Central line was placed.Double lumen Procedure performed using ultrasound guided technique. Ultrasound Notes:anatomy identified, needle tip was noted to be adjacent to the nerve/plexus identified, no ultrasound evidence of intravascular and/or intraneural injection and image(s) printed for medical record Attempts: 1 Following insertion, dressing applied, line sutured and Biopatch. Post procedure assessment: blood return through all ports, free fluid flow and no air  Patient tolerated the procedure well with no immediate complications.

## 2017-03-22 NOTE — Progress Notes (Addendum)
TCTS BRIEF SICU PROGRESS NOTE  Day of Surgery  S/P Procedure(s) (LRB): TRANSCATHETER AORTIC VALVE REPLACEMENT, TRANSFEMORAL (N/A) TRANSESOPHAGEAL ECHOCARDIOGRAM (TEE) (N/A) Possible, TRANSCATHETER AORTIC VALVE REPLACEMENT,CAROTID (N/A)   Repeat CBC obtained this morning on arrival reveals significant drop in Hgb/Hct now 5.4/15.9 down from 9.4/29.0 4 days previously.  Patient had dental extraction on 11/19 and reports significant bleeding from her mouth this past weekend, during which time she was "spitting up blood and clots".  But no further bleeding yesterday.  Plan: Will check renal function and transfuse 2 units PRBCs, then decide whether or not to proceed with TAVR later this afternoon depending on how she is doing.  Discussed w/ patient and her family at bedside.  Rexene Alberts, MD 03/22/2017 12:54 PM

## 2017-03-22 NOTE — H&P (Signed)
[facility]   Cardiology Admission History and Physical:   Patient ID: Sonya Carpenter; [id number]; 1945-12-30   Admission date: 03/22/2017  Primary Care Provider: Michell Heinrich, DO Primary Cardiologist: Dr. Alroy Dust [facility], [place]) / Dr. Burt Knack & Dr. Roxy Manns (TAVR)  Patient Profile:   Sonya Carpenter is a 71 y.o. 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 The patient appeared weak and pale prior to surgery so STAT labs were ordered which revealed acute blood loss anemia and AKI. She will be admitted for further observation and work up.   History of Present Illness:    Sonya Carpenter has known of the presence of a heart murmur for several years. She was seen approximately 3 years ago by Dr. Rosalita Chessman and echocardiogram reportedly revealed findings consistent with moderate aortic stenosis. Patient was lost to follow-up for a period of time primarily because her husband was in poor health. 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 During that hospitalization she was thought to have a possible abdominal mass with biliary ductal dilitation. She was referred to [facility] in [place] where she was diagnosed withcholedochocholelithiasisby ERCP.She did not have an abdominal mass but she was noted to have some gallstones with intermittent episodes of right upper quadrant abdominal discomfort brought on with large meals. Elective cholecystectomy was planned but preoperative cardiac clearance requested due to  the presence of her aortic stenosis. She was subsequently readmitted to the hospital in [place] in September with shortness of breath associated with large right pleural effusion requiring thoracentesis. She was treated for possible pneumonia. Transthoracic echocardiogram revealed severe aortic stenosis with peak and mean transvalvular gradients reported 121 and 69 mmHg, respectively.Left ventricular systolic function remains normal with ejection fraction estimated 65%. She was referred to Dr. Burt Knack for consultation and underwent left and right heart catheterization by Dr. Angelena Form on February 09, 2017. 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 Peak to peak and mean transvalvular gradients across the aortic valve were measured 35 and 18.2 mmHg corresponding to aortic valve area calculated 1.26 cm squared.  She underwent extensive dental extractions last week in anticipation of valve surgery. Pre op labs showed acute anemia with Hg dropping from 12.7--> 8.5 and AKI with creat 1.86--> 2.72. She was admitted 11/19-11/21/18 for blood transfusion, IVFs and observation. FOBT negative. Her hemoglobin and renal function stabilized and she was discharged home with close follow up. Her ARB and lasix were held at discharge.   Pre admission lab work on 11/23 showed stable Hg at 9.4 and creat 2.39.  She presented to [facility] today for planned TAVR. She was noted to appear weak and pale and stat labs were ordered. This revealed a Hg of 5.4 and creat of 3.02. She admitted to significant bleeding from her mouth over the weekend spitting up blood and clots. This subsided yesterday.   TAVR was cancelled. She received 2 U PRBCs and 750 cc IVFs. She developed some dyspnea and was given one dose of IV Lasix. Will plan to admit her overnight to ICU for  close monitoring.    Past Medical History:  Diagnosis Date  .  Anemia   . Aortic stenosis   . Arthritis    back  . Asthma   . CAD (coronary artery disease)   . Chronic diastolic CHF (congestive heart failure) ([place])   . Chronic diastolic congestive heart failure ([place])   . CKD (chronic kidney disease), stage IV ([place])   . COPD (chronic obstructive pulmonary disease) ([place])   . Diabetes mellitus without complication ([place])    type 2  . Gallbladder disease   . Headache   . Heart murmur   . Hyperlipidemia   . Hypertension   . Mitral stenosis   . Pneumonia   . Vitamin D deficiency     Past Surgical History:  Procedure Laterality Date  . BREAST CYST EXCISION Right   . COLONSCOPY    . ELBOW SURGERY Left   . GALLBLADDER SURGERY     has not had yet   . KNEE SURGERY    . MULTIPLE EXTRACTIONS WITH ALVEOLOPLASTY N/A 03/14/2017   Procedure: Extraction of tooth #'s 2-11, 15, 19-28 and 31 with alveoloplasty, bilateral mandibular tori reductions and bilateral mandibular lingual exostoses reductions.;  Surgeon: Lenn Cal, DDS;  Location: [facility];  Service: Oral Surgery;  Laterality: N/A;  . REFRACTIVE SURGERY    . RIGHT/LEFT HEART CATH AND CORONARY ANGIOGRAPHY N/A 02/09/2017   Procedure: RIGHT/LEFT HEART CATH AND CORONARY ANGIOGRAPHY;  Surgeon: Burnell Blanks, MD;  Location: [facility];  Service: Cardiovascular;  Laterality: N/A;  . TONSILLECTOMY       Medications Prior to Admission: Prior to Admission medications   Medication Sig Start Date End Date Taking? Authorizing Provider  acetaminophen (TYLENOL) 500 MG tablet Take 500 mg by mouth every 6 (six) hours as needed for mild pain or moderate pain.   Yes [provider]  albuterol (PROVENTIL HFA;VENTOLIN HFA) 108 (90 Base) MCG/ACT inhaler Inhale 2 puffs every 6 (six) hours as needed into the lungs for wheezing or shortness of breath.    Yes [provider]  albuterol (PROVENTIL) (2.5 MG/3ML) 0.083% nebulizer solution Take 2.5 mg 3 (three) times daily as needed by  nebulization for wheezing or shortness of breath.   Yes [provider]  amLODipine (NORVASC) 10 MG tablet Take 1 tablet (10 mg total) by mouth daily. 03/16/17  Yes Eileen Stanford, PA-C  atorvastatin (LIPITOR) 20 MG tablet Take 20 mg by mouth daily. 01/31/17  Yes [provider]  budesonide-formoterol (SYMBICORT) 160-4.5 MCG/ACT inhaler Inhale 2 puffs 2 (two) times daily as needed into the lungs (for shortness of breath).    Yes [provider]  cetirizine (ZYRTEC) 10 MG tablet Take 10 mg at bedtime by mouth.    Yes [provider]  chlorhexidine (PERIDEX) 0.12 % solution Use as directed 15 mLs in the mouth or throat 2 (two) times daily. 03/16/17  Yes Eileen Stanford, PA-C  docusate sodium (COLACE) 100 MG capsule Take 100 mg 2 (two) times daily as needed by mouth for mild constipation.   Yes [provider]  ferrous sulfate 325 (65 FE) MG EC tablet Take 325 mg by mouth 2 (two) times daily.    Yes [provider]  HYDROcodone-acetaminophen (NORCO/VICODIN) 5-325 MG tablet Take 1-2 tablets by mouth every 6 (six) hours as needed for moderate pain. 03/16/17  Yes Angelena Form R, PA-C  ipratropium (ATROVENT) 0.02 % nebulizer solution Take 0.5 mg by nebulization 2 (two) times daily.  Yes [provider]  LANTUS SOLOSTAR 100 UNIT/ML Solostar Pen Inject 30 Units every morning into the skin.  10/29/16  Yes [provider]  lubiprostone (AMITIZA) 8 MCG capsule Take 8 mcg 2 (two) times daily as needed by mouth for constipation.    Yes [provider]  montelukast (SINGULAIR) 10 MG tablet Take 10 mg by mouth at bedtime.   Yes [provider]  Multiple Vitamins-Minerals (MULTIVITAMIN WITH MINERALS) tablet Take 1 tablet by mouth daily.   Yes [provider]  Naphazoline HCl (CLEAR EYES OP) Place 2 drops 2 (two) times daily into both eyes.   Yes [provider]  protein supplement shake (PREMIER  PROTEIN) LIQD Take 325 mLs (11 oz total) by mouth 3 (three) times daily between meals. 03/16/17  Yes Eileen Stanford, PA-C  sodium chloride (OCEAN) 0.65 % SOLN nasal spray Place 2 sprays 2 (two) times daily as needed into both nostrils for congestion.    Yes [provider]  aspirin EC 81 MG tablet Take 81 mg by mouth daily.    [provider]  Dextran 70-Hypromellose (ARTIFICIAL TEARS) 0.1-0.3 % SOLN Place 2 drops as needed into both eyes (for dry eyes).     [provider]     Allergies:   No Known Allergies  Social History:   Social History   Socioeconomic History  . Marital status: Widowed    Spouse name: Not on file  . Number of children: 0  . Years of education: Not on file  . Highest education level: Not on file  Social Needs  . Financial resource strain: Not on file  . Food insecurity - worry: Not on file  . Food insecurity - inability: Not on file  . Transportation needs - medical: Not on file  . Transportation needs - non-medical: Not on file  Occupational History  . Not on file  Tobacco Use  . Smoking status: Passive Smoke Exposure - Never Smoker  . Smokeless tobacco: Never Used  Substance and Sexual Activity  . Alcohol use: No    Frequency: Never  . Drug use: No  . Sexual activity: No  Other Topics Concern  . Not on file  Social History Narrative   Here with Uncle and [place].    Family History:   The patient's family history includes Cancer in her sister; Diabetes in her father; Heart disease in her father and mother; Hypertension in her father and mother; Kidney failure in her mother.    ROS:  Please see the history of present illness.  All other ROS reviewed and negative.     Physical Exam/Data:   Vitals:   03/22/17 1313 03/22/17 1314 03/22/17 1315 03/22/17 1316  BP:    (!) 106/51  Pulse: 81 81 83 80  Resp: 15 15 14 11   Temp:      TempSrc:      SpO2: 96% 96% 100% 100%  Weight:        Intake/Output Summary (Last 24  hours) at 03/22/2017 1432 Last data filed at 03/22/2017 1352 Gross per 24 hour  Intake 630 ml  Output -  Net 630 ml   Filed Weights   03/22/17 1011  Weight: 105 lb (47.6 kg)   Body mass index is 21.95 kg/m.  General: weak and pale appearing. HEENT: normal Lymph: no adenopathy Neck: no JVD Endocrine:  No thryomegaly Vascular: No carotid bruits; FA pulses 2+ bilaterally without bruits  Cardiac:  normal S1, S2; RRR; 4/6  SEM @ RUSB Lungs:  clear to auscultation bilaterally, no wheezing, rhonchi or rales  Abd: soft, nontender, no hepatomegaly  Ext: no edema Musculoskeletal:  No deformities, BUE and BLE strength normal and equal Skin: warm and dry, Extensive ecchymosis and hematoma around jaw and neck that is improving.  Neuro:  CNs 2-12 intact, no focal abnormalities noted Psych:  Normal affect    Relevant CV Studies: 2D ECHO: 03/07/2017 Study Conclusions - Left ventricle: The cavity size was normal. There was moderate concentric hypertrophy. Systolic function was vigorous. The estimated ejection fraction was in the Carpenter of 65% to 70%. Wall motion was normal; there were no regional wall motion abnormalities. Doppler parameters are consistent with abnormal left ventricular relaxation (grade 1 diastolic dysfunction). Doppler parameters are consistent with both elevated ventricular end-diastolic filling pressure and elevated left atrial filling pressure. - Aortic valve: Trileaflet; severely thickened, severely calcified leaflets. Valve mobility was restricted. There was trivial regurgitation. Mean gradient (S): 91 mm Hg. Peak gradient (S): 131 mm Hg. - Ascending aorta: The ascending aorta was normal in size. - Mitral valve: Calcified annulus. Severely thickened, severely calcified leaflets . Mobility was restricted. The findings are consistent with severe stenosis. There was mild regurgitation. Peak gradient (D): 23 mm Hg. - Left atrium: The  atrium was severely dilated. - Right ventricle: The cavity size was normal. Wall thickness was normal. Systolic function was normal. - Pulmonary arteries: Systolic pressure was within the normal Carpenter. - Inferior vena cava: The vessel was normal in size. Impressions: - Normal LVEF. Severely elevated LVEDP. Critical aortic stenosis with peak/mean transaortic gradients 131/91 mmHg. Severe mitral stenosis with mean gradient 11 mmHg.  R/LHC 02/09/17 Conclusion    Mid RCA to Dist RCA lesion, 40 %stenosed.  Prox RCA to Mid RCA lesion, 20 %stenosed.  Ost Cx to Prox Cx lesion, 70 %stenosed.  Ost 3rd Mrg to 3rd Mrg lesion, 40 %stenosed.  Prox LAD to Mid LAD lesion, 30 %stenosed.  Dist LAD lesion, 20 %stenosed.  Ost 1st Diag lesion, 40 %stenosed.  Ost 2nd Diag to 2nd Diag lesion, 30 %stenosed.  There is severe aortic valve stenosis.  1. Single vessel CAD with moderately severe, calcified stenosis in the proximal Circumflex artery 2. Mild non-obstructive disease in the RCA and LAD 3. Severe aortic stenosis (mean gradient 18.70mmHg, peak gradient 35 mmHg, AVA 1.26 cm2 ).  Recommendations: Will review films with the TAVR team. We may be able to manage her CAD medically. Will continue workup for TAVR.       Laboratory Data:  Chemistry Recent Labs  Lab 03/18/17 1239 03/22/17 1207 03/22/17 1251  NA 140 141 140  K 3.6 4.3 4.1  CL 111  --  113*  CO2 18*  --  19*  GLUCOSE 249*  --  78  BUN 83*  --  121*  CREATININE 2.39*  --  3.02*  CALCIUM 10.0  --  9.6  GFRNONAA 19*  --  15*  GFRAA 23*  --  17*  ANIONGAP 11  --  8    Recent Labs  Lab 03/18/17 1239  PROT 6.8  ALBUMIN 3.3*  AST 24  ALT 11*  ALKPHOS 88  BILITOT 0.7   Hematology Recent Labs  Lab 03/18/17 1239 03/22/17 1204 03/22/17 1207  WBC 5.1 3.9*  --   RBC 3.19* 1.77*  --   HGB 9.4* 5.4* 9.9*  HCT 29.0* 15.9* 29.0*  MCV 90.9 89.8  --   MCH 29.5 30.5  --  MCHC 32.4 34.0  --   RDW 14.4  15.1  --   PLT 101* 97*  --    Cardiac EnzymesNo results for input(s): TROPONINI in the last 168 hours. No results for input(s): TROPIPOC in the last 168 hours.  BNP Recent Labs  Lab 03/18/17 1240  BNP 182.0*    DDimer No results for input(s): DDIMER in the last 168 hours.  Radiology/Studies:  No results found.  Assessment and Plan:   Sonya Carpenter is a 71 y.o. 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 The patient appeared weak and pale prior to surgery so STAT labs were ordered which revealed acute blood loss anemia (Hg 5.4) and AKI (creat up to 3). She will be admitted for further observation and work up.   Acute blood loss anemia: Hg 9.4 --> 5.4. Likely 2/2 dental extractions with significant blood loss over the weekend. No further blood loss since yesterday. She has received 2 U PRBCS. Will repeat labs this afternoon and transfuse as necessary. We may want to think about Faraheme infusion.   Acute on chronic diastolic CHF: given one dose of IV lasix 40mg . Continue to monitor for volume overload.   AKI: creat 2.39 --> 3.02. She was treated with almost 1 L of fluids. Now stopped 2/2 volume overload. Continue to monitor. Holding all nephrotxic agents.   Severe AS: TAVR cancelled. We will tentatively plan to do surgery next Tuesday if she remains stable   DMT2: will treat with SSI   Severity of Illness: The appropriate patient status for this patient is INPATIENT. Inpatient status is judged to be reasonable and necessary in order to provide the required intensity of service to ensure the patient's safety. The patient's presenting symptoms, physical exam findings, and initial radiographic and laboratory data in the context of their chronic  comorbidities is felt to place them at high risk for further clinical deterioration. Furthermore, it is not anticipated that the patient will be medically stable for discharge from the hospital within 2 midnights of admission. The following factors support the patient status of inpatient.   " The patient's presenting symptoms include weakness and lethargy. " The worrisome physical exam findings include pallor and frailty. " The initial radiographic and laboratory data are worrisome because of acute anemia with hg of 5.4 and AKI with creat of 3 " The chronic co-morbidities include severe AS, diabetes, HTN.   * I certify that at the point of admission it is my clinical judgment that the patient will require inpatient hospital care spanning beyond 2 midnights from the point of admission due to high intensity of service, high risk for further deterioration and high frequency of surveillance required.*    Signed, Angelena Form, PA-C  03/22/2017 2:32 PM   Patient seen, examined. Available data reviewed. Agree with findings, assessment, and plan as outlined by Sonya Range, PA-C. The patient is independently interviewed and examined. On my exam she is alert, oriented, and pale-appearing. Lungs are clear, JVP is not elevated, and heart is RRR with a 4/6 late peaking harsh systolic murmur heard throughout the precordium. There is no diastolic murmur. Abdomen is soft, NT. Extremities have no edema.   Labs are reviewed with pertinent findings as above - HgB 5.4 (down from 9.9) mg/dL, and creatinine 3.02 (up  from 2.39) mg/dL last week. The patient appears to have acute blood loss anemia from extensive dental extraction on background of anemia of chronic disease. The large increase in BUN may in part be due to blood in upper GI tract from oral bleeding. She will be admitted to the ICU, has received 2U PRBC's and is receiving IV lasix for increased shortness of breath likely pulmonary edema/acute on chronic  diastolic HF with PRBC transfusion. A-line and central venous catheters in place for invasive monitoring of her hemodynamics.   Ultimately will need TAVR for treatment of critical AS, but with AKI and acute blood loss anemia, not stable enough to undergo the procedure today. Will follow clinical course and sort out timing of TAVR with the multidisciplinary team.  [name], M.D. 03/22/2017 4:22 PM

## 2017-03-22 NOTE — Plan of Care (Signed)
Patient progressing 

## 2017-03-22 NOTE — Progress Notes (Signed)
Late entry: Notified of CBG of 59. Patient given orange juice and [place] sandwich to eat.

## 2017-03-22 NOTE — Progress Notes (Signed)
TCTS BRIEF SICU PROGRESS NOTE  Day of Surgery  S/P Procedure(s) (LRB): TRANSCATHETER AORTIC VALVE REPLACEMENT, TRANSFEMORAL (N/A) TRANSESOPHAGEAL ECHOCARDIOGRAM (TEE) (N/A) Possible, TRANSCATHETER AORTIC VALVE REPLACEMENT,CAROTID (N/A) CANCELLED PROCEDURE   Reports breathing has improved NSR w/ stable BP O2 sats 98% on 2 L/min Making some urine, amount unclear  Plan: Will check f/u Hgb since transfusion  Rexene Alberts, MD 03/22/2017 5:08 PM

## 2017-03-22 NOTE — Progress Notes (Signed)
Request diet for patient through room service. Awaiting arrival of SCDs from portable equipment.

## 2017-03-23 LAB — GLUCOSE, CAPILLARY
GLUCOSE-CAPILLARY: 116 mg/dL — AB (ref 65–99)
GLUCOSE-CAPILLARY: 385 mg/dL — AB (ref 65–99)
Glucose-Capillary: 273 mg/dL — ABNORMAL HIGH (ref 65–99)

## 2017-03-23 LAB — CBC
HEMATOCRIT: 27.7 % — AB (ref 36.0–46.0)
HEMOGLOBIN: 9.4 g/dL — AB (ref 12.0–15.0)
MCH: 28.8 pg (ref 26.0–34.0)
MCHC: 33.9 g/dL (ref 30.0–36.0)
MCV: 85 fL (ref 78.0–100.0)
Platelets: 109 10*3/uL — ABNORMAL LOW (ref 150–400)
RBC: 3.26 MIL/uL — ABNORMAL LOW (ref 3.87–5.11)
RDW: 15.3 % (ref 11.5–15.5)
WBC: 7.9 10*3/uL (ref 4.0–10.5)

## 2017-03-23 LAB — BASIC METABOLIC PANEL
ANION GAP: 9 (ref 5–15)
BUN: 100 mg/dL — ABNORMAL HIGH (ref 6–20)
CALCIUM: 9.5 mg/dL (ref 8.9–10.3)
CHLORIDE: 111 mmol/L (ref 101–111)
CO2: 21 mmol/L — ABNORMAL LOW (ref 22–32)
CREATININE: 2.6 mg/dL — AB (ref 0.44–1.00)
GFR calc non Af Amer: 18 mL/min — ABNORMAL LOW (ref >60)
GFR, EST AFRICAN AMERICAN: 20 mL/min — AB (ref >60)
Glucose, Bld: 110 mg/dL — ABNORMAL HIGH (ref 65–99)
Potassium: 3.9 mmol/L (ref 3.5–5.1)
SODIUM: 141 mmol/L (ref 135–145)

## 2017-03-23 MED ORDER — GUAIFENESIN 100 MG/5ML PO SOLN
5.0000 mL | ORAL | Status: DC | PRN
  Administered 2017-03-23 – 2017-03-24 (×2): 100 mg via ORAL
  Filled 2017-03-23 (×2): qty 5

## 2017-03-23 MED ORDER — FUROSEMIDE 10 MG/ML IJ SOLN
40.0000 mg | Freq: Once | INTRAMUSCULAR | Status: AC
  Administered 2017-03-23: 40 mg via INTRAVENOUS
  Filled 2017-03-23: qty 4

## 2017-03-23 NOTE — Care Management Note (Addendum)
Case Management Note  Patient Details  Name: Sonya Carpenter MRN: [id number] Date of Birth: March 27, 1946  Subjective/Objective:   From home alone, she has a sister who lives in [place] close by her, she has a rolling walker and a w/chair at home, she uses the walker a lot.  She states she has been to [facility] before and she liked that facility.  She is for TAVR and looks very weak, ABLA hgb down to 5.4, received transfusion.  Will need pt/ot eval.  Most likely will need SNF. Await pt/ot eval.   12/5 Tomi Bamberger RN, BSN - POD 1 TAVR, neo weaned to off, conts on ivf's, received zinacef iv. Due to rising Cr and anemia, TAVR was done on 12/4, Per CSW note SNF denied by insurance ,she left message for MD to do peer to peer.  Patient transferred to SDU.                Action/Plan: NCM will follow for dc needs.   Expected Discharge Date:                  Expected Discharge Plan:     In-House Referral:     Discharge planning Services  CM Consult  Post Acute Care Choice:    Choice offered to:     DME Arranged:    DME Agency:     HH Arranged:    HH Agency:     Status of Service:  In process, will continue to follow  If discussed at Long Length of Stay Meetings, dates discussed:    Additional Comments:  Zenon Mayo, RN 03/23/2017, 2:19 PM

## 2017-03-23 NOTE — Progress Notes (Signed)
[place] VALVE TEAM  Patient Name: Sonya Carpenter Date of Encounter: 03/23/2017  Primary Cardiologist: Dr.Zachary ([facility]ina [facility], [place]) / Dr. Burt Knack & Dr. Roxy Manns (TAVR)   Hospital Problem List     Principal Problem:   Severe aortic stenosis Active Problems:   CKD (chronic kidney disease) stage 4, GFR 15-29 ml/min ([place])   Hypertension   Type 2 diabetes mellitus without complication, with long-term current use of insulin (HCC)   Anemia, iron deficiency   Acute blood loss anemia   Mitral stenosis   Acute on chronic diastolic heart failure (HCC)     Subjective   Feeling a little better today. Still having SOB and cough.   Inpatient Medications    Scheduled Meds: . atorvastatin  20 mg Oral Daily  . ferrous sulfate  325 mg Oral BID  . furosemide  40 mg Intravenous Once  . insulin aspart  0-15 Units Subcutaneous TID WC  . loratadine  10 mg Oral Daily  . mouth rinse  15 mL Mouth Rinse BID  . mometasone-formoterol  2 puff Inhalation BID  . montelukast  10 mg Oral QHS  . protein supplement shake  11 oz Oral TID BM  . sodium chloride flush  3 mL Intravenous Q12H   Continuous Infusions: . sodium chloride    . sodium chloride     PRN Meds: sodium chloride, Place/Maintain arterial line **AND** sodium chloride, acetaminophen, albuterol, docusate sodium, lubiprostone, ondansetron (ZOFRAN) IV, sodium chloride flush   Vital Signs    Vitals:   03/23/17 0737 03/23/17 0800 03/23/17 0837 03/23/17 0900  BP: (!) 114/56 119/62  120/61  Pulse: 97     Resp: 15 (!) 21  (!) 24  Temp:   100.1 F (37.8 C)   TempSrc:   Oral   SpO2: 96% 95%  96%  Weight:      Height:        Intake/Output Summary (Last 24 hours) at 03/23/2017 0954 Last data filed at 03/23/2017 0600 Gross per 24 hour  Intake 650 ml  Output 1600 ml  Net -950 ml   Filed Weights   03/22/17 1011 03/22/17 1654 03/23/17 0428  Weight: 105 lb (47.6 kg) 109 lb 2 oz (49.5 kg) 105 lb  13.1 oz (48 kg)    Physical Exam   General: chronically ill appearing. Coloring improved from yesterday.  HEENT: normal Lymph: no adenopathy Neck: no JVD Endocrine:  No thryomegaly Vascular: No carotid bruits; FA pulses 2+ bilaterally without bruits  Cardiac:  normal S1, S2; RRR; 4/6 SEM @ RUSB Lungs:  clear to auscultation bilaterally, no wheezing, rhonchi or rales  Abd: soft, nontender, no hepatomegaly  Ext: no edema Musculoskeletal:  No deformities, BUE and BLE strength normal and equal Skin: warm and dry, Extensive ecchymosis and hematoma around jaw and neck that is improving.  Neuro:  CNs 2-12 intact, no focal abnormalities noted Psych:  Normal affect     Labs    CBC Recent Labs    03/22/17 1725 03/23/17 0355  WBC 5.5 7.9  HGB 9.2* 9.4*  HCT 27.0* 27.7*  MCV 85.7 85.0  PLT 96* 630*   Basic Metabolic Panel Recent Labs    03/22/17 1251 03/23/17 0355  NA 140 141  K 4.1 3.9  CL 113* 111  CO2 19* 21*  GLUCOSE 78 110*  BUN 121* 100*  CREATININE 3.02* 2.60*  CALCIUM 9.6 9.5   Liver Function Tests No results for input(s): AST, ALT, ALKPHOS, BILITOT,  PROT, ALBUMIN in the last 72 hours. No results for input(s): LIPASE, AMYLASE in the last 72 hours. Cardiac Enzymes No results for input(s): CKTOTAL, CKMB, CKMBINDEX, TROPONINI in the last 72 hours. BNP Invalid input(s): POCBNP D-Dimer No results for input(s): DDIMER in the last 72 hours. Hemoglobin A1C No results for input(s): HGBA1C in the last 72 hours. Fasting Lipid Panel No results for input(s): CHOL, HDL, LDLCALC, TRIG, CHOLHDL, LDLDIRECT in the last 72 hours. Thyroid Function Tests No results for input(s): TSH, T4TOTAL, T3FREE, THYROIDAB in the last 72 hours.  Invalid input(s): FREET3  Telemetry    sinus - Personally Reviewed  ECG   None this admission   Radiology    Dg Chest Port 1 View  Result Date: 03/22/2017 CLINICAL DATA:  Central line placement. EXAM: PORTABLE CHEST 1 VIEW  COMPARISON:  03/18/2017 chest radiograph and prior CTs FINDINGS: Cardiomegaly and pulmonary vascular congestion again noted. A right IJ central venous catheter is present with tip overlying the superior cavoatrial junction. Mild bibasilar atelectasis/airspace disease noted. There is no evidence of pneumothorax. IMPRESSION: Right IJ central venous catheter placement with tip overlying the superior cavoatrial junction. Cardiomegaly, mild pulmonary vascular congestion and mild bibasilar atelectasis/airspace disease. Electronically Signed   By: Margarette Canada M.D.   On: 03/22/2017 17:02    Cardiac Studies   2D ECHO: 03/07/2017 Study Conclusions - Left ventricle: The cavity size was normal. There was moderate concentric hypertrophy. Systolic function was vigorous. The estimated ejection fraction was in the range of 65% to 70%. Wall motion was normal; there were no regional wall motion abnormalities. Doppler parameters are consistent with abnormal left ventricular relaxation (grade 1 diastolic dysfunction). Doppler parameters are consistent with both elevated ventricular end-diastolic filling pressure and elevated left atrial filling pressure. - Aortic valve: Trileaflet; severely thickened, severely calcified leaflets. Valve mobility was restricted. There was trivial regurgitation. Mean gradient (S): 91 mm Hg. Peak gradient (S): 131 mm Hg. - Ascending aorta: The ascending aorta was normal in size. - Mitral valve: Calcified annulus. Severely thickened, severely calcified leaflets . Mobility was restricted. The findings are consistent with severe stenosis. There was mild regurgitation. Peak gradient (D): 23 mm Hg. - Left atrium: The atrium was severely dilated. - Right ventricle: The cavity size was normal. Wall thickness was normal. Systolic function was normal. - Pulmonary arteries: Systolic pressure was within the normal range. - Inferior vena cava: The vessel  was normal in size. Impressions: - Normal LVEF. Severely elevated LVEDP. Critical aortic stenosis with peak/mean transaortic gradients 131/91 mmHg. Severe mitral stenosis with mean gradient 11 mmHg.  R/LHC 02/09/17 Conclusion    Mid RCA to Dist RCA lesion, 40 %stenosed.  Prox RCA to Mid RCA lesion, 20 %stenosed.  Ost Cx to Prox Cx lesion, 70 %stenosed.  Ost 3rd Mrg to 3rd Mrg lesion, 40 %stenosed.  Prox LAD to Mid LAD lesion, 30 %stenosed.  Dist LAD lesion, 20 %stenosed.  Ost 1st Diag lesion, 40 %stenosed.  Ost 2nd Diag to 2nd Diag lesion, 30 %stenosed.  There is severe aortic valve stenosis.  1. Single vessel CAD with moderately severe, calcified stenosis in the proximal Circumflex artery 2. Mild non-obstructive disease in the RCA and LAD 3. Severe aortic stenosis (mean gradient 18.53mmHg, peak gradient 35 mmHg, AVA 1.26 cm2 ).  Recommendations: Will review films with the TAVR team. We may be able to manage her CAD medically. Will continue workup for TAVR.     Patient Profile     Sonya Carpenter is a  71 y.o. 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 The patient appeared weak and pale prior to surgery so STAT labs were ordered which revealed acute blood loss anemia and AKI. She was admitted for further observation and work up.    Assessment & Plan    Acute blood loss anemia: HgB 5.4 (down from 9.9) mg/dL last week. Likely 2/2 dental extractions with significant blood loss over the weekend. No further blood loss since 11/26. She has received 2 U PRBCS. Hg is stable today at 9.4. Continue to monitor.   Acute on chronic diastolic CHF: 2/2 volume resuscitation with IVF and blood products. She was given one dose of IV  lasix 40mg  yesterday. CXR from yesterday shows pulmonary vascular congestion and she continues to have a cough and SOB. Will give one more dose of IV lasix 40mg  now.   AKI: creatinine 3.02 (up from 2.39) mg/dL last week. Likely from hypovolemia in the setting of acute blood loss. Creat improved to 2.6 today. Continue holding all nephrotxic agents. Watch closely with IV lasix today.  Severe AS: TAVR cancelled. We will tentatively plan to do surgery next Tuesday if she remains stable. We will need to follow her very closely.   DMT2: will treat with SSI   Fever: only mildly elevated. She does have cough. CXR last night with no signs of PNA. Will continue to monitor.   Dispo: HD stable. Okay for transfer to the floor.  Will discontinue A line. Keep in central line for blood draws  Signed, Angelena Form, PA-C  03/23/2017, 9:54 AM  Pager [phone]  Patient seen, examined. Available data reviewed. Agree with findings, assessment, and plan as outlined by Nell Range, PA-C.  On my exam this morning the patient is alert, oriented, in no distress.  JVP is normal.  Lung fields are clear.  Heart is regular rate and rhythm with a grade 4/6 harsh late peaking systolic murmur heard throughout.  Abdomen is soft and nontender.  Extremities show no edema.  Hemoglobin is improved to 9.4 mg/dL after receiving blood yesterday.  Renal function is better with creatinine 2.60 down from 3.02 and BUN is trending down as well.  Hemodynamics are stable.  Agree with continuing IV Lasix for diuresis with repeat labs tomorrow.  Patient is stable to transfer to the floor.  Will discuss timing of TAVR with our multidisciplinary team.  Disposition somewhat difficult in this patient who has multiple medical problems, recurrent heart failure and symptomatic anemia, critical aortic stenosis, with very limited social support system.   [name], M.D. 03/23/2017 1:08 PM

## 2017-03-24 DIAGNOSIS — L8990 Pressure ulcer of unspecified site, unspecified stage: Secondary | ICD-10-CM

## 2017-03-24 LAB — BASIC METABOLIC PANEL
ANION GAP: 10 (ref 5–15)
BUN: 80 mg/dL — ABNORMAL HIGH (ref 6–20)
CALCIUM: 9.1 mg/dL (ref 8.9–10.3)
CO2: 22 mmol/L (ref 22–32)
CREATININE: 2.57 mg/dL — AB (ref 0.44–1.00)
Chloride: 103 mmol/L (ref 101–111)
GFR calc non Af Amer: 18 mL/min — ABNORMAL LOW (ref >60)
GFR, EST AFRICAN AMERICAN: 21 mL/min — AB (ref >60)
Glucose, Bld: 310 mg/dL — ABNORMAL HIGH (ref 65–99)
Potassium: 3.7 mmol/L (ref 3.5–5.1)
SODIUM: 135 mmol/L (ref 135–145)

## 2017-03-24 LAB — GLUCOSE, CAPILLARY
GLUCOSE-CAPILLARY: 239 mg/dL — AB (ref 65–99)
GLUCOSE-CAPILLARY: 286 mg/dL — AB (ref 65–99)
GLUCOSE-CAPILLARY: 304 mg/dL — AB (ref 65–99)
GLUCOSE-CAPILLARY: 168 mg/dL — AB (ref 65–99)
Glucose-Capillary: 196 mg/dL — ABNORMAL HIGH (ref 65–99)

## 2017-03-24 LAB — CBC
HCT: 28.5 % — ABNORMAL LOW (ref 36.0–46.0)
HEMOGLOBIN: 9.4 g/dL — AB (ref 12.0–15.0)
MCH: 28.7 pg (ref 26.0–34.0)
MCHC: 33 g/dL (ref 30.0–36.0)
MCV: 86.9 fL (ref 78.0–100.0)
PLATELETS: 114 10*3/uL — AB (ref 150–400)
RBC: 3.28 MIL/uL — AB (ref 3.87–5.11)
RDW: 15.5 % (ref 11.5–15.5)
WBC: 5.1 10*3/uL (ref 4.0–10.5)

## 2017-03-24 MED ORDER — INSULIN GLARGINE 100 UNIT/ML ~~LOC~~ SOLN
10.0000 [IU] | Freq: Every day | SUBCUTANEOUS | Status: DC
  Administered 2017-03-24 – 2017-03-28 (×5): 10 [IU] via SUBCUTANEOUS
  Filled 2017-03-24 (×7): qty 0.1

## 2017-03-24 NOTE — Progress Notes (Signed)
Results for MARAI, TEEHAN (MRN [id number]) as of 03/24/2017 10:46  Ref. Range 03/22/2017 21:36 03/23/2017 08:40 03/23/2017 16:08 03/23/2017 21:12 03/24/2017 08:57  Glucose-Capillary Latest Ref Range: 65 - 99 mg/dL 156 (H) 116 (H) 385 (H) 273 (H) 286 (H)  Noted that blood sugars are beginning to be elevated and greater than 180 mg/dl.  Recommend adding Lantus 10-15 units daily (1/2 of home dose of Lantus 30 units daily) and continue Novolog MODERATE TID as ordered. Titrate dosage as needed.   Harvel Ricks RN BSN CDE Diabetes Coordinator Pager: [phone]  8am-5pm

## 2017-03-24 NOTE — Progress Notes (Signed)
[place] VALVE TEAM  Patient Name: Jacqueli Pangallo Date of Encounter: 03/24/2017  Primary Cardiologist: Dr.Zachary ([facility]ina [place], [place]) / Dr. Burt Knack & Dr. Roxy Manns (TAVR)   Hospital Problem List     Principal Problem:   Severe aortic stenosis Active Problems:   CKD (chronic kidney disease) stage 4, GFR 15-29 ml/min ([place])   Hypertension   Type 2 diabetes mellitus without complication, with long-term current use of insulin (HCC)   Anemia, iron deficiency   Acute blood loss anemia   Mitral stenosis   Acute on chronic diastolic heart failure (HCC)     Subjective   Up eating breakfast in bed. Feeling a little better. Still having cough.    Inpatient Medications    Scheduled Meds: . atorvastatin  20 mg Oral Daily  . ferrous sulfate  325 mg Oral BID  . insulin aspart  0-15 Units Subcutaneous TID WC  . loratadine  10 mg Oral Daily  . mouth rinse  15 mL Mouth Rinse BID  . mometasone-formoterol  2 puff Inhalation BID  . montelukast  10 mg Oral QHS  . protein supplement shake  11 oz Oral TID BM  . sodium chloride flush  3 mL Intravenous Q12H   Continuous Infusions: . sodium chloride    . sodium chloride     PRN Meds: sodium chloride, Place/Maintain arterial line **AND** sodium chloride, acetaminophen, albuterol, docusate sodium, guaiFENesin, lubiprostone, ondansetron (ZOFRAN) IV, sodium chloride flush   Vital Signs    Vitals:   03/23/17 2045 03/23/17 2149 03/24/17 0258 03/24/17 0900  BP: 117/61  131/68   Pulse: 96  96 96  Resp: 20  (!) 21 18  Temp: 98.8 F (37.1 C)  98 F (36.7 C)   TempSrc: Oral  Oral   SpO2: 98% 97% 97% 94%  Weight:      Height:        Intake/Output Summary (Last 24 hours) at 03/24/2017 1054 Last data filed at 03/24/2017 1000 Gross per 24 hour  Intake 723 ml  Output 900 ml  Net -177 ml   Filed Weights   03/22/17 1011 03/22/17 1654 03/23/17 0428  Weight: 105 lb (47.6 kg) 109 lb 2 oz (49.5 kg) 105 lb 13.1  oz (48 kg)    Physical Exam   General: chronically ill appearing. Coloring improved.  HEENT: normal Lymph: no adenopathy Neck: no JVD Endocrine:  No thryomegaly Vascular: No carotid bruits; FA pulses 2+ bilaterally without bruits  Cardiac:  normal S1, S2; RRR; 4/6 SEM @ RUSB Lungs:  clear to auscultation bilaterally. Few, faint wheezing. No rhonchi or rales  Abd: soft, nontender, no hepatomegaly  Ext: no edema Musculoskeletal:  No deformities, BUE and BLE strength normal and equal Skin: warm and dry, Extensive ecchymosis and hematoma around jaw and neck that is improving.  Neuro:  CNs 2-12 intact, no focal abnormalities noted Psych:  Normal affect     Labs    CBC Recent Labs    03/23/17 0355 03/24/17 0843  WBC 7.9 5.1  HGB 9.4* 9.4*  HCT 27.7* 28.5*  MCV 85.0 86.9  PLT 109* 161*   Basic Metabolic Panel Recent Labs    03/23/17 0355 03/24/17 0843  NA 141 135  K 3.9 3.7  CL 111 103  CO2 21* 22  GLUCOSE 110* 310*  BUN 100* 80*  CREATININE 2.60* 2.57*  CALCIUM 9.5 9.1   Liver Function Tests No results for input(s): AST, ALT, ALKPHOS, BILITOT, PROT, ALBUMIN in  the last 72 hours. No results for input(s): LIPASE, AMYLASE in the last 72 hours. Cardiac Enzymes No results for input(s): CKTOTAL, CKMB, CKMBINDEX, TROPONINI in the last 72 hours. BNP Invalid input(s): POCBNP D-Dimer No results for input(s): DDIMER in the last 72 hours. Hemoglobin A1C No results for input(s): HGBA1C in the last 72 hours. Fasting Lipid Panel No results for input(s): CHOL, HDL, LDLCALC, TRIG, CHOLHDL, LDLDIRECT in the last 72 hours. Thyroid Function Tests No results for input(s): TSH, T4TOTAL, T3FREE, THYROIDAB in the last 72 hours.  Invalid input(s): FREET3  Telemetry    sinus - Personally Reviewed  ECG   None this admission   Radiology    Dg Chest Port 1 View  Result Date: 03/22/2017 CLINICAL DATA:  Central line placement. EXAM: PORTABLE CHEST 1 VIEW COMPARISON:   03/18/2017 chest radiograph and prior CTs FINDINGS: Cardiomegaly and pulmonary vascular congestion again noted. A right IJ central venous catheter is present with tip overlying the superior cavoatrial junction. Mild bibasilar atelectasis/airspace disease noted. There is no evidence of pneumothorax. IMPRESSION: Right IJ central venous catheter placement with tip overlying the superior cavoatrial junction. Cardiomegaly, mild pulmonary vascular congestion and mild bibasilar atelectasis/airspace disease. Electronically Signed   By: Margarette Canada M.D.   On: 03/22/2017 17:02    Cardiac Studies   2D ECHO: 03/07/2017 Study Conclusions - Left ventricle: The cavity size was normal. There was moderate concentric hypertrophy. Systolic function was vigorous. The estimated ejection fraction was in the range of 65% to 70%. Wall motion was normal; there were no regional wall motion abnormalities. Doppler parameters are consistent with abnormal left ventricular relaxation (grade 1 diastolic dysfunction). Doppler parameters are consistent with both elevated ventricular end-diastolic filling pressure and elevated left atrial filling pressure. - Aortic valve: Trileaflet; severely thickened, severely calcified leaflets. Valve mobility was restricted. There was trivial regurgitation. Mean gradient (S): 91 mm Hg. Peak gradient (S): 131 mm Hg. - Ascending aorta: The ascending aorta was normal in size. - Mitral valve: Calcified annulus. Severely thickened, severely calcified leaflets . Mobility was restricted. The findings are consistent with severe stenosis. There was mild regurgitation. Peak gradient (D): 23 mm Hg. - Left atrium: The atrium was severely dilated. - Right ventricle: The cavity size was normal. Wall thickness was normal. Systolic function was normal. - Pulmonary arteries: Systolic pressure was within the normal range. - Inferior vena cava: The vessel was normal in  size. Impressions: - Normal LVEF. Severely elevated LVEDP. Critical aortic stenosis with peak/mean transaortic gradients 131/91 mmHg. Severe mitral stenosis with mean gradient 11 mmHg.  R/LHC 02/09/17 Conclusion    Mid RCA to Dist RCA lesion, 40 %stenosed.  Prox RCA to Mid RCA lesion, 20 %stenosed.  Ost Cx to Prox Cx lesion, 70 %stenosed.  Ost 3rd Mrg to 3rd Mrg lesion, 40 %stenosed.  Prox LAD to Mid LAD lesion, 30 %stenosed.  Dist LAD lesion, 20 %stenosed.  Ost 1st Diag lesion, 40 %stenosed.  Ost 2nd Diag to 2nd Diag lesion, 30 %stenosed.  There is severe aortic valve stenosis.  1. Single vessel CAD with moderately severe, calcified stenosis in the proximal Circumflex artery 2. Mild non-obstructive disease in the RCA and LAD 3. Severe aortic stenosis (mean gradient 18.38mmHg, peak gradient 35 mmHg, AVA 1.26 cm2 ).  Recommendations: Will review films with the TAVR team. We may be able to manage her CAD medically. Will continue workup for TAVR.     Patient Profile     Adreanne Yono is a 71 y.o. 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 The patient appeared weak and pale prior to surgery so STAT labs were ordered which revealed acute blood loss anemia and AKI. She was admitted for further observation and work up.    Assessment & Plan    Acute blood loss anemia: HgB 5.4 (down from 9.9) mg/dL last week. Likely 2/2 dental extractions with significant blood loss over the weekend. No further blood loss since 11/26. She has received 2 U PRBCS. Hg is stable today at 9.4. Continue to monitor.   Acute on chronic diastolic CHF: 2/2 volume resuscitation with IVF and blood products. She was given two doses of IV lasix 40mg .  Still has a cough but breathing improved.   AKI: creatinine 3.02 (up from 2.39) mg/dL last week. Likely from hypovolemia in the setting of acute blood loss. Creat improved to 2.57 today. Continue holding all nephrotxic agents.   Severe AS: TAVR cancelled given AKI and acute anemia. We will tentatively plan to do surgery next Tuesday if she remains stable. She has no family to help her at home and very debilitated. Given her lack of social support and tenuous status, we feel she needs to remain in the hospital until surgery next week.   DMT2: will treat with SSI. Diabetic coordinator recommend adding Lantus 10-15 units daily, which I will add.   Cough: no white count or fever. Lungs sound okay on exam. Will check CXR tomorrow AM. Continue cough syrup and supportive care.  SignedAngelena Form, PA-C  03/24/2017, 10:54 AM  Pager [phone]  Patient seen, examined. Available data reviewed. Agree with findings, assessment, and plan as outlined by Nell Range, PA-C.  On exam tonight, the patient appears stable.  She is frail and chronically ill-appearing.  JVP is normal.  Lung fields are clear.  Heart is regular rate and rhythm with a grade 4/6 systolic murmur at the right upper sternal border.  Abdomen is soft and nontender.  Extremities have no edema.  Labs are reviewed and demonstrate no leukocytosis, stable hemoglobin, stable creatinine.  She seems to be holding her own but she remains tenuous and weak.  Considering her lack of social support and tenuous medical condition, I agree that she needs to remain in the hospital for TAVR early next week.  She continues to cough and is very concerned that she has recurrent pneumonia.  We will repeat a chest x-ray tomorrow, but I do not see any clear clinical evidence of pneumonia at this point. Otherwise as outlined above.  [name], M.D. 03/24/2017 6:15 PM

## 2017-03-24 NOTE — Evaluation (Signed)
Physical Therapy Evaluation Patient Details Name: Sonya Carpenter MRN: [id number] DOB: [birth year] Today's Date: 03/24/2017   History of Present Illness  Pt presented to [facility] for planned TAVR but found to have acute blood loss anemia and AKI. TAVR put on hold. PMH -  aortic stenosis, copd, chf, htn, dm  Clinical Impression  Pt admitted with above diagnosis and presents to PT with functional limitations due to deficits listed below (See PT problem list). Pt needs skilled PT to maximize independence and safety to allow discharge to ST-SNF. Appears that pt is likely to have TAVR early next week. Will continue to follow to maximize independence and functional activity tolerance prior to and after TAVR.     Follow Up Recommendations SNF(after TAVR)    Equipment Recommendations  None recommended by PT    Recommendations for Other Services       Precautions / Restrictions Precautions Precautions: Fall      Mobility  Bed Mobility Overal bed mobility: Needs Assistance Bed Mobility: Supine to Sit     Supine to sit: Min assist     General bed mobility comments: Assist to elevate trunk into sitting  Transfers Overall transfer level: Needs assistance Equipment used: Rolling walker (2 wheeled) Transfers: Sit to/from [facility] Sit to Stand: Min guard Stand pivot transfers: Min guard       General transfer comment: Assist for safety and balance  Ambulation/Gait Ambulation/Gait assistance: Min guard Ambulation Distance (Feet): 250 Feet Assistive device: Rolling walker (2 wheeled) Gait Pattern/deviations: Step-through pattern;Decreased stride length Gait velocity: decr Gait velocity interpretation: Below normal speed for age/gender General Gait Details: Assist for safety. No loss of balance. Amb on RA with SpO2 98%. HR to 122  Stairs            Wheelchair Mobility    Modified Rankin (Stroke Patients Only)       Balance Overall balance assessment:  Needs assistance Sitting-balance support: Feet supported;No upper extremity supported Sitting balance-Leahy Scale: Good     Standing balance support: Single extremity supported Standing balance-Leahy Scale: Poor Standing balance comment: UE support and min guard for static standing                             Pertinent Vitals/Pain Pain Assessment: No/denies pain    Home Living Family/patient expects to be discharged to:: Private residence Living Arrangements: Alone Available Help at Discharge: Available PRN/intermittently Type of Home: House Home Access: Ramped entrance     Home Layout: One level Home Equipment: Environmental consultant - 2 wheels;Cane - single point      Prior Function Level of Independence: Independent with assistive device(s)         Comments: amb with rolling walker     Hand Dominance        Extremity/Trunk Assessment   Upper Extremity Assessment Upper Extremity Assessment: Generalized weakness    Lower Extremity Assessment Lower Extremity Assessment: Generalized weakness       Communication   Communication: No difficulties  Cognition Arousal/Alertness: Awake/alert Behavior During Therapy: WFL for tasks assessed/performed Overall Cognitive Status: Within Functional Limits for tasks assessed                                        General Comments      Exercises     Assessment/Plan    PT Assessment Patient  needs continued PT services  PT Problem List Decreased strength;Decreased activity tolerance;Decreased balance;Decreased mobility       PT Treatment Interventions DME instruction;Gait training;Functional mobility training;Therapeutic activities;Therapeutic exercise;Balance training;Patient/family education    PT Goals (Current goals can be found in the Care Plan section)  Acute Rehab PT Goals Patient Stated Goal: Return home PT Goal Formulation: With patient Time For Goal Achievement: 03/31/17 Potential to  Achieve Goals: Good    Frequency Min 3X/week   Barriers to discharge Decreased caregiver support Lives alone    Co-evaluation               AM-PAC PT "6 Clicks" Daily Activity  Outcome Measure Difficulty turning over in bed (including adjusting bedclothes, sheets and blankets)?: A Little Difficulty moving from lying on back to sitting on the side of the bed? : Unable Difficulty sitting down on and standing up from a chair with arms (e.g., wheelchair, bedside commode, etc,.)?: Unable Help needed moving to and from a bed to chair (including a wheelchair)?: A Little Help needed walking in hospital room?: A Little Help needed climbing 3-5 steps with a railing? : A Little 6 Click Score: 14    End of Session Equipment Utilized During Treatment: Gait belt Activity Tolerance: Patient tolerated treatment well Patient left: in chair;with call bell/phone within reach;with chair alarm set Nurse Communication: Mobility status PT Visit Diagnosis: Unsteadiness on feet (R26.81);Other abnormalities of gait and mobility (R26.89);Muscle weakness (generalized) (M62.81)    Time: 6015-6153 PT Time Calculation (min) (ACUTE ONLY): 30 min   Charges:   PT Evaluation $PT Eval Moderate Complexity: 1 Mod PT Treatments $Gait Training: 8-22 mins   PT G Codes:        [facility] PT [place] 03/24/2017, 12:12 PM

## 2017-03-25 ENCOUNTER — Inpatient Hospital Stay (HOSPITAL_COMMUNITY): Payer: Medicare HMO

## 2017-03-25 LAB — BASIC METABOLIC PANEL
ANION GAP: 9 (ref 5–15)
BUN: 74 mg/dL — ABNORMAL HIGH (ref 6–20)
CALCIUM: 9.5 mg/dL (ref 8.9–10.3)
CO2: 24 mmol/L (ref 22–32)
Chloride: 105 mmol/L (ref 101–111)
Creatinine, Ser: 2.44 mg/dL — ABNORMAL HIGH (ref 0.44–1.00)
GFR calc Af Amer: 22 mL/min — ABNORMAL LOW (ref >60)
GFR calc non Af Amer: 19 mL/min — ABNORMAL LOW (ref >60)
GLUCOSE: 182 mg/dL — AB (ref 65–99)
Potassium: 3.8 mmol/L (ref 3.5–5.1)
Sodium: 138 mmol/L (ref 135–145)

## 2017-03-25 LAB — TYPE AND SCREEN
ABO/RH(D): A POS
ANTIBODY SCREEN: NEGATIVE
UNIT DIVISION: 0
UNIT DIVISION: 0
Unit division: 0
Unit division: 0

## 2017-03-25 LAB — BPAM RBC
BLOOD PRODUCT EXPIRATION DATE: 201812132359
BLOOD PRODUCT EXPIRATION DATE: 201812132359
BLOOD PRODUCT EXPIRATION DATE: 201812132359
Blood Product Expiration Date: 201812072359
ISSUE DATE / TIME: 201811271211
ISSUE DATE / TIME: 201811271244
ISSUE DATE / TIME: 201811271211
ISSUE DATE / TIME: 201811271244
UNIT TYPE AND RH: 6200
UNIT TYPE AND RH: 6200
Unit Type and Rh: 6200
Unit Type and Rh: 6200

## 2017-03-25 LAB — GLUCOSE, CAPILLARY
GLUCOSE-CAPILLARY: 178 mg/dL — AB (ref 65–99)
Glucose-Capillary: 311 mg/dL — ABNORMAL HIGH (ref 65–99)
Glucose-Capillary: 252 mg/dL — ABNORMAL HIGH (ref 65–99)
Glucose-Capillary: 168 mg/dL — ABNORMAL HIGH (ref 65–99)

## 2017-03-25 LAB — CBC
HCT: 27.3 % — ABNORMAL LOW (ref 36.0–46.0)
HEMOGLOBIN: 9 g/dL — AB (ref 12.0–15.0)
MCH: 29.1 pg (ref 26.0–34.0)
MCHC: 33 g/dL (ref 30.0–36.0)
MCV: 88.3 fL (ref 78.0–100.0)
Platelets: 130 10*3/uL — ABNORMAL LOW (ref 150–400)
RBC: 3.09 MIL/uL — ABNORMAL LOW (ref 3.87–5.11)
RDW: 15.7 % — ABNORMAL HIGH (ref 11.5–15.5)
WBC: 4.3 10*3/uL (ref 4.0–10.5)

## 2017-03-25 MED ORDER — GUAIFENESIN-CODEINE 100-10 MG/5ML PO SOLN
5.0000 mL | ORAL | Status: DC | PRN
  Administered 2017-03-25 – 2017-03-28 (×6): 5 mL via ORAL
  Filled 2017-03-25 (×6): qty 5

## 2017-03-25 MED ORDER — FUROSEMIDE 10 MG/ML IJ SOLN
80.0000 mg | Freq: Once | INTRAMUSCULAR | Status: AC
  Administered 2017-03-25: 80 mg via INTRAVENOUS
  Filled 2017-03-25: qty 8

## 2017-03-25 NOTE — Plan of Care (Signed)
  Education: Knowledge of General Education information will improve 03/25/2017 0123 - Completed/Met by Peggye Pitt, RN

## 2017-03-25 NOTE — Clinical Social Work Note (Signed)
Clinical Social Work Assessment  Patient Details  Name: Sonya Carpenter MRN: [id number] Date of Birth: 08/15/1945  Date of referral:  03/25/17               Reason for consult:  Facility Placement                Permission sought to share information with:  Facility Contact Representative, Family Supports Permission granted to share information::  Yes, Verbal Permission Granted  Name::     Tammy Carter  Agency::  SNFs  Relationship::  cousin  Contact Information:  [phone]  Housing/Transportation Living arrangements for the past 2 months:  Single Family Home Source of Information:  Patient Patient Interpreter Needed:  None Criminal Activity/Legal Involvement Pertinent to Current Situation/Hospitalization:  No - Comment as needed Significant Relationships:  Other Family Members Lives with:  Self Do you feel safe going back to the place where you live?  Yes Need for family participation in patient care:  No (Coment)  Care giving concerns: Patient from home alone in [place]. PT recommending SNF. Patient to have TAVR done 03/29/17.   Social Worker assessment / plan:  CSW met with patient at bedside and discussed SNF recommendation. CSW discussed referral and discharge process, as well as need for [employer] authorization for SNF. Patient familiar with process and indicated she was in rehab earlier this year for about a week. Patient agreeable to SNF and prefers [facility] in [place]. Patient requested CSW call her cousin and POA, Tammy. CSW left voicemail for Tammy. CSW sent out initial SNF referrals; patient will need [employer] authorization for SNF.  Employment status:  Retired Insurance information:  Managed Medicare([employer]) PT Recommendations:  Skilled Nursing Facility Information / Referral to community resources:  Skilled Nursing Facility  Patient/Family's Response to care: Patient appreciative of care.  Patient/Family's Understanding of and Emotional Response to Diagnosis, Current  Treatment, and Prognosis: Patient with good understanding of her condition and hopeful for SNF placement.  Emotional Assessment Appearance:  Appears stated age Attitude/Demeanor/Rapport:  Other(appropriate) Affect (typically observed):  Calm Orientation:  Oriented to Self, Oriented to Place, Oriented to  Time, Oriented to Situation Alcohol / Substance use:  Not Applicable Psych involvement (Current and /or in the community):  No (Comment)  Discharge Needs  Concerns to be addressed:  Discharge Planning Concerns Readmission within the last 30 days:  Yes Current discharge risk:  Physical Impairment, Lives alone Barriers to Discharge:  Continued Medical Work up   Susan Porter, LCSW 03/25/2017, 12:19 PM  

## 2017-03-25 NOTE — Progress Notes (Signed)
Physical Therapy Treatment Patient Details Name: Sonya Carpenter MRN: [id number] DOB: 24-Aug-1945 Today's Date: 03/25/2017    History of Present Illness Pt presented to [facility] for planned TAVR but found to have acute blood loss anemia and AKI. TAVR put on hold. PMH -  aortic stenosis, copd, chf, htn, dm    PT Comments    Pt making steady progress and increasing activity tolerance.   Follow Up Recommendations  SNF(after TAVR)     Equipment Recommendations  None recommended by PT    Recommendations for Other Services       Precautions / Restrictions Precautions Precautions: Fall Restrictions Weight Bearing Restrictions: No    Mobility  Bed Mobility               General bed mobility comments: Pt up in chair  Transfers Overall transfer level: Needs assistance Equipment used: Rolling walker (2 wheeled) Transfers: Sit to/from [facility] Sit to Stand: Min guard Stand pivot transfers: Min guard       General transfer comment: Assist for safety and balance. Chair to bsc with stand pivot without assistive device.  Ambulation/Gait Ambulation/Gait assistance: Min guard Ambulation Distance (Feet): 300 Feet Assistive device: Rolling walker (2 wheeled) Gait Pattern/deviations: Step-through pattern;Decreased stride length;Trunk flexed Gait velocity: decr Gait velocity interpretation: Below normal speed for age/gender General Gait Details: Assist for safety. No loss of balance. Amb on RA with VSS.   Stairs            Wheelchair Mobility    Modified Rankin (Stroke Patients Only)       Balance Overall balance assessment: Needs assistance Sitting-balance support: Feet supported;No upper extremity supported Sitting balance-Leahy Scale: Good     Standing balance support: Single extremity supported Standing balance-Leahy Scale: Poor Standing balance comment: UE support and min guard for static standing                             Cognition Arousal/Alertness: Awake/alert Behavior During Therapy: WFL for tasks assessed/performed Overall Cognitive Status: Within Functional Limits for tasks assessed                                        Exercises      General Comments        Pertinent Vitals/Pain      Home Living                      Prior Function            PT Goals (current goals can now be found in the care plan section) Progress towards PT goals: Progressing toward goals    Frequency    Min 3X/week      PT Plan Current plan remains appropriate    Co-evaluation              AM-PAC PT "6 Clicks" Daily Activity  Outcome Measure  Difficulty turning over in bed (including adjusting bedclothes, sheets and blankets)?: A Little Difficulty moving from lying on back to sitting on the side of the bed? : Unable Difficulty sitting down on and standing up from a chair with arms (e.g., wheelchair, bedside commode, etc,.)?: Unable Help needed moving to and from a bed to chair (including a wheelchair)?: A Little Help needed walking in hospital room?: A Little Help needed climbing 3-5 steps  with a railing? : A Little 6 Click Score: 14    End of Session Equipment Utilized During Treatment: Gait belt Activity Tolerance: Patient tolerated treatment well Patient left: in chair;with call bell/phone within reach;with chair alarm set Nurse Communication: Mobility status PT Visit Diagnosis: Unsteadiness on feet (R26.81);Other abnormalities of gait and mobility (R26.89);Muscle weakness (generalized) (M62.81)     Time: 4314-2767 PT Time Calculation (min) (ACUTE ONLY): 25 min  Charges:  $Gait Training: 23-37 mins                    G Codes:       [facility] PT [place] 03/25/2017, 3:12 PM

## 2017-03-25 NOTE — NC FL2 (Signed)
[place] LEVEL OF CARE SCREENING TOOL     IDENTIFICATION  Patient Name: Sonya Carpenter Birthdate: Nov 21, 1945 Sex: female Admission Date (Current Location): 03/22/2017  [place] and [place] Number:  [place](Patient lives in [place], [place])   Facility and Address:  [facility], [place] [address], [place], [place] [address]      Provider Number: [id number]  Attending Physician Name and Address:  [name], MD  Relative Name and Phone Number:  Toma Aran, cousin and Foster, [phone]    Current Level of Care: Hospital Recommended Level of Care: [place] Prior Approval Number:    Date Approved/Denied:   PASRR Number:    Discharge Plan: SNF    Current Diagnoses: Patient Active Problem List   Diagnosis Date Noted  . Pressure injury of skin 03/24/2017  . Acute blood loss anemia 03/22/2017  . Anemia, iron deficiency   . Mitral stenosis   . Acute on chronic diastolic heart failure ([place])   . AKI (acute kidney injury) ([place]) 03/14/2017  . CKD (chronic kidney disease) stage 4, GFR 15-29 ml/min (HCC) 03/09/2017  . Cholecystitis 03/09/2017  . Hypertension 03/09/2017  . Hyperlipidemia 03/09/2017  . Type 2 diabetes mellitus without complication, with long-term current use of insulin ([place]) 03/09/2017  . Asthma 03/09/2017  . 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 Retained dental roots 03/09/2017  . Dental caries 03/09/2017  . Chronic periodontitis 03/09/2017  . 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 Loose, teeth 03/09/2017  . Severe aortic stenosis     Orientation RESPIRATION BLADDER Height & Weight     Self, Time, Situation, Place  Normal Incontinent, External catheter Weight: 100 lb 4.8 oz (45.5 kg) Height:  4\' 10"  (147.3 cm)  BEHAVIORAL SYMPTOMS/MOOD NEUROLOGICAL BOWEL NUTRITION STATUS      Continent Diet(please see DC summary)  AMBULATORY STATUS COMMUNICATION OF NEEDS Skin    Limited Assist Verbally PU Stage and Appropriate Care, Surgical wounds(PU stage I heel, PU stage I sacrum, incision on lip)                       Personal Care Assistance Level of Assistance  Bathing, Feeding, Dressing Bathing Assistance: Limited assistance Feeding assistance: Independent Dressing Assistance: Limited assistance     Functional Limitations Info  Sight, Hearing, Speech Sight Info: Adequate Hearing Info: Adequate Speech Info: Adequate    SPECIAL CARE FACTORS FREQUENCY  PT (By licensed PT)     PT Frequency: 5x/week              Contractures Contractures Info: Not present    Additional Factors Info  Code Status, Allergies, Insulin Sliding Scale Code Status Info: Full Allergies Info: No Known Allergies   Insulin Sliding Scale Info: insulin novolog 3x/day with meals, lantus daily       Current Medications (03/25/2017):  This is the current hospital active medication list Current Facility-Administered Medications  Medication Dose Route Frequency Provider Last Rate Last Dose  . 0.9 %  sodium chloride infusion  250 mL Intravenous PRN Eileen Stanford, PA-C      . 0.9 %  sodium chloride infusion   Intra-arterial PRN Eileen Stanford, PA-C      . acetaminophen (TYLENOL) tablet 500 mg  500 mg Oral Q6H PRN Eileen Stanford, PA-C      . albuterol (PROVENTIL) (2.5 MG/3ML) 0.083% nebulizer solution 2.5 mg  2.5 mg Inhalation Q6H PRN Eileen Stanford, PA-C  2.5 mg at 03/23/17 2147  . atorvastatin (LIPITOR) tablet 20 mg  20 mg Oral Daily Eileen Stanford, PA-C   20 mg at 03/25/17 1050  . docusate sodium (COLACE) capsule 100 mg  100 mg Oral BID PRN Eileen Stanford, PA-C   100 mg at 03/23/17 1714  . ferrous sulfate tablet 325 mg  325 mg Oral BID Eileen Stanford, PA-C   325 mg at 03/25/17 1050  . furosemide (LASIX) injection 80 mg  80 mg Intravenous Once Eileen Stanford, PA-C      . guaiFENesin-codeine 100-10 MG/5ML solution 5 mL  5 mL Oral  Q4H PRN Eileen Stanford, PA-C      . insulin aspart (novoLOG) injection 0-15 Units  0-15 Units Subcutaneous TID WC Eileen Stanford, PA-C   3 Units at 03/25/17 0831  . insulin glargine (LANTUS) injection 10 Units  10 Units Subcutaneous Daily Eileen Stanford, PA-C   10 Units at 03/25/17 1051  . loratadine (CLARITIN) tablet 10 mg  10 mg Oral Daily Eileen Stanford, PA-C   10 mg at 03/25/17 1050  . lubiprostone (AMITIZA) capsule 8 mcg  8 mcg Oral BID PRN Eileen Stanford, PA-C      . MEDLINE mouth rinse  15 mL Mouth Rinse BID [name], MD   15 mL at 03/25/17 1050  . mometasone-formoterol (DULERA) 200-5 MCG/ACT inhaler 2 puff  2 puff Inhalation BID Eileen Stanford, PA-C   2 puff at 03/25/17 0848  . montelukast (SINGULAIR) tablet 10 mg  10 mg Oral QHS Eileen Stanford, PA-C   10 mg at 03/24/17 2305  . ondansetron (ZOFRAN) injection 4 mg  4 mg Intravenous Q6H PRN Eileen Stanford, PA-C      . protein supplement (PREMIER PROTEIN) liquid  11 oz Oral TID BM Eileen Stanford, PA-C   11 oz at 03/24/17 0910  . sodium chloride flush (NS) 0.9 % injection 3 mL  3 mL Intravenous Q12H Eileen Stanford, PA-C   3 mL at 03/25/17 1051  . sodium chloride flush (NS) 0.9 % injection 3 mL  3 mL Intravenous PRN Eileen Stanford, PA-C         Discharge Medications: Please see discharge summary for a list of discharge medications.  Relevant Imaging Results:  Relevant Lab Results:   Additional Information SSN: [id number]; will have TAVR done before discharge  Estanislado Emms, LCSW

## 2017-03-25 NOTE — Progress Notes (Signed)
[place] VALVE TEAM  Patient Name: Tanessa Tidd Date of Encounter: 03/25/2017  Primary Cardiologist: Dr.Zachary (Angelina Sheriff, [place]) / Dr. Burt Knack & Dr. Roxy Manns (TAVR)   Hospital Problem List     Principal Problem:   Severe aortic stenosis Active Problems:   CKD (chronic kidney disease) stage 4, GFR 15-29 ml/min ([place])   Hypertension   Type 2 diabetes mellitus without complication, with long-term current use of insulin (HCC)   Anemia, iron deficiency   Acute blood loss anemia   Mitral stenosis   Acute on chronic diastolic heart failure (HCC)   Pressure injury of skin     Subjective   Still having cough. Feels a little more weak today. Asks for a cough syrup   Inpatient Medications    Scheduled Meds: . atorvastatin  20 mg Oral Daily  . ferrous sulfate  325 mg Oral BID  . insulin aspart  0-15 Units Subcutaneous TID WC  . insulin glargine  10 Units Subcutaneous Daily  . loratadine  10 mg Oral Daily  . mouth rinse  15 mL Mouth Rinse BID  . mometasone-formoterol  2 puff Inhalation BID  . montelukast  10 mg Oral QHS  . protein supplement shake  11 oz Oral TID BM  . sodium chloride flush  3 mL Intravenous Q12H   Continuous Infusions: . sodium chloride    . sodium chloride     PRN Meds: sodium chloride, Place/Maintain arterial line **AND** sodium chloride, acetaminophen, albuterol, docusate sodium, guaiFENesin, lubiprostone, ondansetron (ZOFRAN) IV, sodium chloride flush   Vital Signs    Vitals:   03/24/17 2028 03/25/17 0627 03/25/17 0825 03/25/17 0848  BP: (!) 121/56 134/74 (!) 115/53   Pulse: 90 90 73   Resp: 18 16 16    Temp: 99.3 F (37.4 C) 97.7 F (36.5 C) 98.5 F (36.9 C)   TempSrc: Oral Oral Oral   SpO2: 95% 97%  96%  Weight:  100 lb 4.8 oz (45.5 kg)    Height:        Intake/Output Summary (Last 24 hours) at 03/25/2017 1030 Last data filed at 03/24/2017 1416 Gross per 24 hour  Intake -  Output 200 ml  Net -200  ml   Filed Weights   03/22/17 1654 03/23/17 0428 03/25/17 0627  Weight: 109 lb 2 oz (49.5 kg) 105 lb 13.1 oz (48 kg) 100 lb 4.8 oz (45.5 kg)    Physical Exam   General: chronically ill appearing. Coloring improved.  HEENT: normal Lymph: no adenopathy Neck: no JVD Endocrine:  No thryomegaly Vascular: No carotid bruits; FA pulses 2+ bilaterally without bruits  Cardiac:  normal S1, S2; RRR; 4/6 SEM @ RUSB Lungs:  clear to auscultation bilaterally. Few, faint wheezing. No rhonchi or rales  Abd: soft, nontender, no hepatomegaly  Ext: no edema Musculoskeletal:  No deformities, BUE and BLE strength normal and equal Skin: warm and dry, Extensive ecchymosis and hematoma around jaw and neck that is improving.  Neuro:  CNs 2-12 intact, no focal abnormalities noted Psych:  Normal affect     Labs    CBC Recent Labs    03/24/17 0843 03/25/17 0756  WBC 5.1 4.3  HGB 9.4* 9.0*  HCT 28.5* 27.3*  MCV 86.9 88.3  PLT 114* 026*   Basic Metabolic Panel Recent Labs    03/24/17 0843 03/25/17 0756  NA 135 138  K 3.7 3.8  CL 103 105  CO2 22 24  GLUCOSE 310* 182*  BUN  80* 74*  CREATININE 2.57* 2.44*  CALCIUM 9.1 9.5   Liver Function Tests No results for input(s): AST, ALT, ALKPHOS, BILITOT, PROT, ALBUMIN in the last 72 hours. No results for input(s): LIPASE, AMYLASE in the last 72 hours. Cardiac Enzymes No results for input(s): CKTOTAL, CKMB, CKMBINDEX, TROPONINI in the last 72 hours. BNP Invalid input(s): POCBNP D-Dimer No results for input(s): DDIMER in the last 72 hours. Hemoglobin A1C No results for input(s): HGBA1C in the last 72 hours. Fasting Lipid Panel No results for input(s): CHOL, HDL, LDLCALC, TRIG, CHOLHDL, LDLDIRECT in the last 72 hours. Thyroid Function Tests No results for input(s): TSH, T4TOTAL, T3FREE, THYROIDAB in the last 72 hours.  Invalid input(s): FREET3  Telemetry    sinus - Personally Reviewed  ECG   None this admission   Radiology    Dg  Chest 2 View  Result Date: 03/25/2017 CLINICAL DATA:  Cough. EXAM: CHEST  2 VIEW COMPARISON:  Radiograph of March 22, 2017. FINDINGS: Stable cardiomegaly and central pulmonary vascular congestion. No pneumothorax or significant pleural effusion is noted. Right internal jugular catheter is unchanged in position. Mild bibasilar subsegmental atelectasis is noted. Bony thorax is unremarkable. IMPRESSION: Mild bibasilar subsegmental atelectasis. Stable cardiomegaly and central pulmonary vascular congestion. Electronically Signed   By: Marijo Conception, M.D.   On: 03/25/2017 09:48    Cardiac Studies   2D ECHO: 03/07/2017 Study Conclusions - Left ventricle: The cavity size was normal. There was moderate concentric hypertrophy. Systolic function was vigorous. The estimated ejection fraction was in the range of 65% to 70%. Wall motion was normal; there were no regional wall motion abnormalities. Doppler parameters are consistent with abnormal left ventricular relaxation (grade 1 diastolic dysfunction). Doppler parameters are consistent with both elevated ventricular end-diastolic filling pressure and elevated left atrial filling pressure. - Aortic valve: Trileaflet; severely thickened, severely calcified leaflets. Valve mobility was restricted. There was trivial regurgitation. Mean gradient (S): 91 mm Hg. Peak gradient (S): 131 mm Hg. - Ascending aorta: The ascending aorta was normal in size. - Mitral valve: Calcified annulus. Severely thickened, severely calcified leaflets . Mobility was restricted. The findings are consistent with severe stenosis. There was mild regurgitation. Peak gradient (D): 23 mm Hg. - Left atrium: The atrium was severely dilated. - Right ventricle: The cavity size was normal. Wall thickness was normal. Systolic function was normal. - Pulmonary arteries: Systolic pressure was within the normal range. - Inferior vena cava: The vessel  was normal in size. Impressions: - Normal LVEF. Severely elevated LVEDP. Critical aortic stenosis with peak/mean transaortic gradients 131/91 mmHg. Severe mitral stenosis with mean gradient 11 mmHg.  R/LHC 02/09/17 Conclusion    Mid RCA to Dist RCA lesion, 40 %stenosed.  Prox RCA to Mid RCA lesion, 20 %stenosed.  Ost Cx to Prox Cx lesion, 70 %stenosed.  Ost 3rd Mrg to 3rd Mrg lesion, 40 %stenosed.  Prox LAD to Mid LAD lesion, 30 %stenosed.  Dist LAD lesion, 20 %stenosed.  Ost 1st Diag lesion, 40 %stenosed.  Ost 2nd Diag to 2nd Diag lesion, 30 %stenosed.  There is severe aortic valve stenosis.  1. Single vessel CAD with moderately severe, calcified stenosis in the proximal Circumflex artery 2. Mild non-obstructive disease in the RCA and LAD 3. Severe aortic stenosis (mean gradient 18.9mmHg, peak gradient 35 mmHg, AVA 1.26 cm2 ).  Recommendations: Will review films with the TAVR team. We may be able to manage her CAD medically. Will continue workup for TAVR.     Patient Profile  Sonya Carpenter is a 71 y.o. 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 The patient appeared weak and pale prior to surgery so STAT labs were ordered which revealed acute blood loss anemia and AKI. She was admitted for further observation and work up.    Assessment & Plan    Acute blood loss anemia: HgB 5.4 on admission (down from 9.9) mg/dL last week. Likely 2/2 dental extractions with significant blood loss over the weekend. No further blood loss since 11/26. She has received 2 U PRBCS. Hg continues to slowly drift down, 9.0 today. Continue to monitor.   Acute on chronic diastolic CHF: 2/2 volume resuscitation with IVF and blood products.  She was given two doses of IV lasix 40mg . CXR continues to show pulmonary vascular congestion. Will treat with IV lasix 80mg  x 1 and follow clinically.   AKI: creatinine 3.02 on admission (up from 2.39) mg/dL last week. Likely from hypovolemia in the setting of acute blood loss. Creat improved to 2.44 today. Continue holding all nephrotxic agents.   Severe AS: TAVR cancelled given AKI and acute anemia. We will tentatively plan to do surgery next Tuesday if she remains stable. She has no family to help her at home and very debilitated. Given her lack of social support and tenuous status, we feel she needs to remain in the hospital until surgery next week.   DMT2: will treat with SSI. Diabetic coordinator recommend adding Lantus 10-15 units daily, which was added.   Cough: CXR to no evidence of PNA. Still some fluid. Will treated with lasix. I have also ordered a cough syrup.   SignedAngelena Form, PA-C  03/25/2017, 10:30 AM  Pager [phone]  Patient seen, examined. Available data reviewed. Agree with findings, assessment, and plan as outlined by Nell Range, PA-C. Pt was seen 11-30 am and had no new complaints. She remains concerned about her cough. On exam, she is alert, oriented, in NAD. Lungs CTA, heart RRR 4/6 systolic murmur RUSB, abd soft, NT, extremities no edema. Pt with critical AS, CKD IV, acute blood loss anemia. IV diuresis with lasix 80 mg x 1 to see how she responds. Otherwise continue same Rx. Plans for TAVR next Tuesday.  [name], M.D. 03/26/2017 9:21 PM

## 2017-03-26 LAB — BASIC METABOLIC PANEL
Anion gap: 9 (ref 5–15)
BUN: 65 mg/dL — AB (ref 6–20)
CALCIUM: 9.6 mg/dL (ref 8.9–10.3)
CHLORIDE: 105 mmol/L (ref 101–111)
CO2: 26 mmol/L (ref 22–32)
CREATININE: 2.34 mg/dL — AB (ref 0.44–1.00)
GFR calc Af Amer: 23 mL/min — ABNORMAL LOW (ref >60)
GFR calc non Af Amer: 20 mL/min — ABNORMAL LOW (ref >60)
Glucose, Bld: 127 mg/dL — ABNORMAL HIGH (ref 65–99)
Potassium: 3.9 mmol/L (ref 3.5–5.1)
SODIUM: 140 mmol/L (ref 135–145)

## 2017-03-26 LAB — CBC
HCT: 28.9 % — ABNORMAL LOW (ref 36.0–46.0)
Hemoglobin: 9.5 g/dL — ABNORMAL LOW (ref 12.0–15.0)
MCH: 29.1 pg (ref 26.0–34.0)
MCHC: 32.9 g/dL (ref 30.0–36.0)
MCV: 88.7 fL (ref 78.0–100.0)
PLATELETS: 147 10*3/uL — AB (ref 150–400)
RBC: 3.26 MIL/uL — ABNORMAL LOW (ref 3.87–5.11)
RDW: 15.4 % (ref 11.5–15.5)
WBC: 4.9 10*3/uL (ref 4.0–10.5)

## 2017-03-26 LAB — GLUCOSE, CAPILLARY
GLUCOSE-CAPILLARY: 221 mg/dL — AB (ref 65–99)
GLUCOSE-CAPILLARY: 348 mg/dL — AB (ref 65–99)
Glucose-Capillary: 173 mg/dL — ABNORMAL HIGH (ref 65–99)
Glucose-Capillary: 247 mg/dL — ABNORMAL HIGH (ref 65–99)

## 2017-03-26 NOTE — Progress Notes (Signed)
Subjective:  Ill complaining of coughing.  Breathing is better.  No significant blood loss.  Objective:  Vital Signs in the last 24 hours: BP (!) 94/51 (BP Location: Right Arm)   Pulse 81   Temp 97.6 F (36.4 C) (Oral)   Resp 18   Ht 4\' 10"  (1.473 m)   Wt 48.2 kg (106 lb 4.2 oz)   SpO2 92%   BMI 22.21 kg/m   Physical Exam: Frail-appearing thin elderly female in no acute distress Lungs:  Clear Cardiac:  Regular rhythm, normal S1 and S2, no S3, harsh 2/6 systolic murmur of aortic stenosis Extremities:  No edema present Face: Significant ecchymosis and bruising  Intake/Output from previous day: 11/30 0701 - 12/01 0700 In: 0  Out: 700 [Urine:700]  Weight Filed Weights   03/23/17 0428 03/25/17 0627 03/26/17 0623  Weight: 48 kg (105 lb 13.1 oz) 45.5 kg (100 lb 4.8 oz) 48.2 kg (106 lb 4.2 oz)    Lab Results: Basic Metabolic Panel: Recent Labs    03/25/17 0756 03/26/17 0500  NA 138 140  K 3.8 3.9  CL 105 105  CO2 24 26  GLUCOSE 182* 127*  BUN 74* 65*  CREATININE 2.44* 2.34*   CBC: Recent Labs    03/25/17 0756 03/26/17 0500  WBC 4.3 4.9  HGB 9.0* 9.5*  HCT 27.3* 28.9*  MCV 88.3 88.7  PLT 130* 147*   Telemetry: Normal sinus rhythm  Assessment/Plan:  1.  Severe aortic stenosis-tentatively plan TAVR on Tuesday 2.  Acute on chronic diastolic heart failure 3.  Continued cough 4.  Previous acute blood loss anemia hemoglobin remaining stable following transfusions 5.  Chronic kidney disease continues to improve some  Recommendations:  Continue current medicines and watch renal function.  Tentatively plan TAVR next week.      Kerry Hough  MD [facility] Cardiology  03/26/2017, 12:21 PM

## 2017-03-26 NOTE — Plan of Care (Signed)
  Safety: Ability to remain free from injury will improve 03/26/2017 0543 - Progressing by Peggye Pitt, RN

## 2017-03-27 LAB — BASIC METABOLIC PANEL
Anion gap: 9 (ref 5–15)
BUN: 65 mg/dL — AB (ref 6–20)
CALCIUM: 9.5 mg/dL (ref 8.9–10.3)
CO2: 25 mmol/L (ref 22–32)
CREATININE: 2.36 mg/dL — AB (ref 0.44–1.00)
Chloride: 102 mmol/L (ref 101–111)
GFR calc Af Amer: 23 mL/min — ABNORMAL LOW (ref >60)
GFR, EST NON AFRICAN AMERICAN: 20 mL/min — AB (ref >60)
GLUCOSE: 221 mg/dL — AB (ref 65–99)
Potassium: 4.1 mmol/L (ref 3.5–5.1)
SODIUM: 136 mmol/L (ref 135–145)

## 2017-03-27 LAB — GLUCOSE, CAPILLARY
GLUCOSE-CAPILLARY: 208 mg/dL — AB (ref 65–99)
GLUCOSE-CAPILLARY: 178 mg/dL — AB (ref 65–99)
GLUCOSE-CAPILLARY: 136 mg/dL — AB (ref 65–99)
Glucose-Capillary: 218 mg/dL — ABNORMAL HIGH (ref 65–99)
Glucose-Capillary: 242 mg/dL — ABNORMAL HIGH (ref 65–99)

## 2017-03-27 LAB — CBC
HCT: 28.7 % — ABNORMAL LOW (ref 36.0–46.0)
Hemoglobin: 9.3 g/dL — ABNORMAL LOW (ref 12.0–15.0)
MCH: 29.1 pg (ref 26.0–34.0)
MCHC: 32.4 g/dL (ref 30.0–36.0)
MCV: 89.7 fL (ref 78.0–100.0)
PLATELETS: 142 10*3/uL — AB (ref 150–400)
RBC: 3.2 MIL/uL — ABNORMAL LOW (ref 3.87–5.11)
RDW: 15 % (ref 11.5–15.5)
WBC: 4.5 10*3/uL (ref 4.0–10.5)

## 2017-03-27 LAB — COMPREHENSIVE METABOLIC PANEL
ALK PHOS: 77 U/L (ref 38–126)
ALT: 22 U/L (ref 14–54)
ANION GAP: 7 (ref 5–15)
AST: 22 U/L (ref 15–41)
Albumin: 2.9 g/dL — ABNORMAL LOW (ref 3.5–5.0)
BUN: 64 mg/dL — ABNORMAL HIGH (ref 6–20)
CALCIUM: 9.5 mg/dL (ref 8.9–10.3)
CHLORIDE: 103 mmol/L (ref 101–111)
CO2: 27 mmol/L (ref 22–32)
CREATININE: 2.38 mg/dL — AB (ref 0.44–1.00)
GFR, EST AFRICAN AMERICAN: 23 mL/min — AB (ref >60)
GFR, EST NON AFRICAN AMERICAN: 20 mL/min — AB (ref >60)
Glucose, Bld: 208 mg/dL — ABNORMAL HIGH (ref 65–99)
Potassium: 4.9 mmol/L (ref 3.5–5.1)
SODIUM: 137 mmol/L (ref 135–145)
Total Bilirubin: 0.6 mg/dL (ref 0.3–1.2)
Total Protein: 6.2 g/dL — ABNORMAL LOW (ref 6.5–8.1)

## 2017-03-27 LAB — AMYLASE: AMYLASE: 141 U/L — AB (ref 28–100)

## 2017-03-27 MED ORDER — SODIUM CHLORIDE 0.9% FLUSH: 10.0000 mL | INTRAVENOUS | Status: DC | PRN

## 2017-03-27 NOTE — Progress Notes (Signed)
Subjective:  She just finished vomiting.  She is coughing less than she did previously.  Was nauseated slightly before vomiting but feels better now.  Not currently short of breath.  Objective:  Vital Signs in the last 24 hours: BP 119/64 (BP Location: Left Arm)   Pulse 82   Temp 98.3 F (36.8 C) (Oral)   Resp 16   Ht 4\' 10"  (1.473 m)   Wt 46 kg (101 lb 6.4 oz)   SpO2 94%   BMI 21.19 kg/m   Physical Exam: Frail-appearing thin elderly female in no acute distress Lungs:  Clear Cardiac:  Regular rhythm, normal S1 and S2, no S3, harsh 2/6 systolic murmur of aortic stenosis Abdomen soft nontender no masses Extremities:  No edema present Face: Significant ecchymosis and bruising  Intake/Output from previous day: 12/01 0701 - 12/02 0700 In: -  Out: 300 [Urine:300]  Weight Filed Weights   03/25/17 0627 03/26/17 0623 03/27/17 0329  Weight: 45.5 kg (100 lb 4.8 oz) 48.2 kg (106 lb 4.2 oz) 46 kg (101 lb 6.4 oz)    Lab Results: Basic Metabolic Panel: Recent Labs    03/26/17 0500 03/27/17 0614  NA 140 136  K 3.9 4.1  CL 105 102  CO2 26 25  GLUCOSE 127* 221*  BUN 65* 65*  CREATININE 2.34* 2.36*   CBC: Recent Labs    03/26/17 0500 03/27/17 0614  WBC 4.9 4.5  HGB 9.5* 9.3*  HCT 28.9* 28.7*  MCV 88.7 89.7  PLT 147* 142*   Telemetry: Normal sinus rhythm  Assessment/Plan:  1.  Severe aortic stenosis-tentatively plan TAVR on Tuesday 2.  Acute on chronic diastolic heart failure 3.  Continued cough 4.  Previous acute blood loss anemia hemoglobin remaining stable following transfusions 5.  Chronic kidney disease Levada Dy to have plateaued 6.  Vomiting of uncertain etiology-I will check a metabolic panel with liver tests continue to watch this.  Recommendations:  Use Zofran as needed for nausea.  Obtain copies of metabolic panel and amylase.  She does have known gallbladder disease.  Tentatively plan TAVR next week.      Kerry Hough  MD  [facility] Cardiology  03/27/2017, 11:27 AM

## 2017-03-28 LAB — URINALYSIS, ROUTINE W REFLEX MICROSCOPIC
BILIRUBIN URINE: NEGATIVE
Glucose, UA: >=500 mg/dL — AB
KETONES UR: NEGATIVE mg/dL
Nitrite: POSITIVE — AB
PH: 5 (ref 5.0–8.0)
Protein, ur: 100 mg/dL — AB
SQUAMOUS EPITHELIAL / LPF: NONE SEEN
Specific Gravity, Urine: 1.011 (ref 1.005–1.030)

## 2017-03-28 LAB — BASIC METABOLIC PANEL
ANION GAP: 7 (ref 5–15)
BUN: 70 mg/dL — ABNORMAL HIGH (ref 6–20)
CALCIUM: 9.7 mg/dL (ref 8.9–10.3)
CO2: 27 mmol/L (ref 22–32)
Chloride: 103 mmol/L (ref 101–111)
Creatinine, Ser: 2.54 mg/dL — ABNORMAL HIGH (ref 0.44–1.00)
GFR calc Af Amer: 21 mL/min — ABNORMAL LOW (ref >60)
GFR, EST NON AFRICAN AMERICAN: 18 mL/min — AB (ref >60)
Glucose, Bld: 291 mg/dL — ABNORMAL HIGH (ref 65–99)
POTASSIUM: 4.8 mmol/L (ref 3.5–5.1)
SODIUM: 137 mmol/L (ref 135–145)

## 2017-03-28 LAB — GLUCOSE, CAPILLARY
GLUCOSE-CAPILLARY: 275 mg/dL — AB (ref 65–99)
Glucose-Capillary: 117 mg/dL — ABNORMAL HIGH (ref 65–99)
Glucose-Capillary: 399 mg/dL — ABNORMAL HIGH (ref 65–99)
Glucose-Capillary: 70 mg/dL (ref 65–99)

## 2017-03-28 LAB — CBC
HEMATOCRIT: 28.3 % — AB (ref 36.0–46.0)
HEMOGLOBIN: 9.1 g/dL — AB (ref 12.0–15.0)
MCH: 28.9 pg (ref 26.0–34.0)
MCHC: 32.2 g/dL (ref 30.0–36.0)
MCV: 89.8 fL (ref 78.0–100.0)
Platelets: 138 10*3/uL — ABNORMAL LOW (ref 150–400)
RBC: 3.15 MIL/uL — ABNORMAL LOW (ref 3.87–5.11)
RDW: 14.8 % (ref 11.5–15.5)
WBC: 4.5 10*3/uL (ref 4.0–10.5)

## 2017-03-28 LAB — PROTIME-INR
INR: 1.17
Prothrombin Time: 14.8 s (ref 11.4–15.2)

## 2017-03-28 MED ORDER — VANCOMYCIN HCL 10 G IV SOLR
1250.0000 mg | INTRAVENOUS | Status: DC
  Filled 2017-03-28: qty 1250

## 2017-03-28 MED ORDER — NOREPINEPHRINE BITARTRATE 1 MG/ML IV SOLN
0.0000 ug/min | INTRAVENOUS | Status: DC
  Filled 2017-03-28: qty 4

## 2017-03-28 MED ORDER — MAGNESIUM HYDROXIDE 400 MG/5ML PO SUSP
30.0000 mL | Freq: Once | ORAL | Status: AC
  Administered 2017-03-28: 30 mL via ORAL
  Filled 2017-03-28: qty 30

## 2017-03-28 MED ORDER — TEMAZEPAM 7.5 MG PO CAPS: 15.0000 mg | ORAL_CAPSULE | Freq: Once | ORAL | Status: DC | PRN

## 2017-03-28 MED ORDER — DEXMEDETOMIDINE HCL IN NACL 400 MCG/100ML IV SOLN
0.1000 ug/kg/h | INTRAVENOUS | Status: AC
  Administered 2017-03-29: .5 ug/kg/h via INTRAVENOUS
  Filled 2017-03-28: qty 100

## 2017-03-28 MED ORDER — EPINEPHRINE PF 1 MG/ML IJ SOLN
0.0000 ug/min | INTRAVENOUS | Status: DC
  Filled 2017-03-28: qty 4

## 2017-03-28 MED ORDER — DEXTROSE 5 % IV SOLN
1.5000 g | INTRAVENOUS | Status: DC
  Filled 2017-03-28: qty 1.5

## 2017-03-28 MED ORDER — CHLORHEXIDINE GLUCONATE 0.12 % MT SOLN
15.0000 mL | Freq: Once | OROMUCOSAL | Status: AC
  Administered 2017-03-29: 15 mL via OROMUCOSAL
  Filled 2017-03-28: qty 15

## 2017-03-28 MED ORDER — DOPAMINE-DEXTROSE 3.2-5 MG/ML-% IV SOLN
0.0000 ug/kg/min | INTRAVENOUS | Status: DC
  Filled 2017-03-28: qty 250

## 2017-03-28 MED ORDER — CHLORHEXIDINE GLUCONATE 4 % EX LIQD
1.0000 "application " | Freq: Once | CUTANEOUS | Status: AC
  Administered 2017-03-29: 1 via TOPICAL
  Filled 2017-03-28: qty 15

## 2017-03-28 MED ORDER — MAGNESIUM SULFATE 50 % IJ SOLN
40.0000 meq | INTRAMUSCULAR | Status: DC
  Filled 2017-03-28 (×2): qty 9.85

## 2017-03-28 MED ORDER — BISACODYL 5 MG PO TBEC
5.0000 mg | DELAYED_RELEASE_TABLET | Freq: Once | ORAL | Status: AC
  Administered 2017-03-28: 5 mg via ORAL
  Filled 2017-03-28: qty 1

## 2017-03-28 MED ORDER — NITROGLYCERIN IN D5W 200-5 MCG/ML-% IV SOLN
2.0000 ug/min | INTRAVENOUS | Status: DC
  Filled 2017-03-28: qty 250

## 2017-03-28 MED ORDER — SODIUM CHLORIDE 0.9 % IV SOLN
INTRAVENOUS | Status: DC
  Filled 2017-03-28: qty 30

## 2017-03-28 MED ORDER — ASPIRIN EC 81 MG PO TBEC
81.0000 mg | DELAYED_RELEASE_TABLET | Freq: Every day | ORAL | Status: DC
  Administered 2017-03-28 – 2017-03-29 (×2): 81 mg via ORAL
  Filled 2017-03-28 (×2): qty 1

## 2017-03-28 MED ORDER — SODIUM CHLORIDE 0.9 % IV SOLN
30.0000 ug/min | INTRAVENOUS | Status: DC
  Filled 2017-03-28: qty 2

## 2017-03-28 MED ORDER — SODIUM CHLORIDE 0.9 % IV SOLN
INTRAVENOUS | Status: DC
  Filled 2017-03-28: qty 1

## 2017-03-28 MED ORDER — POTASSIUM CHLORIDE 2 MEQ/ML IV SOLN
80.0000 meq | INTRAVENOUS | Status: DC
  Filled 2017-03-28: qty 40

## 2017-03-28 NOTE — Care Management Note (Signed)
Case Management Note Original Note Created Zenon Mayo, RN 03/23/2017, 2:19 PM  Patient Details  Name: Lonya Johannesen MRN: [id number] Date of Birth: [birth year]  Subjective/Objective:   From home alone, she has a sister who lives in [place] close by her, she has a rolling walker and a w/chair at home, she uses the walker a lot.  She states she has been to [facility] before and she liked that facility.  She is  s/p TAVR and looks very weak, ABLA hgb down to 5.4, received transfusion.  Will need pt/ot eval.  Most likely will need SNF. Await pt/ot eval.                 Action/Plan: NCM will follow for dc needs.   Expected Discharge Date:                  Expected Discharge Plan:     In-House Referral:     Discharge planning Services  CM Consult  Post Acute Care Choice:    Choice offered to:     DME Arranged:    DME Agency:     HH Arranged:    HH Agency:     Status of Service:  In process, will continue to follow  If discussed at Long Length of Stay Meetings, dates discussed:    Discharge Disposition:   Additional Comments:  03/28/17- 1240- Marvetta Gibbons RN, CM- pt for OR tomorrow 12/4 for TAVR-- CSW following for SNF placement post procedure pending insurance auth.   Dahlia Client [place], RN 03/28/2017, 12:43 PM [phone]

## 2017-03-28 NOTE — Progress Notes (Signed)
[place] VALVE TEAM  Patient Name: Sonya Carpenter Date of Encounter: 03/28/2017  Primary Cardiologist: Dr.Zachary ([facility]ina Sheriff, [place]) / Dr. Burt Knack & Dr. Roxy Manns (TAVR)   Hospital Problem List     Principal Problem:   Severe aortic stenosis Active Problems:   CKD (chronic kidney disease) stage 4, GFR 15-29 ml/min ([place])   Hypertension   Type 2 diabetes mellitus without complication, with long-term current use of insulin (HCC)   Anemia, iron deficiency   Acute blood loss anemia   Mitral stenosis   Acute on chronic diastolic heart failure (HCC)   Pressure injury of skin     Subjective   Feels pretty good today. Cough is better. No more n/v. Anxious to get TAVR tomorrow.   Inpatient Medications    Scheduled Meds: . atorvastatin  20 mg Oral Daily  . ferrous sulfate  325 mg Oral BID  . insulin aspart  0-15 Units Subcutaneous TID WC  . insulin glargine  10 Units Subcutaneous Daily  . loratadine  10 mg Oral Daily  . mouth rinse  15 mL Mouth Rinse BID  . mometasone-formoterol  2 puff Inhalation BID  . montelukast  10 mg Oral QHS  . protein supplement shake  11 oz Oral TID BM  . sodium chloride flush  3 mL Intravenous Q12H   Continuous Infusions: . sodium chloride    . sodium chloride     PRN Meds: sodium chloride, Place/Maintain arterial line **AND** sodium chloride, acetaminophen, albuterol, docusate sodium, guaiFENesin-codeine, lubiprostone, ondansetron (ZOFRAN) IV, sodium chloride flush, sodium chloride flush   Vital Signs    Vitals:   03/27/17 1946 03/27/17 2003 03/28/17 0000 03/28/17 0444  BP: 104/63  (!) 94/58 120/61  Pulse: 89   83  Resp: (!) 26  19 15   Temp: 99.2 F (37.3 C)   98.2 F (36.8 C)  TempSrc: Oral   Oral  SpO2:  96%    Weight:    107 lb 5.8 oz (48.7 kg)  Height:        Intake/Output Summary (Last 24 hours) at 03/28/2017 0901 Last data filed at 03/28/2017 0603 Gross per 24 hour  Intake 723 ml  Output 1800  ml  Net -1077 ml   Filed Weights   03/26/17 0623 03/27/17 0329 03/28/17 0444  Weight: 106 lb 4.2 oz (48.2 kg) 101 lb 6.4 oz (46 kg) 107 lb 5.8 oz (48.7 kg)    Physical Exam   General: chronically ill appearing.  HEENT: normal Lymph: no adenopathy Neck: no JVD Endocrine:  No thryomegaly Vascular: No carotid bruits; FA pulses 2+ bilaterally without bruits  Cardiac:  normal S1, S2; RRR; 4/6 SEM @ RUSB Lungs:  clear to auscultation bilaterally. Few, faint wheezing. No rhonchi or rales  Abd: soft, nontender, no hepatomegaly  Ext: no edema Musculoskeletal:  No deformities, BUE and BLE strength normal and equal Skin: warm and dry. Healing hematomas around mouth and neck Neuro:  CNs 2-12 intact, no focal abnormalities noted Psych:  Normal affect    Labs    CBC Recent Labs    03/27/17 0614 03/28/17 0435  WBC 4.5 4.5  HGB 9.3* 9.1*  HCT 28.7* 28.3*  MCV 89.7 89.8  PLT 142* 782*   Basic Metabolic Panel Recent Labs    03/27/17 1331 03/28/17 0435  NA 137 137  K 4.9 4.8  CL 103 103  CO2 27 27  GLUCOSE 208* 291*  BUN 64* 70*  CREATININE 2.38* 2.54*  CALCIUM 9.5 9.7   Liver Function Tests Recent Labs    03/27/17 1331  AST 22  ALT 22  ALKPHOS 77  BILITOT 0.6  PROT 6.2*  ALBUMIN 2.9*   Recent Labs    03/27/17 1331  AMYLASE 141*   Cardiac Enzymes No results for input(s): CKTOTAL, CKMB, CKMBINDEX, TROPONINI in the last 72 hours. BNP Invalid input(s): POCBNP D-Dimer No results for input(s): DDIMER in the last 72 hours. Hemoglobin A1C No results for input(s): HGBA1C in the last 72 hours. Fasting Lipid Panel No results for input(s): CHOL, HDL, LDLCALC, TRIG, CHOLHDL, LDLDIRECT in the last 72 hours. Thyroid Function Tests No results for input(s): TSH, T4TOTAL, T3FREE, THYROIDAB in the last 72 hours.  Invalid input(s): FREET3  Telemetry    sinus - Personally Reviewed  ECG   None this admission   Radiology    No results found.  Cardiac Studies    2D ECHO: 03/07/2017 Study Conclusions - Left ventricle: The cavity size was normal. There was moderate concentric hypertrophy. Systolic function was vigorous. The estimated ejection fraction was in the range of 65% to 70%. Wall motion was normal; there were no regional wall motion abnormalities. Doppler parameters are consistent with abnormal left ventricular relaxation (grade 1 diastolic dysfunction). Doppler parameters are consistent with both elevated ventricular end-diastolic filling pressure and elevated left atrial filling pressure. - Aortic valve: Trileaflet; severely thickened, severely calcified leaflets. Valve mobility was restricted. There was trivial regurgitation. Mean gradient (S): 91 mm Hg. Peak gradient (S): 131 mm Hg. - Ascending aorta: The ascending aorta was normal in size. - Mitral valve: Calcified annulus. Severely thickened, severely calcified leaflets . Mobility was restricted. The findings are consistent with severe stenosis. There was mild regurgitation. Peak gradient (D): 23 mm Hg. - Left atrium: The atrium was severely dilated. - Right ventricle: The cavity size was normal. Wall thickness was normal. Systolic function was normal. - Pulmonary arteries: Systolic pressure was within the normal range. - Inferior vena cava: The vessel was normal in size. Impressions: - Normal LVEF. Severely elevated LVEDP. Critical aortic stenosis with peak/mean transaortic gradients 131/91 mmHg. Severe mitral stenosis with mean gradient 11 mmHg.  R/LHC 02/09/17 Conclusion    Mid RCA to Dist RCA lesion, 40 %stenosed.  Prox RCA to Mid RCA lesion, 20 %stenosed.  Ost Cx to Prox Cx lesion, 70 %stenosed.  Ost 3rd Mrg to 3rd Mrg lesion, 40 %stenosed.  Prox LAD to Mid LAD lesion, 30 %stenosed.  Dist LAD lesion, 20 %stenosed.  Ost 1st Diag lesion, 40 %stenosed.  Ost 2nd Diag to 2nd Diag lesion, 30 %stenosed.  There is severe  aortic valve stenosis.  1. Single vessel CAD with moderately severe, calcified stenosis in the proximal Circumflex artery 2. Mild non-obstructive disease in the RCA and LAD 3. Severe aortic stenosis (mean gradient 18.50mmHg, peak gradient 35 mmHg, AVA 1.26 cm2 ).  Recommendations: Will review films with the TAVR team. We may be able to manage her CAD medically. Will continue workup for TAVR.     Patient Profile     Millisa Giarrusso is a 71 y.o. 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 The patient appeared weak and pale prior to surgery so STAT labs were ordered which revealed acute blood loss anemia and AKI. She was admitted for further  observation and work up.    Assessment & Plan    Acute blood loss anemia: HgB 5.4 on admission (down from 9.9) mg/dL last week. Likely 2/2 dental extractions with significant blood loss. No further blood loss since 11/26. She received 2 U PRBCS on 11/27. Hg has remained stable around 9.1 since transfusion.  Acute on chronic diastolic CHF: 2/2 volume resuscitation with IVF and blood products. She was given several doses of IV lasix. She appears euvolemic today.   AKI: creatinine 3.02 on admission (up from 2.39) mg/dL last week. Likely from hypovolemia in the setting of acute blood loss. Creat stable at 2.54 today. Continue holding all nephrotxic agents.   Severe AS: TAVR cancelled last Tuesday given AKI and acute anemia. She has remained inpatient for close monitoring. Hg and creat have stabilized. Will plan for TAVR tomorrow. Will add back ASA 81mg  daily.  DMT2: continue SSI. Diabetic coordinator recommend adding Lantus 10-15 units daily, which was added.   Cough: this has improved with IV lasix and cough  syrup.   Vomiting: this has resolved. Mildly elevated Amylase  Dispo: she has very little social support at home and very weak and frail. She will need to go to SNF at discharge.   SignedAngelena Form, PA-C  03/28/2017, 9:01 AM  Pager [phone]  Patient seen, examined. Available data reviewed. Agree with findings, assessment, and plan as outlined by Nell Range, PA-C. Exam shows a chronically ill-appearing woman in NAD, lungs clear, heart RRR with a 4/6 harsh systolic murmur at the RUSB, abdomen soft, NT, extremities no edema. Labs and radiographic data reviewed. No further nausea or vomiting. Eager for TAVR tomorrow. Will start back on ASA 81 mg daily. Hold diuretics today as her creatinine is slightly increased. HgB stable.  Pt on the schedule for 3rd case tomorrow. All questions answered.   [name], M.D. 03/28/2017 10:14 AM

## 2017-03-28 NOTE — H&P (View-Only) (Signed)
[facility] VALVE TEAM  Patient Name: Sonya Carpenter Date of Encounter: 03/28/2017  Primary Cardiologist: Dr.Zachary ([place] [place], [place]) / Dr. Burt Knack & Dr. Roxy Manns (TAVR)   Hospital Problem List     Principal Problem:   Severe aortic stenosis Active Problems:   CKD (chronic kidney disease) stage 4, GFR 15-29 ml/min ([place])   Hypertension   Type 2 diabetes mellitus without complication, with long-term current use of insulin (HCC)   Anemia, iron deficiency   Acute blood loss anemia   Mitral stenosis   Acute on chronic diastolic heart failure (HCC)   Pressure injury of skin     Subjective   Feels pretty good today. Cough is better. No more n/v. Anxious to get TAVR tomorrow.   Inpatient Medications    Scheduled Meds: . atorvastatin  20 mg Oral Daily  . ferrous sulfate  325 mg Oral BID  . insulin aspart  0-15 Units Subcutaneous TID WC  . insulin glargine  10 Units Subcutaneous Daily  . loratadine  10 mg Oral Daily  . mouth rinse  15 mL Mouth Rinse BID  . mometasone-formoterol  2 puff Inhalation BID  . montelukast  10 mg Oral QHS  . protein supplement shake  11 oz Oral TID BM  . sodium chloride flush  3 mL Intravenous Q12H   Continuous Infusions: . sodium chloride    . sodium chloride     PRN Meds: sodium chloride, Place/Maintain arterial line **AND** sodium chloride, acetaminophen, albuterol, docusate sodium, guaiFENesin-codeine, lubiprostone, ondansetron (ZOFRAN) IV, sodium chloride flush, sodium chloride flush   Vital Signs    Vitals:   03/27/17 1946 03/27/17 2003 03/28/17 0000 03/28/17 0444  BP: 104/63  (!) 94/58 120/61  Pulse: 89   83  Resp: (!) 26  19 15   Temp: 99.2 F (37.3 C)   98.2 F (36.8 C)  TempSrc: Oral   Oral  SpO2:  96%    Weight:    107 lb 5.8 oz (48.7 kg)  Height:        Intake/Output Summary (Last 24 hours) at 03/28/2017 0901 Last data filed at 03/28/2017 0603 Gross per 24 hour  Intake 723 ml  Output 1800  ml  Net -1077 ml   Filed Weights   03/26/17 0623 03/27/17 0329 03/28/17 0444  Weight: 106 lb 4.2 oz (48.2 kg) 101 lb 6.4 oz (46 kg) 107 lb 5.8 oz (48.7 kg)    Physical Exam   General: chronically ill appearing.  HEENT: normal Lymph: no adenopathy Neck: no JVD Endocrine:  No thryomegaly Vascular: No carotid bruits; FA pulses 2+ bilaterally without bruits  Cardiac:  normal S1, S2; RRR; 4/6 SEM @ RUSB Lungs:  clear to auscultation bilaterally. Few, faint wheezing. No rhonchi or rales  Abd: soft, nontender, no hepatomegaly  Ext: no edema Musculoskeletal:  No deformities, BUE and BLE strength normal and equal Skin: warm and dry. Healing hematomas around mouth and neck Neuro:  CNs 2-12 intact, no focal abnormalities noted Psych:  Normal affect    Labs    CBC Recent Labs    03/27/17 0614 03/28/17 0435  WBC 4.5 4.5  HGB 9.3* 9.1*  HCT 28.7* 28.3*  MCV 89.7 89.8  PLT 142* 017*   Basic Metabolic Panel Recent Labs    03/27/17 1331 03/28/17 0435  NA 137 137  K 4.9 4.8  CL 103 103  CO2 27 27  GLUCOSE 208* 291*  BUN 64* 70*  CREATININE 2.38* 2.54*  CALCIUM 9.5 9.7   Liver Function Tests Recent Labs    03/27/17 1331  AST 22  ALT 22  ALKPHOS 77  BILITOT 0.6  PROT 6.2*  ALBUMIN 2.9*   Recent Labs    03/27/17 1331  AMYLASE 141*   Cardiac Enzymes No results for input(s): CKTOTAL, CKMB, CKMBINDEX, TROPONINI in the last 72 hours. BNP Invalid input(s): POCBNP D-Dimer No results for input(s): DDIMER in the last 72 hours. Hemoglobin A1C No results for input(s): HGBA1C in the last 72 hours. Fasting Lipid Panel No results for input(s): CHOL, HDL, LDLCALC, TRIG, CHOLHDL, LDLDIRECT in the last 72 hours. Thyroid Function Tests No results for input(s): TSH, T4TOTAL, T3FREE, THYROIDAB in the last 72 hours.  Invalid input(s): FREET3  Telemetry    sinus - Personally Reviewed  ECG   None this admission   Radiology    No results found.  Cardiac Studies    2D ECHO: 03/07/2017 Study Conclusions - Left ventricle: The cavity size was normal. There was moderate concentric hypertrophy. Systolic function was vigorous. The estimated ejection fraction was in the range of 65% to 70%. Wall motion was normal; there were no regional wall motion abnormalities. Doppler parameters are consistent with abnormal left ventricular relaxation (grade 1 diastolic dysfunction). Doppler parameters are consistent with both elevated ventricular end-diastolic filling pressure and elevated left atrial filling pressure. - Aortic valve: Trileaflet; severely thickened, severely calcified leaflets. Valve mobility was restricted. There was trivial regurgitation. Mean gradient (S): 91 mm Hg. Peak gradient (S): 131 mm Hg. - Ascending aorta: The ascending aorta was normal in size. - Mitral valve: Calcified annulus. Severely thickened, severely calcified leaflets . Mobility was restricted. The findings are consistent with severe stenosis. There was mild regurgitation. Peak gradient (D): 23 mm Hg. - Left atrium: The atrium was severely dilated. - Right ventricle: The cavity size was normal. Wall thickness was normal. Systolic function was normal. - Pulmonary arteries: Systolic pressure was within the normal range. - Inferior vena cava: The vessel was normal in size. Impressions: - Normal LVEF. Severely elevated LVEDP. Critical aortic stenosis with peak/mean transaortic gradients 131/91 mmHg. Severe mitral stenosis with mean gradient 11 mmHg.  R/LHC 02/09/17 Conclusion    Mid RCA to Dist RCA lesion, 40 %stenosed.  Prox RCA to Mid RCA lesion, 20 %stenosed.  Ost Cx to Prox Cx lesion, 70 %stenosed.  Ost 3rd Mrg to 3rd Mrg lesion, 40 %stenosed.  Prox LAD to Mid LAD lesion, 30 %stenosed.  Dist LAD lesion, 20 %stenosed.  Ost 1st Diag lesion, 40 %stenosed.  Ost 2nd Diag to 2nd Diag lesion, 30 %stenosed.  There is severe  aortic valve stenosis.  1. Single vessel CAD with moderately severe, calcified stenosis in the proximal Circumflex artery 2. Mild non-obstructive disease in the RCA and LAD 3. Severe aortic stenosis (mean gradient 18.62mmHg, peak gradient 35 mmHg, AVA 1.26 cm2 ).  Recommendations: Will review films with the TAVR team. We may be able to manage her CAD medically. Will continue workup for TAVR.     Patient Profile     Sonya Carpenter is a 71 y.o. 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 The patient appeared weak and pale prior to surgery so STAT labs were ordered which revealed acute blood loss anemia and AKI. She was admitted for further  observation and work up.    Assessment & Plan    Acute blood loss anemia: HgB 5.4 on admission (down from 9.9) mg/dL last week. Likely 2/2 dental extractions with significant blood loss. No further blood loss since 11/26. She received 2 U PRBCS on 11/27. Hg has remained stable around 9.1 since transfusion.  Acute on chronic diastolic CHF: 2/2 volume resuscitation with IVF and blood products. She was given several doses of IV lasix. She appears euvolemic today.   AKI: creatinine 3.02 on admission (up from 2.39) mg/dL last week. Likely from hypovolemia in the setting of acute blood loss. Creat stable at 2.54 today. Continue holding all nephrotxic agents.   Severe AS: TAVR cancelled last Tuesday given AKI and acute anemia. She has remained inpatient for close monitoring. Hg and creat have stabilized. Will plan for TAVR tomorrow. Will add back ASA 81mg  daily.  DMT2: continue SSI. Diabetic coordinator recommend adding Lantus 10-15 units daily, which was added.   Cough: this has improved with IV lasix and cough  syrup.   Vomiting: this has resolved. Mildly elevated Amylase  Dispo: she has very little social support at home and very weak and frail. She will need to go to SNF at discharge.   SignedAngelena Form, PA-C  03/28/2017, 9:01 AM  Pager [phone]  Patient seen, examined. Available data reviewed. Agree with findings, assessment, and plan as outlined by Nell Range, PA-C. Exam shows a chronically ill-appearing woman in NAD, lungs clear, heart RRR with a 4/6 harsh systolic murmur at the RUSB, abdomen soft, NT, extremities no edema. Labs and radiographic data reviewed. No further nausea or vomiting. Eager for TAVR tomorrow. Will start back on ASA 81 mg daily. Hold diuretics today as her creatinine is slightly increased. HgB stable.  Pt on the schedule for 3rd case tomorrow. All questions answered.   [name], M.D. 03/28/2017 10:14 AM

## 2017-03-28 NOTE — Progress Notes (Signed)
Inpatient Diabetes Program Recommendations  AACE/ADA: New Consensus Statement on Inpatient Glycemic Control (2015)  Target Ranges:  Prepandial:   less than 140 mg/dL      Peak postprandial:   less than 180 mg/dL (1-2 hours)      Critically ill patients:  140 - 180 mg/dL   Results for Sonya Carpenter, Sonya Carpenter (MRN [id number]) as of 03/28/2017 10:03  Ref. Range 03/27/2017 10:10 03/27/2017 11:45 03/27/2017 16:31 03/27/2017 21:02 03/28/2017 06:02  Glucose-Capillary Latest Ref Range: 65 - 99 mg/dL 208 (H) 178 (H) 136 (H) 242 (H) 275 (H)   Review of Glycemic Control  Diabetes history: DM2 Outpatient Diabetes medications: Lantus 30 units QHS Current orders for Inpatient glycemic control: Lantus 10 units Daily, Novolog 0-15 units TID with meals  Inpatient Diabetes Program Recommendations:    Glucose elevated this am. Please consider increasing Lantus to 15 units, half of patient's home dose.  Thanks, Tama Headings RN, MSN, [facility] Inpatient Diabetes Coordinator Team Pager [phone] (8a-5p)

## 2017-03-28 NOTE — Progress Notes (Signed)
Physical Therapy Treatment Patient Details Name: Sonya Carpenter MRN: [id number] DOB: 03/10/1946 Today's Date: 03/28/2017    History of Present Illness Pt presented to [facility] for planned TAVR but found to have acute blood loss anemia and AKI. TAVR put on hold. PMH -  aortic stenosis, copd, chf, htn, dm    PT Comments    Pt functioning at min guard level at this time however is planned to have a TAVR on 12/4. Pt lives alone and will most likely need SNF s/p TAVR due to suspected onset of weakness and deconditioning from the procedure in addition to the precautions. PT to re-assess s/p procedure as appropriate.   Follow Up Recommendations  SNF     Equipment Recommendations  None recommended by PT    Recommendations for Other Services       Precautions / Restrictions Precautions Precautions: Fall Restrictions Weight Bearing Restrictions: No    Mobility  Bed Mobility               General bed mobility comments: pt up in chair  Transfers Overall transfer level: Needs assistance Equipment used: Rolling walker (2 wheeled) Transfers: Sit to/from Stand Sit to Stand: Min guard         General transfer comment: pt with good hand placement, no physical assist needed, increased time  Ambulation/Gait Ambulation/Gait assistance: Min guard Ambulation Distance (Feet): 300 Feet Assistive device: Rolling walker (2 wheeled) Gait Pattern/deviations: Step-through pattern;Decreased stride length;Trunk flexed Gait velocity: decr Gait velocity interpretation: Below normal speed for age/gender General Gait Details: Assist for safety. No loss of balance. Amb on RA with VSS.   Stairs            Wheelchair Mobility    Modified Rankin (Stroke Patients Only)       Balance Overall balance assessment: Needs assistance Sitting-balance support: Feet supported;No upper extremity supported Sitting balance-Leahy Scale: Good     Standing balance support: Single extremity  supported Standing balance-Leahy Scale: Poor Standing balance comment: UE support and min guard for static standing                            Cognition Arousal/Alertness: Awake/alert Behavior During Therapy: WFL for tasks assessed/performed Overall Cognitive Status: Within Functional Limits for tasks assessed                                        Exercises      General Comments        Pertinent Vitals/Pain Pain Assessment: No/denies pain    Home Living                      Prior Function            PT Goals (current goals can now be found in the care plan section) Acute Rehab PT Goals Patient Stated Goal: Return home Progress towards PT goals: Progressing toward goals    Frequency    Min 3X/week      PT Plan Current plan remains appropriate    Co-evaluation              AM-PAC PT "6 Clicks" Daily Activity  Outcome Measure  Difficulty turning over in bed (including adjusting bedclothes, sheets and blankets)?: A Little Difficulty moving from lying on back to sitting on the side of the bed? : A  Little Difficulty sitting down on and standing up from a chair with arms (e.g., wheelchair, bedside commode, etc,.)?: Unable Help needed moving to and from a bed to chair (including a wheelchair)?: A Little Help needed walking in hospital room?: A Little Help needed climbing 3-5 steps with a railing? : A Little 6 Click Score: 16    End of Session Equipment Utilized During Treatment: Gait belt Activity Tolerance: Patient tolerated treatment well Patient left: in chair;with call bell/phone within reach;with chair alarm set Nurse Communication: Mobility status PT Visit Diagnosis: Unsteadiness on feet (R26.81);Other abnormalities of gait and mobility (R26.89);Muscle weakness (generalized) (M62.81)     Time: 4158-3094 PT Time Calculation (min) (ACUTE ONLY): 19 min  Charges:  $Gait Training: 8-22 mins                    G  Codes:       Kittie Plater, PT, DPT Pager #: [phone] Office #: [phone]    [place] 03/28/2017, 2:27 PM

## 2017-03-28 NOTE — Progress Notes (Signed)
CSW spoke with [facility] rehab regarding pt admission- they anticipate having a bed for this pt pending insurance auth  CSW inquired about starting auth today since would likely be ready next few days after procedure tomorrow- they would need updated PT note today to start auth since last note was from Friday  CSW requested PT to see pt today so we can initiate auth   CSW will continue to follow  Jorge Ny, [place] Social Worker [phone]

## 2017-03-29 ENCOUNTER — Encounter (HOSPITAL_COMMUNITY)
Admission: RE | Disposition: A | Discharge status: Skilled Nursing Facility | Payer: Self-pay | Source: Ambulatory Visit | Attending: Cardiovascular Disease

## 2017-03-29 ENCOUNTER — Encounter (HOSPITAL_COMMUNITY): Payer: Self-pay | Admitting: Anesthesiology

## 2017-03-29 ENCOUNTER — Inpatient Hospital Stay (HOSPITAL_COMMUNITY): Payer: Medicare HMO | Admitting: Certified Registered Nurse Anesthetist

## 2017-03-29 ENCOUNTER — Other Ambulatory Visit: Payer: Self-pay

## 2017-03-29 ENCOUNTER — Inpatient Hospital Stay (HOSPITAL_COMMUNITY): Payer: Medicare HMO | Admitting: Vascular Surgery

## 2017-03-29 ENCOUNTER — Inpatient Hospital Stay (HOSPITAL_COMMUNITY): Payer: Medicare HMO

## 2017-03-29 ENCOUNTER — Inpatient Hospital Stay (HOSPITAL_COMMUNITY): Admission: RE | Admit: 2017-03-29 | Payer: Medicare HMO | Source: Ambulatory Visit | Admitting: Cardiovascular Disease

## 2017-03-29 DIAGNOSIS — Z952 Presence of prosthetic heart valve: Secondary | ICD-10-CM

## 2017-03-29 DIAGNOSIS — I350 Nonrheumatic aortic (valve) stenosis: Secondary | ICD-10-CM

## 2017-03-29 DIAGNOSIS — Z006 Encounter for examination for normal comparison and control in clinical research program: Secondary | ICD-10-CM

## 2017-03-29 HISTORY — DX: Presence of prosthetic heart valve: Z95.2

## 2017-03-29 HISTORY — PX: TEE WITHOUT CARDIOVERSION: SHX5443

## 2017-03-29 HISTORY — PX: TRANSCATHETER AORTIC VALVE REPLACEMENT, TRANSFEMORAL: SHX6400

## 2017-03-29 LAB — CBC
HEMATOCRIT: 27.2 % — AB (ref 36.0–46.0)
HEMATOCRIT: 28.5 % — AB (ref 36.0–46.0)
HEMOGLOBIN: 8.7 g/dL — AB (ref 12.0–15.0)
HEMOGLOBIN: 9.1 g/dL — AB (ref 12.0–15.0)
MCH: 28.7 pg (ref 26.0–34.0)
MCH: 28.6 pg (ref 26.0–34.0)
MCHC: 32 g/dL (ref 30.0–36.0)
MCHC: 31.9 g/dL (ref 30.0–36.0)
MCV: 89.8 fL (ref 78.0–100.0)
MCV: 89.6 fL (ref 78.0–100.0)
Platelets: 141 10*3/uL — ABNORMAL LOW (ref 150–400)
Platelets: 145 10*3/uL — ABNORMAL LOW (ref 150–400)
RBC: 3.03 MIL/uL — AB (ref 3.87–5.11)
RBC: 3.18 MIL/uL — ABNORMAL LOW (ref 3.87–5.11)
RDW: 14.7 % (ref 11.5–15.5)
RDW: 14.5 % (ref 11.5–15.5)
WBC: 4.7 10*3/uL (ref 4.0–10.5)
WBC: 7 10*3/uL (ref 4.0–10.5)

## 2017-03-29 LAB — GLUCOSE, CAPILLARY
GLUCOSE-CAPILLARY: 125 mg/dL — AB (ref 65–99)
GLUCOSE-CAPILLARY: 124 mg/dL — AB (ref 65–99)
GLUCOSE-CAPILLARY: 202 mg/dL — AB (ref 65–99)
Glucose-Capillary: 93 mg/dL (ref 65–99)
Glucose-Capillary: 114 mg/dL — ABNORMAL HIGH (ref 65–99)
Glucose-Capillary: 179 mg/dL — ABNORMAL HIGH (ref 65–99)

## 2017-03-29 LAB — POCT I-STAT, CHEM 8
BUN: 73 mg/dL — AB (ref 6–20)
BUN: 65 mg/dL — AB (ref 6–20)
CHLORIDE: 104 mmol/L (ref 101–111)
CHLORIDE: 107 mmol/L (ref 101–111)
CREATININE: 2.3 mg/dL — AB (ref 0.44–1.00)
CREATININE: 1.9 mg/dL — AB (ref 0.44–1.00)
Calcium, Ion: 1.35 mmol/L (ref 1.15–1.40)
Calcium, Ion: 1.27 mmol/L (ref 1.15–1.40)
GLUCOSE: 150 mg/dL — AB (ref 65–99)
Glucose, Bld: 116 mg/dL — ABNORMAL HIGH (ref 65–99)
HEMATOCRIT: 27 % — AB (ref 36.0–46.0)
HEMATOCRIT: 24 % — AB (ref 36.0–46.0)
HEMOGLOBIN: 8.2 g/dL — AB (ref 12.0–15.0)
Hemoglobin: 9.2 g/dL — ABNORMAL LOW (ref 12.0–15.0)
POTASSIUM: 4.5 mmol/L (ref 3.5–5.1)
POTASSIUM: 4.4 mmol/L (ref 3.5–5.1)
SODIUM: 141 mmol/L (ref 135–145)
Sodium: 139 mmol/L (ref 135–145)
TCO2: 23 mmol/L (ref 22–32)
TCO2: 25 mmol/L (ref 22–32)

## 2017-03-29 LAB — PROTIME-INR
INR: 1.31
Prothrombin Time: 16.2 seconds — ABNORMAL HIGH (ref 11.4–15.2)

## 2017-03-29 LAB — POCT I-STAT 3, ART BLOOD GAS (G3+)
ACID-BASE DEFICIT: 4 mmol/L — AB (ref 0.0–2.0)
Bicarbonate: 22.5 mmol/L (ref 20.0–28.0)
O2 SAT: 99 %
PCO2 ART: 45.1 mmHg (ref 32.0–48.0)
TCO2: 24 mmol/L (ref 22–32)
pH, Arterial: 7.306 — ABNORMAL LOW (ref 7.350–7.450)
pO2, Arterial: 150 mmHg — ABNORMAL HIGH (ref 83.0–108.0)

## 2017-03-29 LAB — POCT I-STAT 4, (NA,K, GLUC, HGB,HCT)
GLUCOSE: 159 mg/dL — AB (ref 65–99)
HEMATOCRIT: 23 % — AB (ref 36.0–46.0)
Hemoglobin: 7.8 g/dL — ABNORMAL LOW (ref 12.0–15.0)
Potassium: 4.8 mmol/L (ref 3.5–5.1)
SODIUM: 141 mmol/L (ref 135–145)

## 2017-03-29 LAB — BASIC METABOLIC PANEL
ANION GAP: 11 (ref 5–15)
BUN: 80 mg/dL — ABNORMAL HIGH (ref 6–20)
CO2: 25 mmol/L (ref 22–32)
Calcium: 9.6 mg/dL (ref 8.9–10.3)
Chloride: 99 mmol/L — ABNORMAL LOW (ref 101–111)
Creatinine, Ser: 2.41 mg/dL — ABNORMAL HIGH (ref 0.44–1.00)
GFR calc Af Amer: 22 mL/min — ABNORMAL LOW (ref >60)
GFR, EST NON AFRICAN AMERICAN: 19 mL/min — AB (ref >60)
Glucose, Bld: 104 mg/dL — ABNORMAL HIGH (ref 65–99)
POTASSIUM: 5.3 mmol/L — AB (ref 3.5–5.1)
SODIUM: 135 mmol/L (ref 135–145)

## 2017-03-29 LAB — HEMOGLOBIN AND HEMATOCRIT, BLOOD
HCT: 33.1 % — ABNORMAL LOW (ref 36.0–46.0)
HEMOGLOBIN: 10.7 g/dL — AB (ref 12.0–15.0)

## 2017-03-29 LAB — APTT: aPTT: 29 seconds (ref 24–36)

## 2017-03-29 LAB — PREPARE RBC (CROSSMATCH)

## 2017-03-29 SURGERY — IMPLANTATION, AORTIC VALVE, TRANSCATHETER, FEMORAL APPROACH
Anesthesia: General | Site: Chest

## 2017-03-29 MED ORDER — FENTANYL CITRATE (PF) 100 MCG/2ML IJ SOLN
25.0000 ug | Freq: Once | INTRAMUSCULAR | Status: AC
  Administered 2017-03-29: 25 ug via INTRAVENOUS
  Filled 2017-03-29: qty 0.5

## 2017-03-29 MED ORDER — INSULIN ASPART 100 UNIT/ML ~~LOC~~ SOLN
0.0000 [IU] | SUBCUTANEOUS | Status: DC
  Administered 2017-03-29: 8 [IU] via SUBCUTANEOUS
  Administered 2017-03-29 – 2017-03-30 (×3): 4 [IU] via SUBCUTANEOUS
  Administered 2017-03-30 (×2): 20 [IU] via SUBCUTANEOUS
  Administered 2017-03-31: 8 [IU] via SUBCUTANEOUS
  Administered 2017-03-31: 2 [IU] via SUBCUTANEOUS
  Administered 2017-03-31: 4 [IU] via SUBCUTANEOUS
  Administered 2017-04-01: 8 [IU] via SUBCUTANEOUS
  Administered 2017-04-01: 12 [IU] via SUBCUTANEOUS
  Administered 2017-04-02: 2 [IU] via SUBCUTANEOUS

## 2017-03-29 MED ORDER — LUBIPROSTONE 8 MCG PO CAPS
8.0000 ug | ORAL_CAPSULE | Freq: Two times a day (BID) | ORAL | Status: DC | PRN
  Filled 2017-03-29: qty 1

## 2017-03-29 MED ORDER — INSULIN ASPART 100 UNIT/ML ~~LOC~~ SOLN: 0.0000 [IU] | SUBCUTANEOUS | Status: DC

## 2017-03-29 MED ORDER — ENSURE ENLIVE PO LIQD
237.0000 mL | Freq: Two times a day (BID) | ORAL | Status: DC
  Administered 2017-03-30 – 2017-04-02 (×3): 237 mL via ORAL

## 2017-03-29 MED ORDER — FAMOTIDINE IN NACL 20-0.9 MG/50ML-% IV SOLN
20.0000 mg | Freq: Two times a day (BID) | INTRAVENOUS | Status: AC
  Administered 2017-03-29 – 2017-03-30 (×2): 20 mg via INTRAVENOUS
  Filled 2017-03-29 (×2): qty 50

## 2017-03-29 MED ORDER — FENTANYL CITRATE (PF) 100 MCG/2ML IJ SOLN: 25.0000 ug | INTRAMUSCULAR | Status: DC | PRN

## 2017-03-29 MED ORDER — HEPARIN SODIUM (PORCINE) 1000 UNIT/ML IJ SOLN
INTRAMUSCULAR | Status: DC | PRN
  Administered 2017-03-29: 7000 [IU] via INTRAVENOUS

## 2017-03-29 MED ORDER — ACETAMINOPHEN 160 MG/5ML PO SOLN: 1000.0000 mg | Freq: Four times a day (QID) | ORAL | Status: DC

## 2017-03-29 MED ORDER — ASPIRIN 81 MG PO CHEW: 81.0000 mg | CHEWABLE_TABLET | Freq: Every day | ORAL | Status: DC

## 2017-03-29 MED ORDER — DEXTROSE 5 % IV SOLN
1.5000 g | INTRAVENOUS | Status: AC
  Administered 2017-03-29 – 2017-03-30 (×2): 1.5 g via INTRAVENOUS
  Filled 2017-03-29 (×2): qty 1.5

## 2017-03-29 MED ORDER — METOPROLOL TARTRATE 5 MG/5ML IV SOLN: 2.5000 mg | INTRAVENOUS | Status: DC | PRN

## 2017-03-29 MED ORDER — DEXAMETHASONE SODIUM PHOSPHATE 10 MG/ML IJ SOLN
INTRAMUSCULAR | Status: DC | PRN
  Administered 2017-03-29: 10 mg via INTRAVENOUS

## 2017-03-29 MED ORDER — ROCURONIUM BROMIDE 100 MG/10ML IV SOLN
INTRAVENOUS | Status: DC | PRN
  Administered 2017-03-29: 50 mg via INTRAVENOUS

## 2017-03-29 MED ORDER — 0.9 % SODIUM CHLORIDE (POUR BTL) OPTIME
TOPICAL | Status: DC | PRN
  Administered 2017-03-29: 3000 mL

## 2017-03-29 MED ORDER — SODIUM CHLORIDE 0.9 % IV SOLN: Freq: Once | INTRAVENOUS | Status: DC

## 2017-03-29 MED ORDER — PROTAMINE SULFATE 10 MG/ML IV SOLN
INTRAVENOUS | Status: DC | PRN
  Administered 2017-03-29: 20 mg via INTRAVENOUS
  Administered 2017-03-29: 50 mg via INTRAVENOUS

## 2017-03-29 MED ORDER — SODIUM CHLORIDE 0.9 % IV SOLN
INTRAVENOUS | Status: DC
  Administered 2017-03-29 – 2017-04-01 (×2): via INTRAVENOUS

## 2017-03-29 MED ORDER — DEXTROSE 5 % IV SOLN
1.5000 g | Freq: Two times a day (BID) | INTRAVENOUS | Status: DC
  Filled 2017-03-29: qty 1.5

## 2017-03-29 MED ORDER — PANTOPRAZOLE SODIUM 40 MG PO TBEC
40.0000 mg | DELAYED_RELEASE_TABLET | Freq: Every day | ORAL | Status: DC
  Administered 2017-03-31: 40 mg via ORAL
  Filled 2017-03-29: qty 1

## 2017-03-29 MED ORDER — CLOPIDOGREL BISULFATE 75 MG PO TABS
75.0000 mg | ORAL_TABLET | Freq: Every day | ORAL | Status: DC
  Administered 2017-03-30 – 2017-03-31 (×2): 75 mg via ORAL
  Filled 2017-03-29 (×3): qty 1

## 2017-03-29 MED ORDER — ATORVASTATIN CALCIUM 20 MG PO TABS
20.0000 mg | ORAL_TABLET | Freq: Every day | ORAL | Status: DC
  Administered 2017-03-29 – 2017-04-02 (×4): 20 mg via ORAL
  Filled 2017-03-29 (×4): qty 1

## 2017-03-29 MED ORDER — MONTELUKAST SODIUM 10 MG PO TABS
10.0000 mg | ORAL_TABLET | Freq: Every day | ORAL | Status: DC
  Administered 2017-03-29 – 2017-04-01 (×4): 10 mg via ORAL
  Filled 2017-03-29 (×4): qty 1

## 2017-03-29 MED ORDER — INSULIN GLARGINE 100 UNIT/ML ~~LOC~~ SOLN
30.0000 [IU] | Freq: Every day | SUBCUTANEOUS | Status: DC
Start: 2017-03-30 — End: 2017-03-29

## 2017-03-29 MED ORDER — LACTATED RINGERS IV SOLN
INTRAVENOUS | Status: DC | PRN
  Administered 2017-03-29: 14:00:00 via INTRAVENOUS

## 2017-03-29 MED ORDER — FENTANYL CITRATE (PF) 250 MCG/5ML IJ SOLN
INTRAMUSCULAR | Status: AC
  Filled 2017-03-29: qty 5

## 2017-03-29 MED ORDER — PROPOFOL 10 MG/ML IV BOLUS
INTRAVENOUS | Status: DC | PRN
  Administered 2017-03-29: 110 mg via INTRAVENOUS

## 2017-03-29 MED ORDER — IODIXANOL 320 MG/ML IV SOLN
INTRAVENOUS | Status: DC | PRN
  Administered 2017-03-29: 35.8 mL via INTRAVENOUS

## 2017-03-29 MED ORDER — LACTATED RINGERS IV SOLN: 500.0000 mL | Freq: Once | INTRAVENOUS | Status: DC | PRN

## 2017-03-29 MED ORDER — SODIUM CHLORIDE 0.9 % IV SOLN
INTRAVENOUS | Status: DC | PRN
  Administered 2017-03-29: 16:00:00 1500 mL

## 2017-03-29 MED ORDER — TRAMADOL HCL 50 MG PO TABS
50.0000 mg | ORAL_TABLET | ORAL | Status: DC | PRN
  Administered 2017-03-30 – 2017-04-01 (×2): 50 mg via ORAL
  Filled 2017-03-29 (×2): qty 1

## 2017-03-29 MED ORDER — LACTATED RINGERS IV SOLN
INTRAVENOUS | Status: DC | PRN
Start: 2017-03-29 — End: 2017-03-29
  Administered 2017-03-29: 14:00:00 via INTRAVENOUS

## 2017-03-29 MED ORDER — MOMETASONE FURO-FORMOTEROL FUM 200-5 MCG/ACT IN AERO
2.0000 | INHALATION_SPRAY | Freq: Two times a day (BID) | RESPIRATORY_TRACT | Status: DC
  Administered 2017-03-30 – 2017-04-02 (×6): 2 via RESPIRATORY_TRACT
  Filled 2017-03-29 (×2): qty 8.8

## 2017-03-29 MED ORDER — FENTANYL CITRATE (PF) 250 MCG/5ML IJ SOLN
INTRAMUSCULAR | Status: DC | PRN
  Administered 2017-03-29: 150 ug via INTRAVENOUS
  Administered 2017-03-29: 50 ug via INTRAVENOUS

## 2017-03-29 MED ORDER — SODIUM CHLORIDE 0.9 % IV SOLN
0.0000 ug/min | INTRAVENOUS | Status: DC
  Administered 2017-03-29: 5 ug/min via INTRAVENOUS
  Filled 2017-03-29: qty 2

## 2017-03-29 MED ORDER — PROTAMINE SULFATE 10 MG/ML IV SOLN
INTRAVENOUS | Status: AC
  Filled 2017-03-29: qty 10

## 2017-03-29 MED ORDER — LORATADINE 10 MG PO TABS
10.0000 mg | ORAL_TABLET | Freq: Every day | ORAL | Status: DC
  Administered 2017-03-30 – 2017-04-02 (×3): 10 mg via ORAL
  Filled 2017-03-29 (×3): qty 1

## 2017-03-29 MED ORDER — ASPIRIN EC 81 MG PO TBEC
81.0000 mg | DELAYED_RELEASE_TABLET | Freq: Every day | ORAL | Status: DC
  Administered 2017-03-30 – 2017-03-31 (×2): 81 mg via ORAL
  Filled 2017-03-29 (×2): qty 1

## 2017-03-29 MED ORDER — FERROUS SULFATE 325 (65 FE) MG PO TABS
325.0000 mg | ORAL_TABLET | Freq: Two times a day (BID) | ORAL | Status: DC
  Administered 2017-03-29 – 2017-04-02 (×7): 325 mg via ORAL
  Filled 2017-03-29 (×7): qty 1

## 2017-03-29 MED ORDER — MIDAZOLAM HCL 2 MG/2ML IJ SOLN
INTRAMUSCULAR | Status: AC
  Filled 2017-03-29: qty 2

## 2017-03-29 MED ORDER — MIDAZOLAM HCL 2 MG/2ML IJ SOLN: 2.0000 mg | INTRAMUSCULAR | Status: DC | PRN

## 2017-03-29 MED ORDER — LIDOCAINE 2% (20 MG/ML) 5 ML SYRINGE
INTRAMUSCULAR | Status: DC | PRN
  Administered 2017-03-29: 60 mg via INTRAVENOUS

## 2017-03-29 MED ORDER — DEXTROSE 5 % IV SOLN
0.0000 ug/min | INTRAVENOUS | Status: DC
  Filled 2017-03-29: qty 4

## 2017-03-29 MED ORDER — OXYCODONE HCL 5 MG PO TABS: 5.0000 mg | ORAL_TABLET | ORAL | Status: DC | PRN

## 2017-03-29 MED ORDER — INSULIN ASPART 100 UNIT/ML ~~LOC~~ SOLN: 0.0000 [IU] | Freq: Three times a day (TID) | SUBCUTANEOUS | Status: DC

## 2017-03-29 MED ORDER — DOCUSATE SODIUM 100 MG PO CAPS: 100.0000 mg | ORAL_CAPSULE | Freq: Two times a day (BID) | ORAL | Status: DC | PRN

## 2017-03-29 MED ORDER — VANCOMYCIN HCL IN DEXTROSE 1-5 GM/200ML-% IV SOLN: 1000.0000 mg | Freq: Once | INTRAVENOUS | Status: DC

## 2017-03-29 MED ORDER — SUCCINYLCHOLINE CHLORIDE 20 MG/ML IJ SOLN
INTRAMUSCULAR | Status: DC | PRN
  Administered 2017-03-29: 100 mg via INTRAVENOUS

## 2017-03-29 MED ORDER — ALBUMIN HUMAN 5 % IV SOLN: 250.0000 mL | INTRAVENOUS | Status: DC | PRN

## 2017-03-29 MED ORDER — ONDANSETRON HCL 4 MG/2ML IJ SOLN
INTRAMUSCULAR | Status: DC | PRN
  Administered 2017-03-29: 4 mg via INTRAVENOUS

## 2017-03-29 MED ORDER — ACETAMINOPHEN 500 MG PO TABS
1000.0000 mg | ORAL_TABLET | Freq: Four times a day (QID) | ORAL | Status: DC
  Administered 2017-03-29 – 2017-04-01 (×8): 1000 mg via ORAL
  Filled 2017-03-29 (×8): qty 2

## 2017-03-29 MED ORDER — NITROGLYCERIN IN D5W 200-5 MCG/ML-% IV SOLN: 0.0000 ug/min | INTRAVENOUS | Status: DC

## 2017-03-29 MED ORDER — MORPHINE SULFATE (PF) 4 MG/ML IV SOLN: 2.0000 mg | INTRAVENOUS | Status: DC | PRN

## 2017-03-29 MED ORDER — SUGAMMADEX SODIUM 200 MG/2ML IV SOLN
INTRAVENOUS | Status: DC | PRN
  Administered 2017-03-29: 100 mg via INTRAVENOUS

## 2017-03-29 MED ORDER — PREMIER PROTEIN SHAKE
11.0000 [oz_av] | Freq: Three times a day (TID) | ORAL | Status: DC
  Administered 2017-03-29 – 2017-03-31 (×4): 11 [oz_av] via ORAL
  Filled 2017-03-29 (×9): qty 325.31

## 2017-03-29 MED ORDER — DEXAMETHASONE SODIUM PHOSPHATE 10 MG/ML IJ SOLN
INTRAMUSCULAR | Status: AC
  Filled 2017-03-29: qty 1

## 2017-03-29 MED ORDER — FENTANYL CITRATE (PF) 100 MCG/2ML IJ SOLN
INTRAMUSCULAR | Status: AC
  Administered 2017-03-29: 25 ug via INTRAVENOUS
  Filled 2017-03-29: qty 2

## 2017-03-29 MED ORDER — ONDANSETRON HCL 4 MG/2ML IJ SOLN
4.0000 mg | Freq: Four times a day (QID) | INTRAMUSCULAR | Status: DC | PRN
  Administered 2017-03-31: 4 mg via INTRAVENOUS
  Filled 2017-03-29: qty 2

## 2017-03-29 SURGICAL SUPPLY — 108 items
ADAPTER UNIV SWAN GANZ BIP (ADAPTER) ×1 IMPLANT
ADAPTER UNV SWAN GANZ BIP (ADAPTER) ×2
ATTRACTOMAT 16X20 MAGNETIC DRP (DRAPES) IMPLANT
BAG BANDED W/RUBBER/TAPE 36X54 (MISCELLANEOUS) ×3 IMPLANT
BAG DECANTER FOR FLEXI CONT (MISCELLANEOUS) ×3 IMPLANT
BAG SNAP BAND KOVER 36X36 (MISCELLANEOUS) ×6 IMPLANT
BLADE 10 SAFETY STRL DISP (BLADE) IMPLANT
BLADE CLIPPER SURG (BLADE) IMPLANT
BLADE STERNUM SYSTEM 6 (BLADE) IMPLANT
CABLE ADAPT CONN TEMP 6FT (ADAPTER) ×3 IMPLANT
CABLE PACING FASLOC BIEGE (MISCELLANEOUS) ×3 IMPLANT
CABLE PACING FASLOC BLUE (MISCELLANEOUS) ×3 IMPLANT
CANISTER SUCT 3000ML PPV (MISCELLANEOUS) IMPLANT
CANNULA FEM VENOUS REMOTE 22FR (CANNULA) IMPLANT
CANNULA OPTISITE PERFUSION 16F (CANNULA) IMPLANT
CANNULA OPTISITE PERFUSION 18F (CANNULA) IMPLANT
CATH CROSS OVER TEMPO 5F (CATHETERS) ×3 IMPLANT
CATH DIAG EXPO 6F VENT PIG 145 (CATHETERS) ×6 IMPLANT
CATH EXPO 5FR AL1 (CATHETERS) ×3 IMPLANT
CATH INFINITI 6F AL2 (CATHETERS) ×3 IMPLANT
CATH S G BIP PACING (SET/KITS/TRAYS/PACK) ×6 IMPLANT
CATH STRAIGHT 5FR 65CM (CATHETERS) ×3 IMPLANT
CLIP VESOCCLUDE MED 24/CT (CLIP) ×3 IMPLANT
CLIP VESOCCLUDE SM WIDE 24/CT (CLIP) ×3 IMPLANT
CONT SPEC 4OZ CLIKSEAL STRL BL (MISCELLANEOUS) ×6 IMPLANT
COVER BACK TABLE 60X90IN (DRAPES) ×3 IMPLANT
COVER BACK TABLE 80X110 HD (DRAPES) ×3 IMPLANT
COVER DOME SNAP 22 D (MISCELLANEOUS) ×3 IMPLANT
COVER MAYO STAND STRL (DRAPES) ×3 IMPLANT
COVER PROBE W GEL 5X96 (DRAPES) ×3 IMPLANT
CRADLE DONUT ADULT HEAD (MISCELLANEOUS) ×3 IMPLANT
DERMABOND ADVANCED (GAUZE/BANDAGES/DRESSINGS) ×2
DERMABOND ADVANCED .7 DNX12 (GAUZE/BANDAGES/DRESSINGS) ×1 IMPLANT
DEVICE CLOSURE PERCLS PRGLD 6F (VASCULAR PRODUCTS) ×2 IMPLANT
DEVICE TORQUE KENDALL .025-038 (MISCELLANEOUS) ×3 IMPLANT
DRAPE INCISE IOBAN 66X45 STRL (DRAPES) IMPLANT
DRAPE SLUSH MACHINE 52X66 (DRAPES) ×3 IMPLANT
DRSG TEGADERM 4X4.75 (GAUZE/BANDAGES/DRESSINGS) ×3 IMPLANT
ELECT REM PT RETURN 9FT ADLT (ELECTROSURGICAL) ×6
ELECTRODE REM PT RTRN 9FT ADLT (ELECTROSURGICAL) ×2 IMPLANT
FELT TEFLON 1X6 (MISCELLANEOUS) ×3 IMPLANT
FELT TEFLON 6X6 (MISCELLANEOUS) IMPLANT
FEMORAL VENOUS CANN RAP (CANNULA) IMPLANT
GAUZE SPONGE 4X4 12PLY STRL (GAUZE/BANDAGES/DRESSINGS) ×3 IMPLANT
GLOVE BIO SURGEON STRL SZ 6 (GLOVE) ×3 IMPLANT
GLOVE BIO SURGEON STRL SZ7.5 (GLOVE) ×3 IMPLANT
GLOVE BIO SURGEON STRL SZ8 (GLOVE) ×6 IMPLANT
GLOVE BIOGEL PI IND STRL 6.5 (GLOVE) ×4 IMPLANT
GLOVE BIOGEL PI INDICATOR 6.5 (GLOVE) ×8
GLOVE EUDERMIC 7 POWDERFREE (GLOVE) ×3 IMPLANT
GLOVE ORTHO TXT STRL SZ7.5 (GLOVE) ×3 IMPLANT
GOWN STRL REUS W/ TWL LRG LVL3 (GOWN DISPOSABLE) ×3 IMPLANT
GOWN STRL REUS W/ TWL XL LVL3 (GOWN DISPOSABLE) ×6 IMPLANT
GOWN STRL REUS W/TWL LRG LVL3 (GOWN DISPOSABLE) ×6
GOWN STRL REUS W/TWL XL LVL3 (GOWN DISPOSABLE) ×12
GUIDEWIRE ANGLED .035X150CM (WIRE) ×3 IMPLANT
GUIDEWIRE SAF TJ AMPL .035X180 (WIRE) ×3 IMPLANT
GUIDEWIRE SAFE TJ AMPLATZ EXST (WIRE) ×3 IMPLANT
GUIDEWIRE STRAIGHT .035 260CM (WIRE) ×3 IMPLANT
INSERT FOGARTY 61MM (MISCELLANEOUS) IMPLANT
INSERT FOGARTY SM (MISCELLANEOUS) IMPLANT
INSERT FOGARTY XLG (MISCELLANEOUS) IMPLANT
KIT BASIN OR (CUSTOM PROCEDURE TRAY) ×3 IMPLANT
KIT DILATOR VASC 18G NDL (KITS) IMPLANT
KIT HEART LEFT (KITS) ×3 IMPLANT
KIT ROOM TURNOVER OR (KITS) ×3 IMPLANT
KIT SUCTION CATH 14FR (SUCTIONS) ×6 IMPLANT
NEEDLE PERC 18GX7CM (NEEDLE) ×3 IMPLANT
NS IRRIG 1000ML POUR BTL (IV SOLUTION) ×9 IMPLANT
PACK AORTA (CUSTOM PROCEDURE TRAY) ×3 IMPLANT
PAD ARMBOARD 7.5X6 YLW CONV (MISCELLANEOUS) ×6 IMPLANT
PAD ELECT DEFIB RADIOL ZOLL (MISCELLANEOUS) ×3 IMPLANT
PATCH TACHOSII LRG 9.5X4.8 (VASCULAR PRODUCTS) IMPLANT
PERCLOSE PROGLIDE 6F (VASCULAR PRODUCTS) ×6
SET MICROPUNCTURE 5F STIFF (MISCELLANEOUS) ×3 IMPLANT
SHEATH AVANTI 11CM 8FR (MISCELLANEOUS) ×3 IMPLANT
SHEATH PINNACLE 6F 10CM (SHEATH) ×9 IMPLANT
SLEEVE REPOSITIONING LENGTH 30 (MISCELLANEOUS) ×3 IMPLANT
SPONGE LAP 4X18 X RAY DECT (DISPOSABLE) ×3 IMPLANT
STOPCOCK MORSE 400PSI 3WAY (MISCELLANEOUS) ×18 IMPLANT
SUT ETHIBOND X763 2 0 SH 1 (SUTURE) IMPLANT
SUT GORETEX CV 4 TH 22 36 (SUTURE) ×3 IMPLANT
SUT GORETEX CV4 TH-18 (SUTURE) ×6 IMPLANT
SUT GORETEX TH-18 36 INCH (SUTURE) IMPLANT
SUT MNCRL AB 3-0 PS2 18 (SUTURE) ×3 IMPLANT
SUT PROLENE 3 0 SH1 36 (SUTURE) IMPLANT
SUT PROLENE 4 0 RB 1 (SUTURE)
SUT PROLENE 4-0 RB1 .5 CRCL 36 (SUTURE) IMPLANT
SUT PROLENE 5 0 C 1 36 (SUTURE) ×3 IMPLANT
SUT PROLENE 6 0 C 1 30 (SUTURE) ×3 IMPLANT
SUT SILK  1 MH (SUTURE) ×2
SUT SILK 1 MH (SUTURE) ×1 IMPLANT
SUT SILK 2 0 SH CR/8 (SUTURE) IMPLANT
SUT VIC AB 2-0 CT1 27 (SUTURE) ×2
SUT VIC AB 2-0 CT1 TAPERPNT 27 (SUTURE) ×1 IMPLANT
SUT VIC AB 2-0 CTX 36 (SUTURE) IMPLANT
SUT VIC AB 3-0 SH 8-18 (SUTURE) ×6 IMPLANT
SYR 10ML LL (SYRINGE) ×9 IMPLANT
SYR 30ML LL (SYRINGE) ×6 IMPLANT
SYR 50ML LL SCALE MARK (SYRINGE) ×3 IMPLANT
TOWEL OR 17X26 10 PK STRL BLUE (TOWEL DISPOSABLE) ×6 IMPLANT
TRANSDUCER W/STOPCOCK (MISCELLANEOUS) ×6 IMPLANT
TRAY FOLEY SILVER 14FR TEMP (SET/KITS/TRAYS/PACK) ×3 IMPLANT
TUBE SUCT INTRACARD DLP 20F (MISCELLANEOUS) IMPLANT
TUBING HIGH PRESSURE 120CM (CONNECTOR) ×3 IMPLANT
VALVE HEART TRANSCATH SZ3 23MM (Prosthesis & Implant Heart) ×3 IMPLANT
WIRE AMPLATZ SS-J .035X180CM (WIRE) ×3 IMPLANT
WIRE BENTSON .035X145CM (WIRE) ×3 IMPLANT

## 2017-03-29 NOTE — Anesthesia Procedure Notes (Signed)
Procedure Name: Intubation Date/Time: 03/29/2017 3:15 PM Performed by: Kerby Less, CRNA Pre-anesthesia Checklist: Patient identified, Emergency Drugs available, Suction available and Patient being monitored Patient Re-evaluated:Patient Re-evaluated prior to induction Oxygen Delivery Method: Circle System Utilized Preoxygenation: Pre-oxygenation with 100% oxygen Induction Type: IV induction, Cricoid Pressure applied and Rapid sequence Laryngoscope Size: Mac and 3 Grade View: Grade I Tube type: Oral Tube size: 7.5 mm Number of attempts: 1 Airway Equipment and Method: Stylet Placement Confirmation: ETT inserted through vocal cords under direct vision,  positive ETCO2 and breath sounds checked- equal and bilateral Secured at: 21 cm Tube secured with: Tape Dental Injury: Teeth and Oropharynx as per pre-operative assessment

## 2017-03-29 NOTE — Progress Notes (Signed)
Pre Procedure note for inpatients:   Sonya Carpenter has been scheduled for Procedure(s): TRANSCATHETER AORTIC VALVE REPLACEMENT, TRANSFEMORAL (N/A) TRANSESOPHAGEAL ECHOCARDIOGRAM (TEE) (N/A) Possible TRANSCATHETER AORTIC VALVE REPLACEMENT,Left CAROTID (N/A) today. The various methods of treatment have been discussed with the patient. After consideration of the risks, benefits and treatment options the patient has consented to the planned procedure.   The patient has been seen and labs reviewed. There are no changes in the patient's condition to prevent proceeding with the planned procedure today.  Recent labs:  Lab Results  Component Value Date   WBC 4.7 03/29/2017   HGB 8.7 (L) 03/29/2017   HCT 27.2 (L) 03/29/2017   PLT 141 (L) 03/29/2017   GLUCOSE 104 (H) 03/29/2017   ALT 22 03/27/2017   AST 22 03/27/2017   NA 135 03/29/2017   K 5.3 (H) 03/29/2017   CL 99 (L) 03/29/2017   CREATININE 2.41 (H) 03/29/2017   BUN 80 (H) 03/29/2017   CO2 25 03/29/2017   INR 1.17 03/28/2017   HGBA1C 7.4 (H) 03/18/2017    Rexene Alberts, MD 03/29/2017 2:22 PM

## 2017-03-29 NOTE — Progress Notes (Signed)
TCTS BRIEF SICU PROGRESS NOTE  Day of Surgery  S/P Procedure(s) (LRB): TRANSCATHETER AORTIC VALVE REPLACEMENT, TRANSFEMORAL (N/A) TRANSESOPHAGEAL ECHOCARDIOGRAM (TEE) (N/A)    Sleepy but wakes up and follows commands.  Denies pain NSR w/ stable BP on very low dose levophed O2 sats 100%  Both groins okay Labs okay except Hgb 7.8  Plan: Will transfuse 1 unit PRBCs and o/w continue routine early post TAVR  Rexene Alberts, MD 03/29/2017 6:10 PM

## 2017-03-29 NOTE — OR Nursing (Signed)
Twenty minute call to SICU charge nurse at [phone]. Spoke to [employer].

## 2017-03-29 NOTE — Progress Notes (Signed)
  Echocardiogram Echocardiogram Transesophageal has been performed.  Sonya Carpenter 03/29/2017, 5:12 PM

## 2017-03-29 NOTE — Anesthesia Procedure Notes (Signed)
Arterial Line Insertion Start/End12/07/2016 1:00 PM, 03/29/2017 1:20 PM Performed by: Lavell Luster, CRNA, CRNA  Patient location: Pre-op. Preanesthetic checklist: patient identified, IV checked, site marked, risks and benefits discussed, surgical consent, monitors and equipment checked, pre-op evaluation and timeout performed Lidocaine 1% used for infiltration radial was placed Catheter size: 20 G Hand hygiene performed , maximum sterile barriers used  and Seldinger technique used Allen's test indicative of satisfactory collateral circulation Attempts: 1 Procedure performed without using ultrasound guided technique. Ultrasound Notes:anatomy identified Following insertion, dressing applied and Biopatch. Post procedure assessment: normal  Patient tolerated the procedure well with no immediate complications.

## 2017-03-29 NOTE — Progress Notes (Signed)
CSW sent updated PT notes to [facility] and they are initiated auth this morning for rehab stay  Jorge Ny, [place] Social Worker [phone]

## 2017-03-29 NOTE — Anesthesia Procedure Notes (Signed)
Central Venous Catheter Insertion Performed by: Belinda Block, MD Start/End12/07/2016 2:00 PM, 03/29/2017 2:20 PM Patient location: OR. Preanesthetic checklist: patient identified, IV checked, site marked, risks and benefits discussed, surgical consent, monitors and equipment checked, pre-op evaluation and timeout performed Position: Trendelenburg Lidocaine 1% used for infiltration and patient sedated Hand hygiene performed , maximum sterile barriers used  and Seldinger technique used Central line was placed.Double lumen Procedure performed using ultrasound guided technique. Attempts: 1 Following insertion, line sutured, dressing applied and Biopatch. Post procedure assessment: blood return through all ports  Patient tolerated the procedure well with no immediate complications.

## 2017-03-29 NOTE — Op Note (Signed)
CARDIOTHORACIC SURGERY OPERATIVE NOTE  Date of Procedure:  03/29/2017  Preoperative Diagnosis: Severe Aortic Stenosis   Postoperative Diagnosis: Same   Procedure:    Transcatheter Aortic Valve Replacement - Open Right Transfemoral Approach  Edwards Sapien 3 Transcatheter Heart Valve (size 23 mm, model # 9600TFX, serial # [id number])   Co-Surgeons:  Valentina Gu. 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  Patient states that she has known of the presence of a heart murmur for several years.  She was seen approximately 3 years ago by Dr. Rosalita Chessman and echocardiogram reportedly revealed findings consistent with moderate aortic stenosis.  Patient was lost to follow-up for a period of time primarily because her husband was in poor health.  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  During that hospitalization she was thought to have a possible abdominal mass with biliary ductal dilitation. 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  She did not have an abdominal mass but she was noted to have some gallstones with intermittent episodes of right upper quadrant abdominal discomfort brought on with large meals.  Elective cholecystectomy was planned but preoperative cardiac clearance requested due to the presence of her aortic stenosis.  She was subsequently readmitted to the hospital in [place] in September with shortness of breath associated with large right pleural effusion requiring thoracentesis.  She was treated for possible pneumonia.  Transthoracic echocardiogram revealed severe aortic stenosis with peak and mean transvalvular gradients reported 121 and 69 mmHg, respectively.  Left ventricular systolic function remains normal with ejection fraction estimated 65%.  She was referred to Dr. Burt Knack for consultation and underwent left and right heart catheterization by Dr. Angelena Form on February 09, 2017.  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  Peak to peak and mean transvalvular gradients across the aortic valve were measured 35 and 18.2 mmHg corresponding to aortic valve area calculated 1.26 cm squared.  Pulmonary function tests and CT angiography was performed and the patient was referred for elective surgical consultation.  The patient was subsequently sent for dental consultation and underwent extraction of all of her remaining teeth.  This required hospitalization because of significant bleeding in the patient's underlying frail hemodynamic status.  She was discharged from the hospital and return for elective transcatheter aortic valve replacement 1 week previously, only to be found to be severely anemic with acute exacerbation of chronic kidney disease.  Surgical intervention was again postponed.  Acute blood loss anemia was attributed to the patient's recent dental  procedure, although the possibility of occult GI blood loss was entertained.  The patient  stabilized and renal function returned to baseline.  During the course of the patient's preoperative work up they have been evaluated comprehensively by a multidisciplinary team of specialists coordinated through the [facility] in the [facility].  They have been demonstrated to suffer from symptomatic severe aortic stenosis as noted above. The patient has been counseled extensively as to the relative risks and benefits of all options for the treatment of severe aortic stenosis including long term medical therapy, conventional surgery for aortic valve replacement, and transcatheter aortic valve replacement.  All questions have been answered, and the patient provides full informed consent for the operation as described.   DETAILS OF THE OPERATIVE PROCEDURE  PREPARATION:    The patient is brought to the operating room on the above mentioned date and central monitoring was established by the anesthesia team including placement of a central venous line and radial arterial line. The patient is placed in the supine position on the operating table.  Intravenous antibiotics are administered.  General endotracheal anesthesia is induced uneventfully.  A Foley catheter is placed.  Baseline transesophageal echocardiogram was performed. The patient's chest, abdomen, both groins, and both lower extremities are prepared and draped in a sterile manner. A time out procedure is performed.   PERIPHERAL ACCESS:    Using the modified Seldinger technique, femoral arterial and venous access was obtained with placement of 6 Fr sheaths on the left side.  A pigtail diagnostic catheter was passed through the left arterial sheath under fluoroscopic guidance into the aortic root.  A temporary transvenous pacemaker catheter was passed through the left femoral venous sheath under  fluoroscopic guidance into the right ventricle.  The pacemaker was tested to ensure stable lead placement and pacemaker capture. Aortic root angiography was performed in order to determine the optimal angiographic angle for valve deployment.   TRANSFEMORAL ACCESS:   A small incision is made in the right groin immediately over the common femoral artery. The subcutaneous tissues are divided with electrocautery and the anterior surface of the common femoral artery is identified. Sharp dissection is utilized to free up the artery proximally and distally and an appropriate site for arterial puncture is identified.  The femoral artery was notably severely diseased with atherosclerotic plaque.  There is hard calcified plaque just above the bifurcation extending proximally well into the external iliac artery.  There is a single soft spot noted immediately at the bifurcation.  A pair of CV-4 Gore-tex sutures are place as diamond-shaped purse-strings on the anterior surface of the femoral artery.  The patient is heparinized systemically and ACT verified > 250 seconds.  The common femoral artery is punctured using an 18 gauge needle and a soft J-tipped guidewire is passed into the common iliac artery under fluoroscopic guidance.  A 6 Fr straight diagnostic catheter is placed over the guidewire and the guidewire is removed.  An Amplatz super stiff guidewire is passed through the sheath into the descending thoracic aorta and the introducing diagnostic catheter is removed.  Serial dilators are passed over the guidewire under continuous fluoroscopic guidance, making certain that each dilator passes easily all of the way into the distal abdominal aorta.  A 14 Commander introducer sheath is passed over the guidewire into the abdominal aorta.  The introducing dilator is removed, the sheath is flushed with heparinized saline, and the sheath is secured to the skin.  An AL-2 catheter was used to direct a straight-tip exchange  length wire across the native  aortic valve into the left ventricle. This was exchanged out for a pigtail catheter and position was confirmed in the LV apex. Simultaneous LV and Ao pressures were recorded.  The pigtail catheter was exchanged for an Amplatz Extra-stiff wire in the LV apex.  Echocardiography was utilized to confirm appropriate wire position and no sign of entanglement in the mitral subvalvular apparatus.   TRANSCATHETER HEART VALVE DEPLOYMENT:   An Edwards Sapien 3 transcatheter heart valve (size 23 mm, model #9600TFX, serial #[id number]) was prepared and crimped per manufacturer's guidelines, and the proper orientation of the valve is confirmed on the [employer] delivery system. The valve was advanced through the introducer sheath using normal technique until in an appropriate position in the abdominal aorta beyond the sheath tip. The balloon was then retracted and using the fine-tuning wheel was centered on the valve. The valve was then advanced across the aortic arch using appropriate flexion of the catheter. The valve was carefully positioned across the aortic valve annulus. The Commander catheter was retracted using normal technique. Once final position of the valve has been confirmed by angiographic assessment, the valve is deployed while temporarily holding ventilation and during rapid ventricular pacing to maintain systolic blood pressure < 50 mmHg and pulse pressure < 10 mmHg. The balloon inflation is held for >3 seconds after reaching full deployment volume. Once the balloon has fully deflated the balloon is retracted into the ascending aorta and valve function is assessed using echocardiography. There is felt to be trivial paravalvular leak and no central aortic insufficiency.  The patient's hemodynamic recovery following valve deployment is good.  The deployment balloon and guidewire are both removed.    FEMORAL SHEATH REMOVAL AND ARTERIAL CLOSURE:  The femoral artery sheath  is removed and the arteriotomy is closed using the previously placed Gore-tex purse-string sutures. Once the repair has been completed protamine was administered to reverse the anticoagulation. A digitally-subtracted arteriogram is obtained from the common iliac artery to below the arteriotomy to confirm the integrity of the vascular repair.  The incision is irrigated with saline solution and subsequently closed in multiple layers using absorbable suture.  The skin incision is closed using a subcuticular skin closure.   PROCEDURE COMPLETION:   The temporary pacemaker, pigtail catheters and femoral sheaths were removed with manual pressure used for hemostasis.   The patient tolerated the procedure well and is transported to the surgical intensive care in stable condition. There were no immediate intraoperative complications. All sponge instrument and needle counts are verified correct at completion of the operation.   No blood products were administered during the operation.  The patient received a total of 36 mL of intravenous contrast during the procedure.    Valentina Gu. Roxy Manns MD 03/29/2017 5:19 PM

## 2017-03-29 NOTE — Interval H&P Note (Signed)
History and Physical Interval Note:  03/29/2017 2:46 PM  Sonya Carpenter  has presented today for surgery, with the diagnosis of severe aortic stenosis  The various methods of treatment have been discussed with the patient and family. After consideration of risks, benefits and other options for treatment, the patient has consented to  Procedure(s): TRANSCATHETER AORTIC VALVE REPLACEMENT, TRANSFEMORAL (N/A) TRANSESOPHAGEAL ECHOCARDIOGRAM (TEE) (N/A) Possible TRANSCATHETER AORTIC VALVE REPLACEMENT,Left CAROTID (N/A) as a surgical intervention .  The patient's history has been reviewed, patient examined, no change in status, stable for surgery.  I have reviewed the patient's chart and labs.  Questions were answered to the patient's satisfaction.     [name]

## 2017-03-29 NOTE — Anesthesia Procedure Notes (Signed)
Anesthesia Procedure Note     

## 2017-03-29 NOTE — Transfer of Care (Signed)
Immediate Anesthesia Transfer of Care Note  Patient: Sonya Carpenter  Procedure(s) Performed: TRANSCATHETER AORTIC VALVE REPLACEMENT, TRANSFEMORAL (N/A Chest) TRANSESOPHAGEAL ECHOCARDIOGRAM (TEE) (N/A Chest)  Patient Location: ICU  Anesthesia Type:General  Level of Consciousness: drowsy  Airway & Oxygen Therapy: Patient Spontanous Breathing and Patient connected to face mask oxygen  Post-op Assessment: Report given to RN and Post -op Vital signs reviewed and stable  Post vital signs: Reviewed and stable  Last Vitals:  Vitals:   03/29/17 1415 03/29/17 1420  BP: (!) 138/55 (!) 140/56  Pulse: 74 76  Resp: 16 13  Temp:    SpO2: 100% 100%    Last Pain:  Vitals:   03/29/17 1200  TempSrc: Oral  PainSc:          Complications: No apparent anesthesia complications

## 2017-03-29 NOTE — Anesthesia Postprocedure Evaluation (Signed)
Anesthesia Post Note  Patient: Sonya Carpenter  Procedure(s) Performed: TRANSCATHETER AORTIC VALVE REPLACEMENT, TRANSFEMORAL (N/A Chest) TRANSESOPHAGEAL ECHOCARDIOGRAM (TEE) (N/A Chest)     Patient location during evaluation: ICU Anesthesia Type: General Level of consciousness: awake Pain management: pain level controlled Vital Signs Assessment: post-procedure vital signs reviewed and stable Respiratory status: spontaneous breathing Cardiovascular status: stable Anesthetic complications: no    Last Vitals:  Vitals:   03/29/17 1415 03/29/17 1420  BP: (!) 138/55 (!) 140/56  Pulse: 74 76  Resp: 16 13  Temp:    SpO2: 100% 100%    Last Pain:  Vitals:   03/29/17 1200  TempSrc: Oral  PainSc:                  Jade Burright

## 2017-03-29 NOTE — Progress Notes (Signed)
PHARMACY NOTE:  ANTIMICROBIAL RENAL DOSAGE ADJUSTMENT  Current antimicrobial regimen includes a mismatch between antimicrobial dosage and estimated renal function.  As per policy approved by the Pharmacy & Therapeutics and Medical Executive Committees, the antimicrobial dosage will be adjusted accordingly.  Current antimicrobial dosage:  vanc 1gm IV x 1, 12 hrs post-op  Indication: surgical prophylais  Renal Function:  Estimated Creatinine Clearance: 17.8 mL/min (A) (by C-G formula based on SCr of 1.9 mg/dL (H)). []      On intermittent HD, scheduled: []      On CRRT    Antimicrobial dosage has been changed to:  D/C'ed. Pre-op dose would last 48 hrs.  Additional comments:   Thank you for allowing pharmacy to be a part of this patient's care.  Kirsi Hugh D. Mina Marble, PharmD, BCPS Pager:  [phone] 03/29/2017, 5:51 PM

## 2017-03-29 NOTE — OR Nursing (Signed)
Forty-five minute call to SICU charge nurse at [phone]. Spoke to [employer].

## 2017-03-29 NOTE — Progress Notes (Signed)
PHARMACY NOTE:  ANTIMICROBIAL RENAL DOSAGE ADJUSTMENT  Current antimicrobial regimen includes a mismatch between antimicrobial dosage and estimated renal function.  As per policy approved by the Pharmacy & Therapeutics and Medical Executive Committees, the antimicrobial dosage will be adjusted accordingly.  Current antimicrobial dosage: zinacef 1.5gm IV q12h x4 doses  Indication: surgical prophylaxis  Renal Function:  Estimated Creatinine Clearance: 17.8 mL/min (A) (by C-G formula based on SCr of 1.9 mg/dL (H)). []      On intermittent HD, scheduled: []      On CRRT    Antimicrobial dosage has been changed to: Zinacef 1.5gm IV q24hr x 2 doses   Thank you for allowing pharmacy to be a part of this patient's care.  Hildred Laser, Pharm D 03/29/2017 6:24 PM

## 2017-03-30 ENCOUNTER — Inpatient Hospital Stay (HOSPITAL_COMMUNITY): Payer: Medicare HMO

## 2017-03-30 ENCOUNTER — Encounter (HOSPITAL_COMMUNITY): Payer: Self-pay | Admitting: Cardiovascular Disease

## 2017-03-30 ENCOUNTER — Other Ambulatory Visit: Payer: Self-pay

## 2017-03-30 DIAGNOSIS — I361 Nonrheumatic tricuspid (valve) insufficiency: Secondary | ICD-10-CM

## 2017-03-30 DIAGNOSIS — I350 Nonrheumatic aortic (valve) stenosis: Secondary | ICD-10-CM

## 2017-03-30 DIAGNOSIS — Z952 Presence of prosthetic heart valve: Secondary | ICD-10-CM

## 2017-03-30 LAB — BASIC METABOLIC PANEL
Anion gap: 13 (ref 5–15)
BUN: 78 mg/dL — ABNORMAL HIGH (ref 6–20)
CHLORIDE: 105 mmol/L (ref 101–111)
CO2: 21 mmol/L — AB (ref 22–32)
CREATININE: 2.14 mg/dL — AB (ref 0.44–1.00)
Calcium: 9.2 mg/dL (ref 8.9–10.3)
GFR calc non Af Amer: 22 mL/min — ABNORMAL LOW (ref >60)
GFR, EST AFRICAN AMERICAN: 26 mL/min — AB (ref >60)
Glucose, Bld: 115 mg/dL — ABNORMAL HIGH (ref 65–99)
POTASSIUM: 5.2 mmol/L — AB (ref 3.5–5.1)
Sodium: 139 mmol/L (ref 135–145)

## 2017-03-30 LAB — CBC
HEMATOCRIT: 32.3 % — AB (ref 36.0–46.0)
HEMOGLOBIN: 10.5 g/dL — AB (ref 12.0–15.0)
MCH: 28.5 pg (ref 26.0–34.0)
MCHC: 32.5 g/dL (ref 30.0–36.0)
MCV: 87.5 fL (ref 78.0–100.0)
Platelets: 195 10*3/uL (ref 150–400)
RBC: 3.69 MIL/uL — AB (ref 3.87–5.11)
RDW: 15.2 % (ref 11.5–15.5)
WBC: 6.2 10*3/uL (ref 4.0–10.5)

## 2017-03-30 LAB — BPAM RBC
BLOOD PRODUCT EXPIRATION DATE: 201812262359
ISSUE DATE / TIME: 201812041825
UNIT TYPE AND RH: 6200

## 2017-03-30 LAB — GLUCOSE, CAPILLARY
GLUCOSE-CAPILLARY: 146 mg/dL — AB (ref 65–99)
GLUCOSE-CAPILLARY: 165 mg/dL — AB (ref 65–99)
GLUCOSE-CAPILLARY: 266 mg/dL — AB (ref 65–99)
GLUCOSE-CAPILLARY: 176 mg/dL — AB (ref 65–99)
Glucose-Capillary: 137 mg/dL — ABNORMAL HIGH (ref 65–99)
Glucose-Capillary: 99 mg/dL (ref 65–99)
Glucose-Capillary: 351 mg/dL — ABNORMAL HIGH (ref 65–99)
Glucose-Capillary: 363 mg/dL — ABNORMAL HIGH (ref 65–99)

## 2017-03-30 LAB — TYPE AND SCREEN
ABO/RH(D): A POS
Antibody Screen: NEGATIVE
UNIT DIVISION: 0

## 2017-03-30 LAB — OCCULT BLOOD X 1 CARD TO LAB, STOOL: FECAL OCCULT BLD: POSITIVE — AB

## 2017-03-30 LAB — ECHOCARDIOGRAM COMPLETE
Height: 58 in
WEIGHTICAEL: 1644.8 [oz_av]

## 2017-03-30 LAB — MAGNESIUM: MAGNESIUM: 2 mg/dL (ref 1.7–2.4)

## 2017-03-30 MED ORDER — INSULIN GLARGINE 100 UNIT/ML ~~LOC~~ SOLN
10.0000 [IU] | Freq: Every day | SUBCUTANEOUS | Status: DC
  Administered 2017-03-30 – 2017-04-01 (×3): 10 [IU] via SUBCUTANEOUS
  Filled 2017-03-30 (×3): qty 0.1

## 2017-03-30 MED FILL — Phenylephrine HCl Inj 10 MG/ML: INTRAMUSCULAR | Qty: 1 | Status: AC

## 2017-03-30 MED FILL — Heparin Sodium (Porcine) Inj 1000 Unit/ML: INTRAMUSCULAR | Qty: 30 | Status: AC

## 2017-03-30 MED FILL — Potassium Chloride Inj 2 mEq/ML: INTRAVENOUS | Qty: 20 | Status: AC

## 2017-03-30 MED FILL — Magnesium Sulfate Inj 50%: INTRAMUSCULAR | Qty: 10 | Status: AC

## 2017-03-30 MED FILL — Sodium Chloride IV Soln 0.9%: INTRAVENOUS | Qty: 250 | Status: AC

## 2017-03-30 NOTE — Progress Notes (Signed)
03/30/2017 7:42 PM Received pt to room 4E-14 from [place].  Pt is A&O, no C/O voiced.  Tele monitor applied and CCMD notified.  Oriented to room, call light and bed.  Call bell in reach, family at bedside. [place]

## 2017-03-30 NOTE — Progress Notes (Signed)
  Echocardiogram 2D Echocardiogram has been performed.  Sonya Carpenter F 03/30/2017, 1:16 PM

## 2017-03-30 NOTE — Op Note (Signed)
[facility]   MULTIDISCIPLINARY HEART VALVE TEAM   TAVR OPERATIVE NOTE   Date of Procedure:  03/30/2017  Preoperative Diagnosis: Severe Aortic Stenosis   Postoperative Diagnosis: Same   Procedure:    Transcatheter Aortic Valve Replacement - Open Transfemoral Approach  Edwards Sapien 3 THV (size 23 mm, model # 9600TFX, serial # [id number])   Co-Surgeons:  Valentina Gu. Roxy Manns, MD and [name], MD  Anesthesiologist:  Belinda Block, MD  Echocardiographer:  Loralie Champagne, MD  Pre-operative Echo Findings:   Severe aortic stenosis  Normal left ventricular systolic function  Post-operative Echo Findings:  trace paravalvular leak  normal left ventricular systolic function  BRIEF CLINICAL NOTE AND INDICATIONS FOR SURGERY  Please see complete note of Dr Roxy Manns for details.   Following the decision to proceed with transcatheter aortic valve replacement, a discussion has been held regarding what types of management strategies would be attempted intraoperatively in the event of life-threatening complications, including whether or not the patient would be considered a candidate for the use of cardiopulmonary bypass and/or conversion to open sternotomy for attempted surgical intervention.  The patient has been advised of a variety of complications that might develop peculiar to this approach including but not limited to risks of death, stroke, paravalvular leak, aortic dissection or other major vascular complications, aortic annulus rupture, device embolization, cardiac rupture or perforation, acute myocardial infarction, arrhythmia, heart block or bradycardia requiring permanent pacemaker placement, congestive heart failure, respiratory failure, renal failure, pneumonia, infection, other late complications related to structural valve deterioration or migration, or other complications that might ultimately cause a temporary or permanent loss of functional independence or other  long term morbidity.  The patient provides full informed consent for the procedure as described and all questions were answered preoperatively.  DETAILS OF THE OPERATIVE PROCEDURE  PREPARATION:   The patient is brought to the operating room on the above mentioned date and central monitoring was established by the anesthesia team including placement of a central venous catheter and radial arterial line. The patient is placed in the supine position on the operating table.  Intravenous antibiotics are administered.   General endotracheal anesthesia is induced uneventfully.  A Foley catheter is placed.  Baseline transesophageal echocardiogram is performed. The patient's chest, abdomen, both groins, and both lower extremities are prepared and draped in a sterile manner. A time out procedure is performed.   PERIPHERAL ACCESS:   Using ultrasound guidance, femoral arterial and venous access is obtained with placement of 6 Fr sheaths on the left side.  A pigtail diagnostic catheter was passed through the femoral arterial sheath under fluoroscopic guidance into the aortic root.  A temporary transvenous pacemaker catheter was passed through the femoral venous sheath under fluoroscopic guidance into the right ventricle.  The pacemaker was tested to ensure stable lead placement and pacemaker capture. Aortic root angiography was performed in order to determine the optimal angiographic angle for valve deployment.  TRANSFEMORAL ACCESS:  An open cutdown is performed by Dr Roxy Manns. Please see his note for details.   TRANSCATHETER HEART VALVE DEPLOYMENT:  An Edwards Sapien 3 transcatheter heart valve (size 23 mm, model #9600TFX, serial #[id number]) was prepared and crimped per manufacturer's guidelines, and the proper orientation of the valve is confirmed on the [employer] delivery system. The valve was advanced through the introducer sheath using normal technique until in an appropriate position in the abdominal  aorta beyond the sheath tip. The balloon was then retracted and using the fine-tuning wheel  was centered on the valve. The valve was then advanced across the aortic arch using appropriate flexion of the catheter. The valve was carefully positioned across the aortic valve annulus. The Commander catheter was retracted using normal technique. Once final position of the valve has been confirmed by angiographic assessment, the valve is deployed while temporarily holding ventilation and during rapid ventricular pacing to maintain systolic blood pressure < 50 mmHg and pulse pressure < 10 mmHg. The balloon inflation is held for >3 seconds after reaching full deployment volume. Once the balloon has fully deflated the balloon is retracted into the ascending aorta and valve function is assessed using echocardiography. There is felt to be trace paravalvular leak and no central aortic insufficiency.  The patient's hemodynamic recovery following valve deployment is good.  The deployment balloon and guidewire are both removed. Echo demostrated acceptable post-procedural gradients, stable mitral valve function, and trace aortic insufficiency.    PROCEDURE COMPLETION:  Please see Dr Guy Sandifer complete note for details of the arteriotomy repair and closure.  The temporary pacemaker, pigtail catheters and femoral sheaths were removed with manual pressure used for hemostasis.   The patient tolerated the procedure well and is transported to the surgical intensive care in stable condition. There were no immediate intraoperative complications. All sponge instrument and needle counts are verified correct at completion of the operation.   The patient received a total of 36 mL of intravenous contrast during the procedure.   [name], MD 03/30/2017 6:08 AM

## 2017-03-30 NOTE — Progress Notes (Addendum)
CSW informed by [facility] has denied SNF stay for patient- we can either start authorization over or MD can call to do peer to peer appeal  CSW paged MD to inform  Phone # for appeal: [phone] Reference #: [id number]  Will need to be completed by 12pm on 12/6  Jorge Ny, [place] Social Worker [phone]

## 2017-03-30 NOTE — Progress Notes (Signed)
CARDIAC REHAB PHASE I   PRE:  Rate/Rhythm: 89 SR    BP: sitting 134/56    SaO2: 99 RA  MODE:  Ambulation: 370 ft   POST:  Rate/Rhythm: 117 ST    BP: sitting 137/56     SaO2: 98 RA  Pt ambulated with RW, assist x1, gait belt. Slow, steady pace. Very talkative, no major c/o. Tired after walk then asked to go to BR. Gave reminders for safety. Will f/u tomorrow to ambulate. [place], ACSM 03/30/2017 2:48 PM

## 2017-03-30 NOTE — Discharge Instructions (Signed)

## 2017-03-30 NOTE — Progress Notes (Signed)
[facility] TEAM  Patient Name: Sonya Carpenter Date of Encounter: 03/30/2017  Primary Cardiologist: Dr.Zachary ([place], [place]) / Dr. Burt Knack & Dr. Roxy Manns (TAVR)   Hospital Problem List     Principal Problem:   S/P TAVR (transcatheter aortic valve replacement) Active Problems:   Severe aortic stenosis   CKD (chronic kidney disease) stage 4, GFR 15-29 ml/min ([place])   Hypertension   Type 2 diabetes mellitus without complication, with long-term current use of insulin (HCC)   Anemia, iron deficiency   Acute blood loss anemia   Mitral stenosis   Acute on chronic diastolic heart failure (HCC)   Pressure injury of skin     Subjective   No complaints. Feeling well. Walked this AM and felt much better.   Inpatient Medications    Scheduled Meds: . acetaminophen  1,000 mg Oral Q6H  . aspirin EC  81 mg Oral Daily  . atorvastatin  20 mg Oral Daily  . clopidogrel  75 mg Oral Q breakfast  . feeding supplement (ENSURE ENLIVE)  237 mL Oral BID BM  . ferrous sulfate  325 mg Oral BID  . insulin aspart  0-15 Units Subcutaneous TID WC  . insulin aspart  0-24 Units Subcutaneous Q4H  . loratadine  10 mg Oral Daily  . mometasone-formoterol  2 puff Inhalation BID  . montelukast  10 mg Oral QHS  . [START ON 03/31/2017] pantoprazole  40 mg Oral Daily  . protein supplement shake  11 oz Oral TID BM   Continuous Infusions: . sodium chloride Stopped (03/30/17 0243)  . sodium chloride    . albumin human    . cefUROXime (ZINACEF)  IV Stopped (03/29/17 1935)  . famotidine (PEPCID) IV Stopped (03/29/17 2245)  . lactated ringers    . nitroGLYCERIN    . norepinephrine (LEVOPHED) Adult infusion    . phenylephrine (NEO-SYNEPHRINE) Adult infusion 2 mcg/min (03/30/17 0620)   PRN Meds: albumin human, docusate sodium, fentaNYL (SUBLIMAZE) injection, lactated ringers, lubiprostone, metoprolol tartrate, ondansetron (ZOFRAN) IV, traMADol   Vital Signs      Vitals:   03/30/17 0630 03/30/17 0645 03/30/17 0700 03/30/17 0715  BP: 128/60 (!) 110/51 (!) 111/55 (!) 122/58  Pulse: 86 84 83 85  Resp: 14 15 12  (!) 23  Temp:      TempSrc:      SpO2: 97% 96% 97% 98%  Weight:      Height:        Intake/Output Summary (Last 24 hours) at 03/30/2017 0829 Last data filed at 03/30/2017 0600 Gross per 24 hour  Intake 2356.87 ml  Output 2235 ml  Net 121.87 ml   Filed Weights   03/29/17 0119 03/29/17 1800 03/30/17 0500  Weight: 101 lb (45.8 kg) 100 lb 15.5 oz (45.8 kg) 102 lb 12.8 oz (46.6 kg)    Physical Exam   General: chronically ill appearing. Elderly and frail appearing WF HEENT: normal Lymph: no adenopathy Neck: no JVD Endocrine:  No thryomegaly Vascular: No carotid bruits; FA pulses 2+ bilaterally without bruits  Cardiac:  normal S1, S2; RRR; no murmur  Lungs:  clear to auscultation bilaterally. Few, faint wheezing. No rhonchi or rales  Abd: soft, nontender, no hepatomegaly  Ext: no edema Musculoskeletal:  No deformities, BUE and BLE strength normal and equal Skin: warm and dry. Groin sites are stable.  Neuro:  CNs 2-12 intact, no focal abnormalities noted Psych:  Normal affect    Labs    CBC Recent  Labs    03/29/17 1746  03/29/17 2228 03/30/17 0430  WBC 7.0  --   --  6.2  HGB 9.1*   < > 10.7* 10.5*  HCT 28.5*   < > 33.1* 32.3*  MCV 89.6  --   --  87.5  PLT 145*  --   --  195   < > = values in this interval not displayed.   Basic Metabolic Panel Recent Labs    03/29/17 0319  03/29/17 1611 03/29/17 1753 03/30/17 0430  NA 135   < > 139 141 139  K 5.3*   < > 4.4 4.8 5.2*  CL 99*   < > 107  --  105  CO2 25  --   --   --  21*  GLUCOSE 104*   < > 150* 159* 115*  BUN 80*   < > 65*  --  78*  CREATININE 2.41*   < > 1.90*  --  2.14*  CALCIUM 9.6  --   --   --  9.2  MG  --   --   --   --  2.0   < > = values in this interval not displayed.   Liver Function Tests Recent Labs    03/27/17 1331  AST 22  ALT 22   ALKPHOS 77  BILITOT 0.6  PROT 6.2*  ALBUMIN 2.9*   Recent Labs    03/27/17 1331  AMYLASE 141*   Cardiac Enzymes No results for input(s): CKTOTAL, CKMB, CKMBINDEX, TROPONINI in the last 72 hours. BNP Invalid input(s): POCBNP D-Dimer No results for input(s): DDIMER in the last 72 hours. Hemoglobin A1C No results for input(s): HGBA1C in the last 72 hours. Fasting Lipid Panel No results for input(s): CHOL, HDL, LDLCALC, TRIG, CHOLHDL, LDLDIRECT in the last 72 hours. Thyroid Function Tests No results for input(s): TSH, T4TOTAL, T3FREE, THYROIDAB in the last 72 hours.  Invalid input(s): FREET3  Telemetry    sinus - Personally Reviewed  ECG   NSR HR 77  Radiology    Dg Chest Port 1 View  Result Date: 03/29/2017 CLINICAL DATA:  Post TAVR, history asthma, coronary disease, CHF, COPD, diabetes mellitus EXAM: PORTABLE CHEST 1 VIEW COMPARISON:  Portable exam 1744 hours compared 03/25/2017 FINDINGS: RIGHT jugular catheter with tip projecting over RIGHT atrium. Rotated to the LEFT. Upper normal size of cardiac silhouette post TAVR. Mediastinal contours and pulmonary vascularity normal. Atherosclerotic calcification aorta. Bibasilar atelectasis greater on LEFT. Lungs otherwise clear. No definite infiltrate, pleural effusion or pneumothorax. Bones demineralized. IMPRESSION: Bibasilar atelectasis greater on LEFT. Electronically Signed   By: Lavonia Dana M.D.   On: 03/29/2017 18:13    Cardiac Studies   TAVR OPERATIVE NOTE   Date of Procedure:                03/30/2017  Preoperative Diagnosis:      Severe Aortic Stenosis   Postoperative Diagnosis:    Same   Procedure:        Transcatheter Aortic Valve Replacement - Open Transfemoral Approach             Edwards Sapien 3 THV (size 23 mm, model # 9600TFX, serial # [id number])              Co-Surgeons:                        Valentina Gu. Roxy Manns, MD and [name], MD  Anesthesiologist:  Belinda Block,  MD  Echocardiographer:              Loralie Champagne, MD  Pre-operative Echo Findings: ?  Severe aortic stenosis ? Normal left ventricular systolic function  Post-operative Echo Findings: ? trace paravalvular leak ? normal left ventricular systolic function  _________________  Post operative echo 03/30/17 pending.   Patient Profile     Sonya Carpenter is a 71 y.o. 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 The patient appeared weak and pale prior to surgery so STAT labs were ordered which revealed acute blood loss anemia and AKI. She was admitted for further observation and work up. She has remained inpatient until TAVR on 03/29/17.   Assessment & Plan   Severe AS: TAVR cancelled last Tuesday given AKI and acute anemia. She was kept inpatient for close monitoring. Hg and creat stabilized and she underwent successful TAVR with a 49mm Edwards Sapien 3 THV via femoral cut down on 03/29/17. 2D ECHO pending today. ECG with NSR. Groin sites are stable today. Continue ASA and plavix. Will remove central line and arterial line and transfer to the floor.   Acute blood loss anemia: HgB 5.4 on admission (down from 9.9) mg/dL last week. Felt to be 2/2 dental extractions with significant blood loss. She received 2 U PRBCS on 11/27. Hg remained relatively stable but then started to drift down. She had post op anemia requiring a transfusion of 1 U and now Hg at 10.5. Will continue to monitor and also recheck a FOBT given ongoing issues with anemia. She may require a EGD/colonsocopy in the future. She is followed by GI at [facility] and supposed to have cholecystectomy at some point after TAVR.   Acute on chronic diastolic CHF: 2/2 volume resuscitation with IVF and  blood products. CXR showed persistent pulmonary vascular congestion. She treated with several doses of IV lasix. She appears euvolemic today. Hopefully this will improve s/p TAVR.   AKI: creatinine 3.02 on admission (up from 2.39) mg/dL last week. Likely from hypovolemia in the setting of acute blood loss. Creat stable at 2.14 today.  DMT2: continue SSI.    Dispo: she has very little social support at home and very weak and frail. She will need to go to SNF at discharge. Social work is working on authorization at [employer] for [employer].   Signed, Angelena Form, PA-C  03/30/2017, 8:29 AM  Pager [phone]   I have seen and examined the patient and agree with the assessment and plan as outlined.  Doing well s/p TAVR.  Maintaining NSR w/ stable BP and ECG unchanged from preop. Hgb up following transfusion and renal function stable.  Routine ECHO today.  Transfer tele.  Will need SNF placement.  Rexene Alberts, MD 03/30/2017 8:53 AM

## 2017-03-31 ENCOUNTER — Encounter (HOSPITAL_COMMUNITY): Payer: Self-pay | Admitting: Physician Assistant

## 2017-03-31 DIAGNOSIS — R195 Other fecal abnormalities: Secondary | ICD-10-CM

## 2017-03-31 DIAGNOSIS — D649 Anemia, unspecified: Secondary | ICD-10-CM

## 2017-03-31 LAB — GLUCOSE, CAPILLARY
GLUCOSE-CAPILLARY: 161 mg/dL — AB (ref 65–99)
GLUCOSE-CAPILLARY: 202 mg/dL — AB (ref 65–99)
GLUCOSE-CAPILLARY: 151 mg/dL — AB (ref 65–99)
Glucose-Capillary: 109 mg/dL — ABNORMAL HIGH (ref 65–99)

## 2017-03-31 LAB — CBC
HEMATOCRIT: 29.8 % — AB (ref 36.0–46.0)
HEMOGLOBIN: 9.7 g/dL — AB (ref 12.0–15.0)
MCH: 28.8 pg (ref 26.0–34.0)
MCHC: 32.6 g/dL (ref 30.0–36.0)
MCV: 88.4 fL (ref 78.0–100.0)
Platelets: 118 10*3/uL — ABNORMAL LOW (ref 150–400)
RBC: 3.37 MIL/uL — AB (ref 3.87–5.11)
RDW: 15.4 % (ref 11.5–15.5)
WBC: 5.9 10*3/uL (ref 4.0–10.5)

## 2017-03-31 LAB — BASIC METABOLIC PANEL
ANION GAP: 8 (ref 5–15)
BUN: 81 mg/dL — AB (ref 6–20)
CO2: 22 mmol/L (ref 22–32)
Calcium: 9 mg/dL (ref 8.9–10.3)
Chloride: 109 mmol/L (ref 101–111)
Creatinine, Ser: 2.18 mg/dL — ABNORMAL HIGH (ref 0.44–1.00)
GFR, EST AFRICAN AMERICAN: 25 mL/min — AB (ref >60)
GFR, EST NON AFRICAN AMERICAN: 22 mL/min — AB (ref >60)
Glucose, Bld: 65 mg/dL (ref 65–99)
POTASSIUM: 3.9 mmol/L (ref 3.5–5.1)
SODIUM: 139 mmol/L (ref 135–145)

## 2017-03-31 NOTE — Consult Note (Signed)
[facility] Consult: 11:16 AM 03/31/2017  LOS: 9 days    Referring Provider: Dr Roxy Manns  Primary Care Physician:  Michell Heinrich, DO Primary Gastroenterologist: unassigned.  Dr. Posey Pronto in [place]    Reason for Consultation:  FOBT + anemia   HPI: Sonya Carpenter is a 71 y.o. female.  CKD stage 4.  IDDM2.  Diastolic CHF.  Thrombocytopenia (117 on 02/04/17, ).  Anemia of CKD.  Has received epo per renal docs at outpt center in [place] and taking iron for at least 1 year.  S/p 01/2017 Colonoscopy in [place], Dr Posey Pronto: polyps of unclear type removed. LE cellulitis.   09/2016 admitted to hospital in [place] with right upper quadrant pain, lower extremity cellulitis.HIDA scan positive for chronic cholecystitis.  09/30/16 MRCP revealed diffuse dilation of the common bile duct to 17 mm with abrupt narrowing of the CBD at the level of the ampulla and a questional 7 mm filling defect in this region which could represent a mass or stone. Tiny gallstones present in the gall bladder fundus. S/p  10/01/16 ERCP with pancreatic duct stent placement, MD unable to cannulate CBD.   Transferred to [facility] for tertiary biliary expertise.  S/p 10/04/16 ERCP/EUS revealing significant dilation of main bile duct and biliary sludge; underwent sphincterotomy and multiple balloon sweeps with removal of sludge.  Pancreatic duct was monitored with x-ray and ultimately she passed the pancreatic stent in her stool on 6/14.  Discharged to SNF.   Elective cholecystectomy planned, but in pre-surgical clearance, previously moderate Ao Stenosis determined to be critical and referred to [facility] for TAVR.  Admissions to [facility] before and after the ERCP for pneumonia, pleural effusion requiring thoracentesis. It was during the earlier [facility] admission  that work up of "possible" abdominal mass, biliary ductal dilatation, that she was referred to [facility].     Patient was set up for TAVR last week.  She had undergone prophylactic dental extractions of 22 teeth on 03/14/17.  That day labs revealed Hgb of 8.5 and AKI.  Transfused 1 U PRBC to 9.5. FOBT negative on 11/20.  Discharged to home.  Had a lot of oral bleeding for up to 1 week after extractions.  Stools chronically formed, black as she takes iron BID.  Returned for TAVR 11/27, but it was postponed due to anemia, ultimately  performed 12/4.   Hgb 5.4 on 11/27.  Transfused 2 U PRBC.  Platelets 96 K.   03/29/17 Hgb went from 9.1 to 7.8 and transfused with 1 U PRBC (her 3rd unit in total during this admission.).  She has now tested FOBT + x 2 beginning 12/5.    Taking 81 ASA since 12/3 and chronically at home.  On Plavix since 12/5.  Neither on hold.   No PPI in place and no PPI at home.  No NSAIDs  Has some nausea, anorexia (several weeks).  Some mid abdominal pain and pain in groins.       Past Medical History:  Diagnosis Date  . Anemia   . Aortic stenosis   .  Arthritis    back  . Asthma   . CAD (coronary artery disease)   . Chronic diastolic congestive heart failure ([place])   . CKD (chronic kidney disease), stage IV ([place])   . COPD (chronic obstructive pulmonary disease) ([place])   . Diabetes mellitus without complication ([place])    type 2  . Gallbladder disease   . Headache   . Hyperlipidemia   . Hypertension   . Mitral stenosis   . Pneumonia   . 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 Vitamin D deficiency     Past Surgical History:  Procedure Laterality Date  . BREAST CYST EXCISION Right   . COLONSCOPY    . ELBOW SURGERY Left   . KNEE SURGERY    . MULTIPLE EXTRACTIONS WITH ALVEOLOPLASTY N/A 03/14/2017   Procedure: Extraction of tooth #'s 2-11, 15, 19-28 and 31  with alveoloplasty, bilateral mandibular tori reductions and bilateral mandibular lingual exostoses reductions.;  Surgeon: Lenn Cal, DDS;  Location: [facility];  Service: Oral Surgery;  Laterality: N/A;  . REFRACTIVE SURGERY    . RIGHT/LEFT HEART CATH AND CORONARY ANGIOGRAPHY N/A 02/09/2017   Procedure: RIGHT/LEFT HEART CATH AND CORONARY ANGIOGRAPHY;  Surgeon: Burnell Blanks, MD;  Location: [facility];  Service: Cardiovascular;  Laterality: N/A;  . TEE WITHOUT CARDIOVERSION N/A 03/29/2017   Procedure: TRANSESOPHAGEAL ECHOCARDIOGRAM (TEE);  Surgeon: [name], MD;  Location: [facility];  Service: Open Heart Surgery;  Laterality: N/A;  . TONSILLECTOMY    . TRANSCATHETER AORTIC VALVE REPLACEMENT, TRANSFEMORAL N/A 03/29/2017   Procedure: TRANSCATHETER AORTIC VALVE REPLACEMENT, TRANSFEMORAL;  Surgeon: [name], MD;  Location: [facility];  Service: Open Heart Surgery;  Laterality: N/A;    Prior to Admission medications   Medication Sig Start Date End Date Taking? Authorizing Provider  acetaminophen (TYLENOL) 500 MG tablet Take 500 mg by mouth every 6 (six) hours as needed for mild pain or moderate pain.   Yes [provider]  albuterol (PROVENTIL HFA;VENTOLIN HFA) 108 (90 Base) MCG/ACT inhaler Inhale 2 puffs every 6 (six) hours as needed into the lungs for wheezing or shortness of breath.    Yes [provider]  albuterol (PROVENTIL) (2.5 MG/3ML) 0.083% nebulizer solution Take 2.5 mg 3 (three) times daily as needed by nebulization for wheezing or shortness of breath.   Yes [provider]  amLODipine (NORVASC) 10 MG tablet Take 1 tablet (10 mg total) by mouth daily. 03/16/17  Yes Eileen Stanford, PA-C  atorvastatin (LIPITOR) 20 MG tablet Take 20 mg by mouth daily. 01/31/17  Yes [provider]  budesonide-formoterol (SYMBICORT) 160-4.5 MCG/ACT inhaler Inhale 2 puffs 2 (two) times daily as needed into the lungs (for shortness of breath).    Yes  [provider]  cetirizine (ZYRTEC) 10 MG tablet Take 10 mg at bedtime by mouth.    Yes [provider]  chlorhexidine (PERIDEX) 0.12 % solution Use as directed 15 mLs in the mouth or throat 2 (two) times daily. 03/16/17  Yes Eileen Stanford, PA-C  docusate sodium (COLACE) 100 MG capsule Take 100 mg 2 (two) times daily as needed by mouth for mild constipation.   Yes [provider]  ferrous sulfate 325 (65 FE) MG EC tablet Take 325 mg by mouth 2 (two) times daily.    Yes [provider]  HYDROcodone-acetaminophen (NORCO/VICODIN) 5-325 MG tablet Take 1-2  tablets by mouth every 6 (six) hours as needed for moderate pain. 03/16/17  Yes Angelena Form R, PA-C  ipratropium (ATROVENT) 0.02 % nebulizer solution Take 0.5 mg by nebulization 2 (two) times daily.   Yes [provider]  LANTUS SOLOSTAR 100 UNIT/ML Solostar Pen Inject 30 Units every morning into the skin.  10/29/16  Yes [provider]  lubiprostone (AMITIZA) 8 MCG capsule Take 8 mcg 2 (two) times daily as needed by mouth for constipation.    Yes [provider]  montelukast (SINGULAIR) 10 MG tablet Take 10 mg by mouth at bedtime.   Yes [provider]  Multiple Vitamins-Minerals (MULTIVITAMIN WITH MINERALS) tablet Take 1 tablet by mouth daily.   Yes [provider]  Naphazoline HCl (CLEAR EYES OP) Place 2 drops 2 (two) times daily into both eyes.   Yes [provider]  protein supplement shake (PREMIER PROTEIN) LIQD Take 325 mLs (11 oz total) by mouth 3 (three) times daily between meals. 03/16/17  Yes Eileen Stanford, PA-C  sodium chloride (OCEAN) 0.65 % SOLN nasal spray Place 2 sprays 2 (two) times daily as needed into both nostrils for congestion.    Yes [provider]  aspirin EC 81 MG tablet Take 81 mg by mouth daily.    [provider]  Dextran 70-Hypromellose (ARTIFICIAL TEARS) 0.1-0.3 % SOLN Place 2 drops as needed into  both eyes (for dry eyes).     [provider]    Scheduled Meds: . acetaminophen  1,000 mg Oral Q6H  . aspirin EC  81 mg Oral Daily  . atorvastatin  20 mg Oral Daily  . clopidogrel  75 mg Oral Q breakfast  . feeding supplement (ENSURE ENLIVE)  237 mL Oral BID BM  . ferrous sulfate  325 mg Oral BID  . insulin aspart  0-24 Units Subcutaneous Q4H  . insulin glargine  10 Units Subcutaneous Daily  . loratadine  10 mg Oral Daily  . mometasone-formoterol  2 puff Inhalation BID  . montelukast  10 mg Oral QHS  . pantoprazole  40 mg Oral Daily  . protein supplement shake  11 oz Oral TID BM   Infusions: . sodium chloride Stopped (03/30/17 0243)  . sodium chloride     PRN Meds: docusate sodium, fentaNYL (SUBLIMAZE) injection, lubiprostone, metoprolol tartrate, ondansetron (ZOFRAN) IV, traMADol   Allergies as of 03/10/2017  . (No Known Allergies)    Family History  Problem Relation Age of Onset  . Hypertension Mother   . Kidney failure Mother   . Heart disease Mother   . Hypertension Father   . Heart disease Father   . Diabetes Father   . Cancer Sister     Social History   Socioeconomic History  . Marital status: Widowed    Spouse name: Not on file  . Number of children: 0  . Years of education: Not on file  . Highest education level: Not on file  Social Needs  . Financial resource strain: Not on file  . Food insecurity - worry: Not on file  . Food insecurity - inability: Not on file  . Transportation needs - medical: Not on file  . Transportation needs - non-medical: Not on file  Occupational History  . Not on file  Tobacco Use  . Smoking status: Passive Smoke Exposure - Never Smoker  . Smokeless tobacco: Never Used  Substance and Sexual Activity  . Alcohol use: No    Frequency: Never  . Drug use:  No  . Sexual activity: No  Other Topics Concern  . Not on file  Social History Narrative       REVIEW OF SYSTEMS: Constitutional: Generally weak and  fatigues easily.  However overall she feels stronger than before her valve replacement surgery 2 days ago. ENT:  No nose bleeds Pulm: Shortness of breath has improved since her valve surgery no productive cough. CV:  No palpitations, no LE edema.  No chest pain GU:  No hematuria, no frequency GI:  Per HPI.  No dysphagia.  No heartburn.  No bleeding per rectum. Heme: No excessive or unusual bleeding. Transfusions: Recalls possible blood transfusion when she was at [facility] last month in 01/2017 but none before that. Neuro:  No headaches, no peripheral tingling or numbness.  No visual disturbance.  No syncope. Derm:  No itching, no rash or sores.  Endocrine:  No sweats or chills.  No polyuria or dysuria Immunization: Did not inquire as to recent immunizations. Travel:  None beyond local counties in last few months.    PHYSICAL EXAM: Vital signs in last 24 hours: Vitals:   03/30/17 1741 03/30/17 1948  BP: (!) 135/59 (!) 106/47  Pulse: 88 83  Resp:  19  Temp: 97.7 F (36.5 C) 97.7 F (36.5 C)  SpO2: 99% 100%   Wt Readings from Last 3 Encounters:  03/31/17 46.7 kg (103 lb)  03/18/17 47.7 kg (105 lb 1.6 oz)  03/16/17 46.4 kg (102 lb 6.4 oz)    General: Frail-appearing, pleasant, comfortable, elderly WF Head: No facial asymmetry.  No signs of head trauma. Eyes: No scleral icterus.  No conjunctival pallor.  EOMI. Ears: Not hard of hearing. Nose: No congestion or discharge. Mouth: Moist and clear oropharynx.  Edentulous.  No blood.  Tongue midline. Neck: No JVD, no masses, no thyromegaly. Lungs: Clear bilaterally.  Not short of breath.  No cough.  There is a large circular soft mass beneath the skin in the left thorax, looks and feels like a lipoma (patient says it has been there for a long time) Heart: RRR.  No MRG.  S1, S2 present. Abdomen: Soft.  Diffusely, mildly tender.  No guarding or rebound.  No hernias, masses, organomegaly or bruits..   Rectal: Scant amount of  stool is dark in color and is trace FOBT+ Musc/Skeltl: No joint redness or swelling. Extremities: No CCE.  Feet are warm with good perfusion. Neurologic: The patient is alert and oriented x3.  She is a good historian.  Moves all 4 limbs with grossly normal strength.  No tremor. Skin: Moderate, non-extensive, bruising at both groins.  Tattoos: None Nodes: No cervical or inguinal adenopathy. Psych: Calm, pleasant, cooperative.  In good spirits.  Intake/Output from previous day: 12/05 0701 - 12/06 0700 In: 713.7 [P.O.:710; I.V.:3.7] Out: 400 [Urine:400] Intake/Output this shift: No intake/output data recorded.  LAB RESULTS: Recent Labs    03/29/17 1746  03/29/17 2228 03/30/17 0430 03/31/17 0358  WBC 7.0  --   --  6.2 5.9  HGB 9.1*   < > 10.7* 10.5* 9.7*  HCT 28.5*   < > 33.1* 32.3* 29.8*  PLT 145*  --   --  195 118*   < > = values in this interval not displayed.   BMET Lab Results  Component Value Date   NA 139 03/31/2017   NA 139 03/30/2017   NA 141 03/29/2017   K 3.9 03/31/2017   K 5.2 (H) 03/30/2017   K 4.8 03/29/2017  CL 109 03/31/2017   CL 105 03/30/2017   CL 107 03/29/2017   CO2 22 03/31/2017   CO2 21 (L) 03/30/2017   CO2 25 03/29/2017   GLUCOSE 65 03/31/2017   GLUCOSE 115 (H) 03/30/2017   GLUCOSE 159 (H) 03/29/2017   BUN 81 (H) 03/31/2017   BUN 78 (H) 03/30/2017   BUN 65 (H) 03/29/2017   CREATININE 2.18 (H) 03/31/2017   CREATININE 2.14 (H) 03/30/2017   CREATININE 1.90 (H) 03/29/2017   CALCIUM 9.0 03/31/2017   CALCIUM 9.2 03/30/2017   CALCIUM 9.6 03/29/2017   LFT No results for input(s): PROT, ALBUMIN, AST, ALT, ALKPHOS, BILITOT, BILIDIR, IBILI in the last 72 hours. PT/INR Lab Results  Component Value Date   INR 1.31 03/29/2017   INR 1.17 03/28/2017   INR 1.09 03/22/2017   Hepatitis Panel No results for input(s): HEPBSAG, HCVAB, HEPAIGM, HEPBIGM in the last 72 hours. C-Diff No components found for: CDIFF Lipase  No results found for:  LIPASE  Drugs of Abuse  No results found for: LABOPIA, COCAINSCRNUR, LABBENZ, AMPHETMU, THCU, LABBARB   RADIOLOGY STUDIES: Dg Chest Port 1 View  Result Date: 03/29/2017 CLINICAL DATA:  Post TAVR, history asthma, coronary disease, CHF, COPD, diabetes mellitus EXAM: PORTABLE CHEST 1 VIEW COMPARISON:  Portable exam 1744 hours compared 03/25/2017 FINDINGS: RIGHT jugular catheter with tip projecting over RIGHT atrium. Rotated to the LEFT. Upper normal size of cardiac silhouette post TAVR. Mediastinal contours and pulmonary vascularity normal. Atherosclerotic calcification aorta. Bibasilar atelectasis greater on LEFT. Lungs otherwise clear. No definite infiltrate, pleural effusion or pneumothorax. Bones demineralized. IMPRESSION: Bibasilar atelectasis greater on LEFT. Electronically Signed   By: Lavonia Dana M.D.   On: 03/29/2017 18:13     IMPRESSION:   *  FOBT + anemia in pt 2 days s/p TAVR.  On Plavix and low dose ASA. She has required a total of 4 units of packed red blood cells in the last 3 weeks.  Was treated a couple of months ago with what sounds like Epogen infusion guided by renal MD in [place].  Has been taking chronic oral iron for at least a year.  Patient reports colonoscopy with polypectomy in 01/2017. Her anemia is multifactorial and includes her advanced kidney disease, bleeding from multiple dental extractions, TAVR catheterization related blood loss and possibly from yet to be determined GI sources.  *Choledocholithiasis.  Status post ERCP (Endoscopicretrogradecholangiopancreatography) and EUS in 09/2016.  At her first ERCP (Endoscopicretrogradecholangiopancreatography) in [place] she required placement of pancreatic duct stent which she passed in her stool.  At the second ERCP (Endoscopicretrogradecholangiopancreatography) she underwent sphincterotomy and clearance of biliary sludge. Cholecystectomy plans put on way back burner due to severe aortic stenosis.     PLAN:     *   EGD  Would be diagnostic given Plavix on board.   Agree with addition of oral Protonix today.     Azucena Freed  03/31/2017, 11:16 AM Pager: [phone]  ________________________________________________________________________  Velora Heckler GI MD note:  I personally examined the patient, reviewed the data and agree with the assessment and plan described above.  She has significant heme + anemia. Colonoscopy last month in [place] found some polyps per patient.  Sounds like a lot of oral bleeding after 22 teeth pulls a few weeks ago.  Will plan on EGD tomorrow to rule out other potential causes of her heme + anemia (ulcer, neoplasm, etc).   Owens Loffler, MD [facility] Pager [phone]

## 2017-03-31 NOTE — Progress Notes (Signed)
Initial Nutrition Assessment  DOCUMENTATION CODES:   Not applicable  INTERVENTION:    Continue Ensure Enlive po BID, each supplement provides 350 kcal and 20 grams of protein  NUTRITION DIAGNOSIS:   Increased nutrient needs related to chronic illness as evidenced by estimated needs  GOAL:   Patient will meet greater than or equal to 90% of their needs  MONITOR:   PO intake, Supplement acceptance, Labs, Weight trends, Skin  REASON FOR ASSESSMENT:   Malnutrition Screening Tool  ASSESSMENT:   71 y.o. Female with a history of CKD stage IV, insulin dependant DMT2 with multiple complications, HTN, asthma, COPD 2/2 second hand smoke exposure, chronic diastolic CHF, chronic anemia, severe aortic stenosis and recent extensive dental extractions who presented to [facility]17 for planned TAVR. The patient appeared weak and pale prior to surgery so STAT labs were ordered which revealed acute blood loss anemia and AKI. She was admitted for further observation and work up.  Pt s/p procedure 12/4: TRANSCATHETER AORTIC VALVE REPLACEMENT  RD spoke with pt in room. Pt sitting in her recliner drinking an Ensure Enlive supplement. Reports her appetite isn't very good. She states "I've been too tired and too cold to eat". Typically consumes 3 meals and 1 Ensure supplement per day at home.   Pt has had a 6% weight loss since 01/2017. Not significant for time frame. Labs and medications reviewed. CBG's [id number].  NUTRITION - FOCUSED PHYSICAL EXAM:    Most Recent Value  Orbital Region  No depletion  Upper Arm Region  Mild depletion  Thoracic and Lumbar Region  No depletion  Buccal Region  No depletion  Temple Region  No depletion  Clavicle Bone Region  Mild depletion  Clavicle and Acromion Bone Region  No depletion  Scapular Bone Region  No depletion  Dorsal Hand  No depletion  Patellar Region  Mild depletion  Anterior Thigh Region  Mild depletion  Posterior Calf Region  Mild  depletion  Edema (RD Assessment)  None    Diet Order:  Diet heart healthy/carb modified Room service appropriate? Yes; Fluid consistency: Thin Diet NPO time specified  EDUCATION NEEDS:   No education needs have been identified at this time  Skin:  Skin Assessment: Skin Integrity Issues: Skin Integrity Issues:: Stage I Stage I: bilateral heels and sacrum  Last BM:  12/5  Height:   Ht Readings from Last 1 Encounters:  03/22/17 4\' 10"  (1.473 m)   Weight:   Wt Readings from Last 1 Encounters:  03/31/17 103 lb (46.7 kg)    Wt Readings from Last 15 Encounters:  03/31/17 103 lb (46.7 kg)  03/18/17 105 lb 1.6 oz (47.7 kg)  03/16/17 102 lb 6.4 oz (46.4 kg)  03/08/17 110 lb (49.9 kg)  03/07/17 110 lb (49.9 kg)  02/09/17 110 lb (49.9 kg)  02/04/17 110 lb (49.9 kg)   Ideal Body Weight:  44.1 kg  BMI:  Body mass index is 21.53 kg/m.  Estimated Nutritional Needs:   Kcal:  1300-1500  Protein:  60-75 gm  Fluid:  >/= 1.5 L  Arthur Holms, RD, LDN Pager #: [phone] After-Hours Pager #: [phone]

## 2017-03-31 NOTE — Progress Notes (Addendum)
[facility] VALVE TEAM  Patient Name: Sonya Carpenter Date of Encounter: 03/31/2017  Primary Cardiologist: Dr.Zachary (Angelina Sheriff, [place]) / Dr. Burt Knack & Dr. Roxy Manns (TAVR)   Hospital Problem List     Principal Problem:   S/P TAVR (transcatheter aortic valve replacement) Active Problems:   Severe aortic stenosis   CKD (chronic kidney disease) stage 4, GFR 15-29 ml/min ([place])   Hypertension   Type 2 diabetes mellitus without complication, with long-term current use of insulin (HCC)   Anemia, iron deficiency   Acute blood loss anemia   Mitral stenosis   Acute on chronic diastolic heart failure (HCC)   Pressure injury of skin     Subjective   Feeling okay this morning. Saw blood in stool yesterday. Still very weak but thinks breathing has improved.   Inpatient Medications    Scheduled Meds: . acetaminophen  1,000 mg Oral Q6H  . aspirin EC  81 mg Oral Daily  . atorvastatin  20 mg Oral Daily  . clopidogrel  75 mg Oral Q breakfast  . feeding supplement (ENSURE ENLIVE)  237 mL Oral BID BM  . ferrous sulfate  325 mg Oral BID  . insulin aspart  0-24 Units Subcutaneous Q4H  . insulin glargine  10 Units Subcutaneous Daily  . loratadine  10 mg Oral Daily  . mometasone-formoterol  2 puff Inhalation BID  . montelukast  10 mg Oral QHS  . pantoprazole  40 mg Oral Daily  . protein supplement shake  11 oz Oral TID BM   Continuous Infusions: . sodium chloride Stopped (03/30/17 0243)  . sodium chloride     PRN Meds: docusate sodium, fentaNYL (SUBLIMAZE) injection, lubiprostone, metoprolol tartrate, ondansetron (ZOFRAN) IV, traMADol   Vital Signs    Vitals:   03/30/17 1600 03/30/17 1741 03/30/17 1948 03/31/17 0550  BP: (!) 121/54 (!) 135/59 (!) 106/47   Pulse: 86 88 83   Resp: 17  19   Temp:  97.7 F (36.5 C) 97.7 F (36.5 C)   TempSrc:  Oral Oral   SpO2: 98% 99% 100%   Weight:    103 lb (46.7 kg)  Height:        Intake/Output Summary  (Last 24 hours) at 03/31/2017 0852 Last data filed at 03/30/2017 2355 Gross per 24 hour  Intake 350 ml  Output 300 ml  Net 50 ml   Filed Weights   03/29/17 1800 03/30/17 0500 03/31/17 0550  Weight: 100 lb 15.5 oz (45.8 kg) 102 lb 12.8 oz (46.6 kg) 103 lb (46.7 kg)    Physical Exam   General: chronically ill appearing. Elderly and frail appearing WF HEENT: normal Lymph: no adenopathy Neck: no JVD Endocrine:  No thryomegaly Vascular: No carotid bruits; FA pulses 2+ bilaterally without bruits  Cardiac:  normal S1, S2; RRR; no murmur  Lungs:  clear to auscultation bilaterally. Few, faint wheezing. No rhonchi or rales  Abd: soft, nontender, no hepatomegaly  Ext: no edema Musculoskeletal:  No deformities, BUE and BLE strength normal and equal Skin: warm and dry. Groin sites are stable.  Neuro:  CNs 2-12 intact, no focal abnormalities noted Psych:  Normal affect    Labs    CBC Recent Labs    03/30/17 0430 03/31/17 0358  WBC 6.2 5.9  HGB 10.5* 9.7*  HCT 32.3* 29.8*  MCV 87.5 88.4  PLT 195 308*   Basic Metabolic Panel Recent Labs    03/30/17 0430 03/31/17 0358  NA 139 139  K 5.2* 3.9  CL 105 109  CO2 21* 22  GLUCOSE 115* 65  BUN 78* 81*  CREATININE 2.14* 2.18*  CALCIUM 9.2 9.0  MG 2.0  --    Liver Function Tests No results for input(s): AST, ALT, ALKPHOS, BILITOT, PROT, ALBUMIN in the last 72 hours. No results for input(s): LIPASE, AMYLASE in the last 72 hours. Cardiac Enzymes No results for input(s): CKTOTAL, CKMB, CKMBINDEX, TROPONINI in the last 72 hours. BNP Invalid input(s): POCBNP D-Dimer No results for input(s): DDIMER in the last 72 hours. Hemoglobin A1C No results for input(s): HGBA1C in the last 72 hours. Fasting Lipid Panel No results for input(s): CHOL, HDL, LDLCALC, TRIG, CHOLHDL, LDLDIRECT in the last 72 hours. Thyroid Function Tests No results for input(s): TSH, T4TOTAL, T3FREE, THYROIDAB in the last 72 hours.  Invalid input(s):  FREET3  Telemetry    sinus - Personally Reviewed  ECG   NSR HR 77  Radiology    Dg Chest Port 1 View  Result Date: 03/29/2017 CLINICAL DATA:  Post TAVR, history asthma, coronary disease, CHF, COPD, diabetes mellitus EXAM: PORTABLE CHEST 1 VIEW COMPARISON:  Portable exam 1744 hours compared 03/25/2017 FINDINGS: RIGHT jugular catheter with tip projecting over RIGHT atrium. Rotated to the LEFT. Upper normal size of cardiac silhouette post TAVR. Mediastinal contours and pulmonary vascularity normal. Atherosclerotic calcification aorta. Bibasilar atelectasis greater on LEFT. Lungs otherwise clear. No definite infiltrate, pleural effusion or pneumothorax. Bones demineralized. IMPRESSION: Bibasilar atelectasis greater on LEFT. Electronically Signed   By: Lavonia Dana M.D.   On: 03/29/2017 18:13    Cardiac Studies   TAVR OPERATIVE NOTE   Date of Procedure:                03/30/2017  Preoperative Diagnosis:      Severe Aortic Stenosis   Postoperative Diagnosis:    Same   Procedure:        Transcatheter Aortic Valve Replacement - Open Transfemoral Approach             Edwards Sapien 3 THV (size 23 mm, model # 9600TFX, serial # [id number])              Co-Surgeons:                        Valentina Gu. Roxy Manns, MD and [name], MD  Anesthesiologist:                  Belinda Block, MD  Echocardiographer:              Loralie Champagne, MD  Pre-operative Echo Findings: ?  Severe aortic stenosis ? Normal left ventricular systolic function  Post-operative Echo Findings: ? trace paravalvular leak ? normal left ventricular systolic function  _________________  Post operative echo: 03/30/17  Study Conclusions - Left ventricle: The cavity size was normal. Wall thickness was   normal. Systolic function was normal. The estimated ejection   fraction was in the range of 60% to 65%. Wall motion was normal;   there were no regional wall motion abnormalities. Doppler   parameters are  consistent with abnormal left ventricular   relaxation (grade 1 diastolic dysfunction). - Aortic valve: A bioprosthesis was present. Valve area (VTI): 1.23   cm^2. Valve area (Vmax): 1.07 cm^2. Valve area (Vmean): 1.2 cm^2. - Mitral valve: Mildly calcified annulus. Moderately thickened,   moderately calcified leaflets . The findings are consistent with   severe stenosis. Valve area by pressure  half-time: 2.14 cm^2.   Valve area by continuity equation (using LVOT flow): 1.03 cm^2. - Left atrium: The atrium was severely dilated. - Tricuspid valve: There was mild-moderate regurgitation.   Patient Profile     Sonya Carpenter is a 71 y.o. 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 The patient appeared weak and pale prior to surgery so STAT labs were ordered which revealed acute blood loss anemia and AKI. She was admitted for further observation and work up. She has remained inpatient until TAVR on 03/29/17.  Assessment & Plan   Severe AS: TAVR cancelled last Tuesday given AKI and acute anemia. She was kept inpatient for close monitoring. Hg and creat stabilized and she underwent successful TAVR with a 80mm Edwards Sapien 3 THV via femoral cut down on 03/29/17. Post operative echo showed a normal functioning aortic valve with mean gradient 17 mm Hg and peak gradient 36 mm Hg. ECG with NSR. Groin sites are stable today. Continue ASA and plavix.    Acute blood loss anemia: HgB 5.4 on admission (down from 9.9) mg/dL last week. Felt to be 2/2 dental extractions with significant blood loss. She received 2 U PRBCS on 11/27. Hg remained relatively stable but then started to drift down. She had post op anemia after TAVR requiring a transfusion of 1 U and now Hg at  10.5--> 9.7. Repeat FOBT was positive and I have asked GI to see her for potential scoping. Of note, she would require IV peri procedure antibiotics if she requires a scope so soon after TAVR.  Acute on chronic diastolic CHF: 2/2 volume resuscitation with IVF and blood products. CXR showed persistent pulmonary vascular congestion. She treated with several doses of IV lasix. She appears euvolemic today. Hopefully this will improve s/p TAVR.   AKI: creatinine 3.02 on admission (up from 2.39) mg/dL last week. Likely from hypovolemia in the setting of acute blood loss. Creat stable at 2.18 today.  DMT2: continue SSI and basal lantus  Dispo: she has very little social support at home and very weak and frail. She will need to go to SNF at discharge. Insurance denied initial request, but OT evaluating and we will appeal denial.   Signed, Angelena Form, PA-C  03/31/2017, 8:52 AM  Pager [phone]   I have seen and examined the patient and agree with the assessment and plan as outlined.  Rexene Alberts, MD 03/31/2017 3:29 PM

## 2017-03-31 NOTE — Progress Notes (Signed)
CSW received call from cardiothoracic PA who states she did peer to peer for patient and she was approved for 5 days.  CSW updated unit CSW who is currently following pt case  CSW left message for admissions at [facility] to inform  Jorge Ny, [place] Social Worker [phone]

## 2017-03-31 NOTE — Progress Notes (Signed)
CARDIAC REHAB PHASE I   PRE:  Rate/Rhythm: 90 SR  BP:  Supine:   Sitting: 106/50  Standing:    SaO2: 98%RA  MODE:  Ambulation: 200 ft   POST:  Rate/Rhythm: 113 ST  BP:  Supine:   Sitting: 131/53  Standing:    SaO2: 99%RA 1131-1155 Pt walked 200 ft with rolling walker and asst x 1 with steady gait. To bathroom after walk. Stated she felt a little weak this walk. To recliner with call bell.   Graylon Good, RN BSN  03/31/2017 11:52 AM

## 2017-03-31 NOTE — Progress Notes (Signed)
Inpatient Diabetes Program Recommendations  AACE/ADA: New Consensus Statement on Inpatient Glycemic Control (2015)  Target Ranges:  Prepandial:   less than 140 mg/dL      Peak postprandial:   less than 180 mg/dL (1-2 hours)      Critically ill patients:  140 - 180 mg/dL   Lab Results  Component Value Date   GLUCAP 161 (H) 03/31/2017   HGBA1C 7.4 (H) 03/18/2017    Review of Glycemic Control  Had mild hypoglycemia this am.  Inpatient Diabetes Program Recommendations:     Decrease Lantus to 8 units QHS.  Will continue to follow.  Thank you. Lorenda Peck, RD, LDN, CDE Inpatient Diabetes Coordinator [phone]

## 2017-03-31 NOTE — H&P (View-Only) (Signed)
[facility] Consult: 11:16 AM 03/31/2017  LOS: 9 days    Referring Provider: Dr Roxy Manns  Primary Care Physician:  Michell Heinrich, DO Primary Gastroenterologist: unassigned.  Dr. Posey Pronto in [place]    Reason for Consultation:  FOBT + anemia   HPI: Sonya Carpenter is a 71 y.o. female.  CKD stage 4.  IDDM2.  Diastolic CHF.  Thrombocytopenia (117 on 02/04/17, ).  Anemia of CKD.  Has received epo per renal docs at outpt center in [place] and taking iron for at least 1 year.  S/p 01/2017 Colonoscopy in [place], Dr Posey Pronto: polyps of unclear type removed. LE cellulitis.   09/2016 admitted to hospital in [place] with right upper quadrant pain, lower extremity cellulitis.HIDA scan positive for chronic cholecystitis.  09/30/16 MRCP revealed diffuse dilation of the common bile duct to 17 mm with abrupt narrowing of the CBD at the level of the ampulla and a questional 7 mm filling defect in this region which could represent a mass or stone. Tiny gallstones present in the gall bladder fundus. S/p  10/01/16 ERCP with pancreatic duct stent placement, MD unable to cannulate CBD.   Transferred to [facility] for tertiary biliary expertise.  S/p 10/04/16 ERCP/EUS revealing significant dilation of main bile duct and biliary sludge; underwent sphincterotomy and multiple balloon sweeps with removal of sludge.  Pancreatic duct was monitored with x-ray and ultimately she passed the pancreatic stent in her stool on 6/14.  Discharged to SNF.   Elective cholecystectomy planned, but in pre-surgical clearance, previously moderate Ao Stenosis determined to be critical and referred to [facility] for TAVR.  Admissions to [facility] before and after the ERCP for pneumonia, pleural effusion requiring thoracentesis. It was during the earlier [facility] admission  that work up of "possible" abdominal mass, biliary ductal dilatation, that she was referred to [facility].     Patient was set up for TAVR last week.  She had undergone prophylactic dental extractions of 22 teeth on 03/14/17.  That day labs revealed Hgb of 8.5 and AKI.  Transfused 1 U PRBC to 9.5. FOBT negative on 11/20.  Discharged to home.  Had a lot of oral bleeding for up to 1 week after extractions.  Stools chronically formed, black as she takes iron BID.  Returned for TAVR 11/27, but it was postponed due to anemia, ultimately  performed 12/4.   Hgb 5.4 on 11/27.  Transfused 2 U PRBC.  Platelets 96 K.   03/29/17 Hgb went from 9.1 to 7.8 and transfused with 1 U PRBC (her 3rd unit in total during this admission.).  She has now tested FOBT + x 2 beginning 12/5.    Taking 81 ASA since 12/3 and chronically at home.  On Plavix since 12/5.  Neither on hold.   No PPI in place and no PPI at home.  No NSAIDs  Has some nausea, anorexia (several weeks).  Some mid abdominal pain and pain in groins.       Past Medical History:  Diagnosis Date  . Anemia   . Aortic stenosis   .  Arthritis    back  . Asthma   . CAD (coronary artery disease)   . Chronic diastolic congestive heart failure ([place])   . CKD (chronic kidney disease), stage IV ([place])   . COPD (chronic obstructive pulmonary disease) ([place])   . Diabetes mellitus without complication ([place])    type 2  . Gallbladder disease   . Headache   . Hyperlipidemia   . Hypertension   . Mitral stenosis   . Pneumonia   . 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 Vitamin D deficiency     Past Surgical History:  Procedure Laterality Date  . BREAST CYST EXCISION Right   . COLONSCOPY    . ELBOW SURGERY Left   . KNEE SURGERY    . MULTIPLE EXTRACTIONS WITH ALVEOLOPLASTY N/A 03/14/2017   Procedure: Extraction of tooth #'s 2-11, 15, 19-28 and 31  with alveoloplasty, bilateral mandibular tori reductions and bilateral mandibular lingual exostoses reductions.;  Surgeon: Lenn Cal, DDS;  Location: [facility];  Service: Oral Surgery;  Laterality: N/A;  . REFRACTIVE SURGERY    . RIGHT/LEFT HEART CATH AND CORONARY ANGIOGRAPHY N/A 02/09/2017   Procedure: RIGHT/LEFT HEART CATH AND CORONARY ANGIOGRAPHY;  Surgeon: Burnell Blanks, MD;  Location: [place] [facility];  Service: Cardiovascular;  Laterality: N/A;  . TEE WITHOUT CARDIOVERSION N/A 03/29/2017   Procedure: TRANSESOPHAGEAL ECHOCARDIOGRAM (TEE);  Surgeon: [name], MD;  Location: [facility];  Service: Open Heart Surgery;  Laterality: N/A;  . TONSILLECTOMY    . TRANSCATHETER AORTIC VALVE REPLACEMENT, TRANSFEMORAL N/A 03/29/2017   Procedure: TRANSCATHETER AORTIC VALVE REPLACEMENT, TRANSFEMORAL;  Surgeon: [name], MD;  Location: [facility];  Service: Open Heart Surgery;  Laterality: N/A;    Prior to Admission medications   Medication Sig Start Date End Date Taking? Authorizing Provider  acetaminophen (TYLENOL) 500 MG tablet Take 500 mg by mouth every 6 (six) hours as needed for mild pain or moderate pain.   Yes [provider]  albuterol (PROVENTIL HFA;VENTOLIN HFA) 108 (90 Base) MCG/ACT inhaler Inhale 2 puffs every 6 (six) hours as needed into the lungs for wheezing or shortness of breath.    Yes [provider]  albuterol (PROVENTIL) (2.5 MG/3ML) 0.083% nebulizer solution Take 2.5 mg 3 (three) times daily as needed by nebulization for wheezing or shortness of breath.   Yes [provider]  amLODipine (NORVASC) 10 MG tablet Take 1 tablet (10 mg total) by mouth daily. 03/16/17  Yes Eileen Stanford, PA-C  atorvastatin (LIPITOR) 20 MG tablet Take 20 mg by mouth daily. 01/31/17  Yes [provider]  budesonide-formoterol (SYMBICORT) 160-4.5 MCG/ACT inhaler Inhale 2 puffs 2 (two) times daily as needed into the lungs (for shortness of breath).    Yes  [provider]  cetirizine (ZYRTEC) 10 MG tablet Take 10 mg at bedtime by mouth.    Yes [provider]  chlorhexidine (PERIDEX) 0.12 % solution Use as directed 15 mLs in the mouth or throat 2 (two) times daily. 03/16/17  Yes Eileen Stanford, PA-C  docusate sodium (COLACE) 100 MG capsule Take 100 mg 2 (two) times daily as needed by mouth for mild constipation.   Yes [provider]  ferrous sulfate 325 (65 FE) MG EC tablet Take 325 mg by mouth 2 (two) times daily.    Yes [provider]  HYDROcodone-acetaminophen (NORCO/VICODIN) 5-325 MG tablet Take 1-2  tablets by mouth every 6 (six) hours as needed for moderate pain. 03/16/17  Yes Angelena Form R, PA-C  ipratropium (ATROVENT) 0.02 % nebulizer solution Take 0.5 mg by nebulization 2 (two) times daily.   Yes [provider]  LANTUS SOLOSTAR 100 UNIT/ML Solostar Pen Inject 30 Units every morning into the skin.  10/29/16  Yes [provider]  lubiprostone (AMITIZA) 8 MCG capsule Take 8 mcg 2 (two) times daily as needed by mouth for constipation.    Yes [provider]  montelukast (SINGULAIR) 10 MG tablet Take 10 mg by mouth at bedtime.   Yes [provider]  Multiple Vitamins-Minerals (MULTIVITAMIN WITH MINERALS) tablet Take 1 tablet by mouth daily.   Yes [provider]  Naphazoline HCl (CLEAR EYES OP) Place 2 drops 2 (two) times daily into both eyes.   Yes [provider]  protein supplement shake (PREMIER PROTEIN) LIQD Take 325 mLs (11 oz total) by mouth 3 (three) times daily between meals. 03/16/17  Yes Eileen Stanford, PA-C  sodium chloride (OCEAN) 0.65 % SOLN nasal spray Place 2 sprays 2 (two) times daily as needed into both nostrils for congestion.    Yes [provider]  aspirin EC 81 MG tablet Take 81 mg by mouth daily.    [provider]  Dextran 70-Hypromellose (ARTIFICIAL TEARS) 0.1-0.3 % SOLN Place 2 drops as needed into  both eyes (for dry eyes).     [provider]    Scheduled Meds: . acetaminophen  1,000 mg Oral Q6H  . aspirin EC  81 mg Oral Daily  . atorvastatin  20 mg Oral Daily  . clopidogrel  75 mg Oral Q breakfast  . feeding supplement (ENSURE ENLIVE)  237 mL Oral BID BM  . ferrous sulfate  325 mg Oral BID  . insulin aspart  0-24 Units Subcutaneous Q4H  . insulin glargine  10 Units Subcutaneous Daily  . loratadine  10 mg Oral Daily  . mometasone-formoterol  2 puff Inhalation BID  . montelukast  10 mg Oral QHS  . pantoprazole  40 mg Oral Daily  . protein supplement shake  11 oz Oral TID BM   Infusions: . sodium chloride Stopped (03/30/17 0243)  . sodium chloride     PRN Meds: docusate sodium, fentaNYL (SUBLIMAZE) injection, lubiprostone, metoprolol tartrate, ondansetron (ZOFRAN) IV, traMADol   Allergies as of 03/10/2017  . (No Known Allergies)    Family History  Problem Relation Age of Onset  . Hypertension Mother   . Kidney failure Mother   . Heart disease Mother   . Hypertension Father   . Heart disease Father   . Diabetes Father   . Cancer Sister     Social History   Socioeconomic History  . Marital status: Widowed    Spouse name: Not on file  . Number of children: 0  . Years of education: Not on file  . Highest education level: Not on file  Social Needs  . Financial resource strain: Not on file  . Food insecurity - worry: Not on file  . Food insecurity - inability: Not on file  . Transportation needs - medical: Not on file  . Transportation needs - non-medical: Not on file  Occupational History  . Not on file  Tobacco Use  . Smoking status: Passive Smoke Exposure - Never Smoker  . Smokeless tobacco: Never Used  Substance and Sexual Activity  . Alcohol use: No    Frequency: Never  . Drug use:  No  . Sexual activity: No  Other Topics Concern  . Not on file  Social History Narrative       REVIEW OF SYSTEMS: Constitutional: Generally weak and  fatigues easily.  However overall she feels stronger than before her valve replacement surgery 2 days ago. ENT:  No nose bleeds Pulm: Shortness of breath has improved since her valve surgery no productive cough. CV:  No palpitations, no LE edema.  No chest pain GU:  No hematuria, no frequency GI:  Per HPI.  No dysphagia.  No heartburn.  No bleeding per rectum. Heme: No excessive or unusual bleeding. Transfusions: Recalls possible blood transfusion when she was at [facility] last month in 01/2017 but none before that. Neuro:  No headaches, no peripheral tingling or numbness.  No visual disturbance.  No syncope. Derm:  No itching, no rash or sores.  Endocrine:  No sweats or chills.  No polyuria or dysuria Immunization: Did not inquire as to recent immunizations. Travel:  None beyond local counties in last few months.    PHYSICAL EXAM: Vital signs in last 24 hours: Vitals:   03/30/17 1741 03/30/17 1948  BP: (!) 135/59 (!) 106/47  Pulse: 88 83  Resp:  19  Temp: 97.7 F (36.5 C) 97.7 F (36.5 C)  SpO2: 99% 100%   Wt Readings from Last 3 Encounters:  03/31/17 46.7 kg (103 lb)  03/18/17 47.7 kg (105 lb 1.6 oz)  03/16/17 46.4 kg (102 lb 6.4 oz)    General: Frail-appearing, pleasant, comfortable, elderly WF Head: No facial asymmetry.  No signs of head trauma. Eyes: No scleral icterus.  No conjunctival pallor.  EOMI. Ears: Not hard of hearing. Nose: No congestion or discharge. Mouth: Moist and clear oropharynx.  Edentulous.  No blood.  Tongue midline. Neck: No JVD, no masses, no thyromegaly. Lungs: Clear bilaterally.  Not short of breath.  No cough.  There is a large circular soft mass beneath the skin in the left thorax, looks and feels like a lipoma (patient says it has been there for a long time) Heart: RRR.  No MRG.  S1, S2 present. Abdomen: Soft.  Diffusely, mildly tender.  No guarding or rebound.  No hernias, masses, organomegaly or bruits..   Rectal: Scant amount of  stool is dark in color and is trace FOBT+ Musc/Skeltl: No joint redness or swelling. Extremities: No CCE.  Feet are warm with good perfusion. Neurologic: The patient is alert and oriented x3.  She is a good historian.  Moves all 4 limbs with grossly normal strength.  No tremor. Skin: Moderate, non-extensive, bruising at both groins.  Tattoos: None Nodes: No cervical or inguinal adenopathy. Psych: Calm, pleasant, cooperative.  In good spirits.  Intake/Output from previous day: 12/05 0701 - 12/06 0700 In: 713.7 [P.O.:710; I.V.:3.7] Out: 400 [Urine:400] Intake/Output this shift: No intake/output data recorded.  LAB RESULTS: Recent Labs    03/29/17 1746  03/29/17 2228 03/30/17 0430 03/31/17 0358  WBC 7.0  --   --  6.2 5.9  HGB 9.1*   < > 10.7* 10.5* 9.7*  HCT 28.5*   < > 33.1* 32.3* 29.8*  PLT 145*  --   --  195 118*   < > = values in this interval not displayed.   BMET Lab Results  Component Value Date   NA 139 03/31/2017   NA 139 03/30/2017   NA 141 03/29/2017   K 3.9 03/31/2017   K 5.2 (H) 03/30/2017   K 4.8 03/29/2017  CL 109 03/31/2017   CL 105 03/30/2017   CL 107 03/29/2017   CO2 22 03/31/2017   CO2 21 (L) 03/30/2017   CO2 25 03/29/2017   GLUCOSE 65 03/31/2017   GLUCOSE 115 (H) 03/30/2017   GLUCOSE 159 (H) 03/29/2017   BUN 81 (H) 03/31/2017   BUN 78 (H) 03/30/2017   BUN 65 (H) 03/29/2017   CREATININE 2.18 (H) 03/31/2017   CREATININE 2.14 (H) 03/30/2017   CREATININE 1.90 (H) 03/29/2017   CALCIUM 9.0 03/31/2017   CALCIUM 9.2 03/30/2017   CALCIUM 9.6 03/29/2017   LFT No results for input(s): PROT, ALBUMIN, AST, ALT, ALKPHOS, BILITOT, BILIDIR, IBILI in the last 72 hours. PT/INR Lab Results  Component Value Date   INR 1.31 03/29/2017   INR 1.17 03/28/2017   INR 1.09 03/22/2017   Hepatitis Panel No results for input(s): HEPBSAG, HCVAB, HEPAIGM, HEPBIGM in the last 72 hours. C-Diff No components found for: CDIFF Lipase  No results found for:  LIPASE  Drugs of Abuse  No results found for: LABOPIA, COCAINSCRNUR, LABBENZ, AMPHETMU, THCU, LABBARB   RADIOLOGY STUDIES: Dg Chest Port 1 View  Result Date: 03/29/2017 CLINICAL DATA:  Post TAVR, history asthma, coronary disease, CHF, COPD, diabetes mellitus EXAM: PORTABLE CHEST 1 VIEW COMPARISON:  Portable exam 1744 hours compared 03/25/2017 FINDINGS: RIGHT jugular catheter with tip projecting over RIGHT atrium. Rotated to the LEFT. Upper normal size of cardiac silhouette post TAVR. Mediastinal contours and pulmonary vascularity normal. Atherosclerotic calcification aorta. Bibasilar atelectasis greater on LEFT. Lungs otherwise clear. No definite infiltrate, pleural effusion or pneumothorax. Bones demineralized. IMPRESSION: Bibasilar atelectasis greater on LEFT. Electronically Signed   By: Lavonia Dana M.D.   On: 03/29/2017 18:13     IMPRESSION:   *  FOBT + anemia in pt 2 days s/p TAVR.  On Plavix and low dose ASA. She has required a total of 4 units of packed red blood cells in the last 3 weeks.  Was treated a couple of months ago with what sounds like Epogen infusion guided by renal MD in [place].  Has been taking chronic oral iron for at least a year.  Patient reports colonoscopy with polypectomy in 01/2017. Her anemia is multifactorial and includes her advanced kidney disease, bleeding from multiple dental extractions, TAVR catheterization related blood loss and possibly from yet to be determined GI sources.  *Choledocholithiasis.  Status post ERCP (Endoscopicretrogradecholangiopancreatography) and EUS in 09/2016.  At her first ERCP (Endoscopicretrogradecholangiopancreatography) in [place] she required placement of pancreatic duct stent which she passed in her stool.  At the second ERCP (Endoscopicretrogradecholangiopancreatography) she underwent sphincterotomy and clearance of biliary sludge. Cholecystectomy plans put on way back burner due to severe aortic stenosis.     PLAN:     *   EGD  Would be diagnostic given Plavix on board.   Agree with addition of oral Protonix today.     Azucena Freed  03/31/2017, 11:16 AM Pager: [phone]  ________________________________________________________________________  Velora Heckler GI MD note:  I personally examined the patient, reviewed the data and agree with the assessment and plan described above.  She has significant heme + anemia. Colonoscopy last month in [place] found some polyps per patient.  Sounds like a lot of oral bleeding after 22 teeth pulls a few weeks ago.  Will plan on EGD tomorrow to rule out other potential causes of her heme + anemia (ulcer, neoplasm, etc).   Owens Loffler, MD [facility] Pager [phone]

## 2017-03-31 NOTE — Evaluation (Signed)
Occupational Therapy Evaluation Patient Details Name: Sonya Carpenter MRN: [id number] DOB: October 21, 1945 Today's Date: 03/31/2017    History of Present Illness Pt presented to [facility] for planned TAVR but found to have acute blood loss anemia and AKI. TAVR put on hold. TAVR finally performed on 12/4. PMH -  aortic stenosis, copd, chf, htn, dm   Clinical Impression   Pt was struggling to manage self care and home management prior to this admission. She used a RW for mobility and adaptive equipment for LB bathing and dressing. Pt was fearful of attempting showering in her tub and therefore avoided. Pt presents older than her chronological age with generalized weakness and decreased endurance. Pt also dealing with nausea this visit, further impairing her ability to participate maximally. She demonstrates cognition WFL for tasks assessed, but question pt's judgement as she did not seek medical attention as she became more weak and fatigued as her hemoglobin dropped while at home. 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 Pt is experiencing a significant decline in functional status. Recommending ST rehab prior to pt returning home as she does not have adequate social support and managing her IADL would be considerably difficult and her hospitalization has been prolonged. Will follow acutely.    Follow Up Recommendations  SNF    Equipment Recommendations  Tub/shower bench    Recommendations for Other Services       Precautions / Restrictions Precautions Precautions: Fall Restrictions Weight Bearing Restrictions: No      Mobility Bed Mobility               General bed mobility comments: pt up in chair  Transfers Overall transfer level: Needs assistance Equipment used: Rolling walker (2 wheeled) Transfers: Sit to/from Stand Sit to Stand: Min guard         General transfer comment: increased time, min guard for  safety    Balance Overall balance assessment: Needs assistance   Sitting balance-Leahy Scale: Good     Standing balance support: Single extremity supported Standing balance-Leahy Scale: Poor Standing balance comment: UE support and min guard for static standing                           ADL either performed or assessed with clinical judgement   ADL Overall ADL's : Needs assistance/impaired Eating/Feeding: Set up;Sitting Eating/Feeding Details (indicate cue type and reason): assist to open packages Grooming: Wash/dry hands;Min guard;Standing Grooming Details (indicate cue type and reason): only able to tolerate one activity in standing at sink Upper Body Bathing: Minimal assistance;Sitting   Lower Body Bathing: Moderate assistance;Sit to/from stand   Upper Body Dressing : Set up;Sitting   Lower Body Dressing: Moderate assistance;Sit to/from stand   Toilet Transfer: Min guard;RW;Ambulation;BSC   Toileting- Water quality scientist and Hygiene: Minimal assistance;Sit to/from Nurse, children's Details (indicate cue type and reason): educated pt in availability of tub transfer bench Functional mobility during ADLs: Min guard;Rolling walker(slow pace) General ADL Comments: Pt with poor activity tolerance and nausea this visit.      Vision Patient Visual Report: No change from baseline       Perception     Praxis      Pertinent Vitals/Pain Pain Assessment: Faces Faces Pain Scale: Hurts little more Pain Location: incision Pain Descriptors / Indicators: Sore Pain Intervention(s): Monitored during session;Repositioned     Hand Dominance Right  Extremity/Trunk Assessment Upper Extremity Assessment Upper Extremity Assessment: Generalized weakness(arthritic changes in hands)   Lower Extremity Assessment Lower Extremity Assessment: Defer to PT evaluation       Communication Communication Communication: HOH   Cognition Arousal/Alertness:  Awake/alert Behavior During Therapy: WFL for tasks assessed/performed Overall Cognitive Status: Within Functional Limits for tasks assessed                                     General Comments       Exercises     Shoulder Instructions      Home Living Family/patient expects to be discharged to:: Private residence Living Arrangements: Alone Available Help at Discharge: Available PRN/intermittently("different ones") Type of Home: House Home Access: Ramped entrance     Home Layout: One level     Bathroom Shower/Tub: Teacher, early years/pre: Standard     Home Equipment: Environmental consultant - 2 wheels;Cane - single point;Bedside commode;Adaptive equipment Adaptive Equipment: Reacher;Sock aid;Long-handled sponge(walker bag)        Prior Functioning/Environment Level of Independence: Independent with assistive device(s)        Comments: amb with rolling walker, avoided getting into tub due to fear of falling, used AE for LB bathing and dressing        OT Problem List: Decreased strength;Decreased activity tolerance;Impaired balance (sitting and/or standing);Pain      OT Treatment/Interventions: Self-care/ADL training;Energy conservation;DME and/or AE instruction;Therapeutic activities;Patient/family education;Balance training;Therapeutic exercise    OT Goals(Current goals can be found in the care plan section) Acute Rehab OT Goals 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 OT Goal Formulation: With patient Time For Goal Achievement: 04/14/17 Potential to Achieve Goals: Good ADL Goals Pt Will Perform Grooming: with modified independence;standing(3 activities) Pt Will Perform Lower Body Bathing: with modified independence;with adaptive equipment;sit to/from stand Pt Will Perform Lower Body Dressing: with modified independence;with adaptive equipment;sit to/from stand Pt Will Transfer to Toilet: with modified  independence;ambulating;bedside commode(BSC over toilet) Pt Will Perform Toileting - Clothing Manipulation and hygiene: with modified independence;sit to/from stand Pt/caregiver will Perform Home Exercise Program: Increased strength;Both right and left upper extremity;With written HEP provided Additional ADL Goal #1: Pt will gather items necessary for ADL around her room with her RW independently. Additional ADL Goal #2: Pt will generalize energy conservation strategies in ADL and mobility independently.  OT Frequency: Min 3X/week   Barriers to D/C: Decreased caregiver support          Co-evaluation              AM-PAC PT "6 Clicks" Daily Activity     Outcome Measure Help from another person eating meals?: A Little Help from another person taking care of personal grooming?: A Little Help from another person toileting, which includes using toliet, bedpan, or urinal?: A Little Help from another person bathing (including washing, rinsing, drying)?: A Lot Help from another person to put on and taking off regular upper body clothing?: None Help from another person to put on and taking off regular lower body clothing?: A Lot 6 Click Score: 17   End of Session Equipment Utilized During Treatment: Gait belt;Rolling walker  Activity Tolerance: Patient limited by fatigue(nausea) Patient left: in chair;with call bell/phone within reach  OT Visit Diagnosis: Unsteadiness on feet (R26.81);Other abnormalities of gait and mobility (R26.89);Muscle weakness (generalized) (M62.81);Pain  Time: 8483-5075 OT Time Calculation (min): 27 min Charges:  OT General Charges $OT Visit: 1 Visit OT Evaluation $OT Eval Moderate Complexity: 1 Mod OT Treatments $Self Care/Home Management : 8-22 mins G-Codes:     2017/04/07 Nestor Lewandowsky, OTR/L Pager: [phone]  Werner Lean, Haze Boyden 2017/04/07, 10:14 AM

## 2017-04-01 ENCOUNTER — Inpatient Hospital Stay (HOSPITAL_COMMUNITY): Payer: Medicare HMO | Admitting: Anesthesiology

## 2017-04-01 ENCOUNTER — Encounter (HOSPITAL_COMMUNITY)
Admission: RE | Disposition: A | Discharge status: Skilled Nursing Facility | Payer: Self-pay | Source: Ambulatory Visit | Attending: Cardiovascular Disease

## 2017-04-01 ENCOUNTER — Encounter (HOSPITAL_COMMUNITY): Payer: Self-pay | Admitting: *Deleted

## 2017-04-01 DIAGNOSIS — K2970 Gastritis, unspecified, without bleeding: Secondary | ICD-10-CM

## 2017-04-01 DIAGNOSIS — K922 Gastrointestinal hemorrhage, unspecified: Secondary | ICD-10-CM

## 2017-04-01 DIAGNOSIS — K2990 Gastroduodenitis, unspecified, without bleeding: Secondary | ICD-10-CM

## 2017-04-01 DIAGNOSIS — K449 Diaphragmatic hernia without obstruction or gangrene: Secondary | ICD-10-CM

## 2017-04-01 HISTORY — PX: ESOPHAGOGASTRODUODENOSCOPY: SHX5428

## 2017-04-01 LAB — BASIC METABOLIC PANEL
ANION GAP: 7 (ref 5–15)
BUN: 82 mg/dL — ABNORMAL HIGH (ref 6–20)
CALCIUM: 9.3 mg/dL (ref 8.9–10.3)
CHLORIDE: 108 mmol/L (ref 101–111)
CO2: 23 mmol/L (ref 22–32)
Creatinine, Ser: 2.35 mg/dL — ABNORMAL HIGH (ref 0.44–1.00)
GFR calc non Af Amer: 20 mL/min — ABNORMAL LOW (ref >60)
GFR, EST AFRICAN AMERICAN: 23 mL/min — AB (ref >60)
GLUCOSE: 60 mg/dL — AB (ref 65–99)
POTASSIUM: 4.2 mmol/L (ref 3.5–5.1)
Sodium: 138 mmol/L (ref 135–145)

## 2017-04-01 LAB — CBC
HEMATOCRIT: 27 % — AB (ref 36.0–46.0)
HEMOGLOBIN: 8.7 g/dL — AB (ref 12.0–15.0)
MCH: 29.2 pg (ref 26.0–34.0)
MCHC: 32.2 g/dL (ref 30.0–36.0)
MCV: 90.6 fL (ref 78.0–100.0)
Platelets: 111 10*3/uL — ABNORMAL LOW (ref 150–400)
RBC: 2.98 MIL/uL — AB (ref 3.87–5.11)
RDW: 15.8 % — ABNORMAL HIGH (ref 11.5–15.5)
WBC: 3.9 10*3/uL — ABNORMAL LOW (ref 4.0–10.5)

## 2017-04-01 LAB — GLUCOSE, CAPILLARY
GLUCOSE-CAPILLARY: 150 mg/dL — AB (ref 65–99)
Glucose-Capillary: 71 mg/dL (ref 65–99)
Glucose-Capillary: 77 mg/dL (ref 65–99)
Glucose-Capillary: 220 mg/dL — ABNORMAL HIGH (ref 65–99)

## 2017-04-01 SURGERY — EGD (ESOPHAGOGASTRODUODENOSCOPY)
Anesthesia: Monitor Anesthesia Care

## 2017-04-01 MED ORDER — PROPOFOL 500 MG/50ML IV EMUL
INTRAVENOUS | Status: DC | PRN
  Administered 2017-04-01: 100 ug/kg/min via INTRAVENOUS

## 2017-04-01 MED ORDER — PANTOPRAZOLE SODIUM 40 MG PO TBEC
40.0000 mg | DELAYED_RELEASE_TABLET | Freq: Two times a day (BID) | ORAL | Status: DC
  Administered 2017-04-01 – 2017-04-02 (×2): 40 mg via ORAL
  Filled 2017-04-01 (×2): qty 1

## 2017-04-01 MED ORDER — PROPOFOL 10 MG/ML IV BOLUS
INTRAVENOUS | Status: DC | PRN
  Administered 2017-04-01: 10 mg via INTRAVENOUS
  Administered 2017-04-01 (×3): 20 mg via INTRAVENOUS

## 2017-04-01 MED ORDER — SUCRALFATE 1 GM/10ML PO SUSP
1.0000 g | Freq: Three times a day (TID) | ORAL | Status: DC
  Administered 2017-04-01 – 2017-04-02 (×2): 1 g via ORAL
  Filled 2017-04-01 (×2): qty 10

## 2017-04-01 NOTE — Anesthesia Preprocedure Evaluation (Addendum)
Anesthesia Evaluation  Patient identified by MRN, date of birth, ID band Patient awake    Reviewed: Allergy & Precautions, H&P , NPO status , Patient's Chart, lab work & pertinent test results  History of Anesthesia Complications Negative for: history of anesthetic complications  Airway Mallampati: II  TM Distance: >3 FB Neck ROM: Full    Dental  (+) Edentulous Upper, Edentulous Lower   Pulmonary asthma , COPD,  COPD inhaler,    breath sounds clear to auscultation       Cardiovascular Exercise Tolerance: Good hypertension, Pt. on medications + CAD and +CHF  + Valvular Problems/Murmurs AS  Rhythm:Regular Rate:Normal - Systolic murmurs ECG: NSR, rate 77  ECHO: Left ventricle: The cavity size was normal. Wall thickness was normal. Systolic function was normal. The estimated ejection fraction was in the range of 60% to 65%. Wall motion was normal; there were no regional wall motion abnormalities. Doppler parameters are consistent with abnormal left ventricular relaxation (grade 1 diastolic dysfunction).   Neuro/Psych negative neurological ROS  negative psych ROS   GI/Hepatic negative GI ROS, Neg liver ROS,   Endo/Other  diabetes, Type 2  Renal/GU Renal Insufficiency and CRFRenal disease     Musculoskeletal  (+) Arthritis ,   Abdominal   Peds  Hematology  (+) anemia ,   Anesthesia Other Findings Anemia.  FOBT positive stool HLD Thrombocytopenia  Reproductive/Obstetrics negative OB ROS                             Anesthesia Physical  Anesthesia Plan  ASA: III  Anesthesia Plan: MAC   Post-op Pain Management:    Induction: Intravenous  PONV Risk Score and Plan: 2 and Propofol infusion  Airway Management Planned: Natural Airway  Additional Equipment:   Intra-op Plan:   Post-operative Plan:   Informed Consent: I have reviewed the patients History and Physical, chart, labs and  discussed the procedure including the risks, benefits and alternatives for the proposed anesthesia with the patient or authorized representative who has indicated his/her understanding and acceptance.   Dental advisory given  Plan Discussed with: CRNA  Anesthesia Plan Comments:         Anesthesia Quick Evaluation

## 2017-04-01 NOTE — Interval H&P Note (Signed)
History and Physical Interval Note:  04/01/2017 10:01 AM  Sonya Carpenter  has presented today for surgery, with the diagnosis of Anemia.  FOBT positive stool.  The various methods of treatment have been discussed with the patient and family. After consideration of risks, benefits and other options for treatment, the patient has consented to  Procedure(s): ESOPHAGOGASTRODUODENOSCOPY (EGD) (N/A) as a surgical intervention .  The patient's history has been reviewed, patient examined, no change in status, stable for surgery.  I have reviewed the patient's chart and labs.  Questions were answered to the patient's satisfaction.     Milus Banister

## 2017-04-01 NOTE — Anesthesia Postprocedure Evaluation (Signed)
Anesthesia Post Note  Patient: [name]  Procedure(s) Performed: ESOPHAGOGASTRODUODENOSCOPY (EGD) (N/A )     Patient location during evaluation: PACU Anesthesia Type: MAC Level of consciousness: awake and alert Pain management: pain level controlled Vital Signs Assessment: post-procedure vital signs reviewed and stable Respiratory status: spontaneous breathing, nonlabored ventilation, respiratory function stable and patient connected to nasal cannula oxygen Cardiovascular status: stable and blood pressure returned to baseline Postop Assessment: no apparent nausea or vomiting Anesthetic complications: no    Last Vitals:  Vitals:   04/01/17 1149 04/01/17 1348  BP:  (!) 134/55  Pulse:  79  Resp:  18  Temp:  37.1 C  SpO2: 97%     Last Pain:  Vitals:   04/01/17 1348  TempSrc: Oral  PainSc:                  Ryan P Ellender

## 2017-04-01 NOTE — Progress Notes (Signed)
      [address]       [place] [address]             [phone]     CARDIOTHORACIC SURGERY PROGRESS NOTE  Day of Surgery  S/P Procedure(s) (LRB): ESOPHAGOGASTRODUODENOSCOPY (EGD) (N/A)  Subjective: Complains that stomach doesn't feel well.  No SOB.  No further BM's overnight.  Objective: Vital signs in last 24 hours: Temp:  [97.8 F (36.6 C)-98.4 F (36.9 C)] 98 F (36.7 C) (12/07 1048) Pulse Rate:  [72-105] 77 (12/07 1100) Cardiac Rhythm: Normal sinus rhythm (12/07 0700) Resp:  [11-20] 17 (12/07 1100) BP: (113-168)/(38-57) 151/57 (12/07 1100) SpO2:  [97 %-100 %] 97 % (12/07 1149) Weight:  [104 lb (47.2 kg)-104 lb 1.6 oz (47.2 kg)] 104 lb (47.2 kg) (12/07 0915)  Physical Exam:  Rhythm:   sinus  Breath sounds: clear  Heart sounds:  RRR  Incisions:  Both groins okay  Abdomen:  Soft, non-distended, non-tender  Extremities:  Warm, well-perfused    Intake/Output from previous day: No intake/output data recorded. Intake/Output this shift: Total I/O In: 271.9 [I.V.:271.9] Out: -   Lab Results: Recent Labs    03/31/17 0358 04/01/17 0151  WBC 5.9 3.9*  HGB 9.7* 8.7*  HCT 29.8* 27.0*  PLT 118* 111*   BMET:  Recent Labs    03/31/17 0358 04/01/17 0151  NA 139 138  K 3.9 4.2  CL 109 108  CO2 22 23  GLUCOSE 65 60*  BUN 81* 82*  CREATININE 2.18* 2.35*  CALCIUM 9.0 9.3    CBG (last 3)  Recent Labs    03/31/17 2105 04/01/17 0647 04/01/17 1156  GLUCAP 151* 71 77   PT/INR:   Recent Labs    03/29/17 1746  LABPROT 16.2*  INR 1.31    CXR:  N/A  Assessment/Plan: S/P Procedure(s) (LRB): ESOPHAGOGASTRODUODENOSCOPY (EGD) (N/A)  Results of EGD notable for active gastritis +/- AVMs w/ ongoing blood loss.  Appreciate Dr Ardis Hughs' assistance.  Will hold ASA and Plavix for now but plan to resume ECASA at D/C if possible due to recent TAVR.  Hold D/C for now.  Recheck Hgb/Hct tomorrow.  Await results of biopsies for H. pylori   I spent  in excess of 15 minutes during the conduct of this hospital encounter and >50% of this time involved direct face-to-face encounter with the patient for counseling and/or coordination of their care.    Rexene Alberts, MD 04/01/2017 12:27 PM

## 2017-04-01 NOTE — Progress Notes (Signed)
Inpatient Diabetes Program Recommendations  AACE/ADA: New Consensus Statement on Inpatient Glycemic Control (2015)  Target Ranges:  Prepandial:   less than 140 mg/dL      Peak postprandial:   less than 180 mg/dL (1-2 hours)      Critically ill patients:  140 - 180 mg/dL   Lab Results  Component Value Date   GLUCAP 77 04/01/2017   HGBA1C 7.4 (H) 03/18/2017    Review of Glycemic Control  Hypoglycemia this am. Needs Lantus reduction.   Inpatient Diabetes Program Recommendations:    Decrease Lantus to 8 units QD  Continue to follow.  Thank you. Lorenda Peck, RD, LDN, CDE Inpatient Diabetes Coordinator [phone]

## 2017-04-01 NOTE — Progress Notes (Signed)
[id number] Pt just worked with OT. Gave pt diabetic and low sodium diets. Pt stated she is also limited by her teeth being out, renal disease, and diverticulosis. Pt stated she has a family member that is a Therapist, sports and going to help her with diet. Discussed CRP 2 but pt declined due to weakness and she stated she might be on dialysis soon and that would be three times a week. Did not give ex ed as pt limited in mobility. Graylon Good RN BSN 04/01/2017 2:53 PM

## 2017-04-01 NOTE — Discharge Summary (Signed)
Physician Discharge Summary  Patient ID: Sonya Carpenter MRN: [id number] DOB/AGE: [birth year] 71 y.o.  Admit date: 03/22/2017 Discharge date: 04/02/2017  Admission Diagnoses: Patient Active Problem List   Diagnosis Date Noted  Severe Aortic stenosis  Discharge Diagnoses:  Principal Problem:   S/P TAVR (transcatheter aortic valve replacement) Active Problems:   Severe aortic stenosis   CKD (chronic kidney disease) stage 4, GFR 15-29 ml/min (HCC)   Hypertension   Type 2 diabetes mellitus without complication, with long-term current use of insulin (HCC)   Anemia, iron deficiency   Acute blood loss anemia   Mitral stenosis   Acute on chronic diastolic heart failure (HCC)   Pressure injury of skin   Gastritis and gastroduodenitis   Discharged Condition: good  HPI:  Patient is a 71 year old female with history of aortic stenosis, hypertension, insulin-dependent type 2 diabetes mellitus with multiple complications, stage IV chronic kidney disease, asthma, congestive heart failure,and at least moderate COPD with long-standing history of secondhand smoke exposure who has been referred for surgical consultation to discuss treatment options for management of severe aortic stenosis. Patient states that she has known of the presence of a heart murmur for several years. She was seen approximately 3 years ago by Dr. Rosalita Chessman and echocardiogram reportedly revealed findings consistent with moderate aortic stenosis. Patient was lost to follow-up for a period of time primarily because her husband was in poor health. The patient was her husband's primary caretaker and remained reasonably active and functionally independent until several months ago when she was hospitalized in [place] for what was initially felt to be pneumonia. During that hospitalization she was thought to have a possible abdominal mass with biliary ductal dilitation. She was referred to [facility] in  [place] where she was diagnosed withcholedochocholelithiasisby ERCP.She did not have an abdominal mass but she was noted to have some gallstones with intermittent episodes of right upper quadrant abdominal discomfort brought on with large meals. Elective cholecystectomy was planned but preoperative cardiac clearance requested due to the presence of her aortic stenosis. She was subsequently readmitted to the hospital in [place] in September with shortness of breath associated with large right pleural effusion requiring thoracentesis. She was treated for possible pneumonia. Transthoracic echocardiogram revealed severe aortic stenosis with peak and mean transvalvular gradients reported 121 and 69 mmHg, respectively.Left ventricular systolic function remains normal with ejection fraction estimated 65%. She was referred to Dr. Burt Knack for consultation and underwent left and right heart catheterization by Dr. Angelena Form on February 09, 2017. 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 Peak to peak and mean transvalvular gradients across the aortic valve were measured 35 and 18.2 mmHg corresponding to aortic valve area calculated 1.26 cm squared.Pulmonary function tests and CT angiography was performed and the patient was referred for elective surgical consultation.  Patient is recently widowed and lives alone in [place]. 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 She does not exercise on a regular basis and she reports living a somewhat sedentary lifestyle, but she reports no significant physical limitations. She does complain of progressive symptoms of exertional shortness of breath. Symptoms of exertional shortness of breath date back many years. They have gotten progressively worse over the last 4-5 months. She  currently gets short of breath with moderate low level activity and was recently hospitalized with resting shortness of breath and orthopnea. She has had  some lower extremity edema. Symptoms have been somewhat improved on medical therapy. She has not had chest pain or chest tightness either with activity or at rest. She has not had dizzy spells or syncope. Patient has never been a smoker but her husband was a heavy smoker. She reports that she does have very poor dentition with several loose teeth. She has not seen a dentist in quite some time.   Hospital Course: Ms. Barillas was admitted for TAVR, however had we had to cancel the first time due to AKI and acute anemia. She was kept in patient for close monitoring. She underwent successful TAVR on 03/29/2017 with a 33mm Edwards Sapien 3 THV via femoral cut down. She was transferred to the ICU in stable condition for continued care. POD 1 her groin sites remained stable. We removed her cental line and arterial line. She was volume overloaded from acute on chronic diastolic CHF and required IV Lasix. She was stable to transfer to the telemetry floor. We placed a consult for social work for SNF placement due to the patient being very weak and frail without social support at home. POD 3 she continued to do well. We monitored her hemoglobin closely. She was found to have heme positive stool and anemia. Therefore, GI was consulted for assistance. She underwent an upper GI endoscopy which showed non-specific distal gastritis. There was oozing in the proximal stomach in the region of a small hiatal hernia. She was treated with cautery. Awaiting biopsy results. For now she was treated with a PPI and carafate. We are holding Plavix and ASA per GI's recommendations.  We transfused as needed. She was seen my GI on 12/8 and her hemoglobin continued to rise, therefore she was deemed appropriate for discharge. Her groin sites were without hematoma, she was off oxygen  support, and she was walking with assistance. She will be discharged to a SNF.     Consults: cardiology  Significant Diagnostic Studies:   Echocardiogram 03/30/2017 Study Conclusions  - Left ventricle: The cavity size was normal. Wall thickness was   normal. Systolic function was normal. The estimated ejection   fraction was in the range of 60% to 65%. Wall motion was normal;   there were no regional wall motion abnormalities. Doppler   parameters are consistent with abnormal left ventricular   relaxation (grade 1 diastolic dysfunction). - Aortic valve: A bioprosthesis was present. Valve area (VTI): 1.23   cm^2. Valve area (Vmax): 1.07 cm^2. Valve area (Vmean): 1.2 cm^2. - Mitral valve: Mildly calcified annulus. Moderately thickened,   moderately calcified leaflets . The findings are consistent with   severe stenosis. Valve area by pressure half-time: 2.14 cm^2.   Valve area by continuity equation (using LVOT flow): 1.03 cm^2. - Left atrium: The atrium was severely dilated. - Tricuspid valve: There was mild-moderate regurgitation.  Treatments:  CARDIOTHORACIC SURGERY OPERATIVE NOTE  Date of Procedure:                03/29/2017  Preoperative Diagnosis:      Severe Aortic Stenosis   Postoperative Diagnosis:    Same   Procedure:        Transcatheter Aortic Valve Replacement - Open Right Transfemoral Approach             Edwards Sapien 3 Transcatheter Heart Valve (size 23 mm, model # 9600TFX, serial # [id number])              Co-Surgeons:  Valentina Gu. Roxy Manns, MD and [name], MD  Anesthesiologist:                  Belinda Block, MD  Echocardiographer:              Loralie Champagne, MD  Pre-operative Echo Findings: ? Severe aortic stenosis ? normal left ventricular systolic function ? Mild mitral stenosis  Post-operative Echo Findings: ? Trivial paravalvular leak ? Normal left ventricular systolic function    Discharge Exam: Blood  pressure (!) 120/48, pulse 79, temperature (!) 97.5 F (36.4 C), temperature source Oral, resp. rate 12, height 4\' 10"  (1.473 m), weight 103 lb 9.6 oz (47 kg), SpO2 97 %.   General appearance: alert, cooperative and no distress Heart: regular rate and rhythm, S1, S2 normal, no murmur, click, rub or gallop Lungs: clear to auscultation bilaterally Abdomen: soft, non-tender; bowel sounds normal; no masses,  no organomegaly Extremities: extremities normal, atraumatic, no cyanosis or edema Wound: groins are without hematoma. Some slight brusing. Both sites are soft, right site tender.     Disposition: 01-Home or Self Care   Allergies as of 04/02/2017   No Known Allergies     Medication List    STOP taking these medications   amLODipine 10 MG tablet Commonly known as:  NORVASC   aspirin EC 81 MG tablet   HYDROcodone-acetaminophen 5-325 MG tablet Commonly known as:  NORCO/VICODIN   LANTUS SOLOSTAR 100 UNIT/ML Solostar Pen Generic drug:  Insulin Glargine Replaced by:  insulin glargine 100 UNIT/ML injection   protein supplement shake Liqd Commonly known as:  PREMIER PROTEIN     TAKE these medications   acetaminophen 500 MG tablet Commonly known as:  TYLENOL Take 500 mg by mouth every 6 (six) hours as needed for mild pain or moderate pain.   albuterol 108 (90 Base) MCG/ACT inhaler Commonly known as:  PROVENTIL HFA;VENTOLIN HFA Inhale 2 puffs every 6 (six) hours as needed into the lungs for wheezing or shortness of breath. What changed:  Another medication with the same name was removed. Continue taking this medication, and follow the directions you see here.   AMITIZA 8 MCG capsule Generic drug:  lubiprostone Take 8 mcg 2 (two) times daily as needed by mouth for constipation.   ARTIFICIAL TEARS 0.1-0.3 % Soln Generic drug:  Dextran 70-Hypromellose Place 2 drops as needed into both eyes (for dry eyes).   atorvastatin 20 MG tablet Commonly known as:  LIPITOR Take 20 mg by  mouth daily.   budesonide-formoterol 160-4.5 MCG/ACT inhaler Commonly known as:  SYMBICORT Inhale 2 puffs 2 (two) times daily as needed into the lungs (for shortness of breath).   cetirizine 10 MG tablet Commonly known as:  ZYRTEC Take 10 mg at bedtime by mouth.   chlorhexidine 0.12 % solution Commonly known as:  PERIDEX Use as directed 15 mLs in the mouth or throat 2 (two) times daily.   CLEAR EYES OP Place 2 drops 2 (two) times daily into both eyes.   docusate sodium 100 MG capsule Commonly known as:  COLACE Take 100 mg 2 (two) times daily as needed by mouth for mild constipation.   feeding supplement (ENSURE ENLIVE) Liqd Take 237 mLs by mouth 2 (two) times daily between meals.   ferrous sulfate 325 (65 FE) MG EC tablet Take 325 mg by mouth 2 (two) times daily.   insulin aspart 100 UNIT/ML injection Commonly known as:  novoLOG Inject 0-24 Units into the skin every 4 (four) hours.  insulin glargine 100 UNIT/ML injection Commonly known as:  LANTUS Inject 0.08 mLs (8 Units total) into the skin daily. Replaces:  LANTUS SOLOSTAR 100 UNIT/ML Solostar Pen   ipratropium 0.02 % nebulizer solution Commonly known as:  ATROVENT Take 0.5 mg by nebulization 2 (two) times daily.   montelukast 10 MG tablet Commonly known as:  SINGULAIR Take 10 mg by mouth at bedtime.   multivitamin with minerals tablet Take 1 tablet by mouth daily.   pantoprazole 40 MG tablet Commonly known as:  PROTONIX Take 1 tablet (40 mg total) by mouth 2 (two) times daily before a meal.   sodium chloride 0.65 % Soln nasal spray Commonly known as:  OCEAN Place 2 sprays 2 (two) times daily as needed into both nostrils for congestion.   sucralfate 1 GM/10ML suspension Commonly known as:  CARAFATE Take 10 mLs (1 g total) by mouth 3 (three) times daily with meals.   traMADol 50 MG tablet Commonly known as:  ULTRAM Take 1 tablet (50 mg total) by mouth every 12 (twelve) hours as needed for moderate  pain.       Contact information for follow-up providers    Eileen Stanford, PA-C. Go on 04/07/2017.   Specialties:  Cardiology, Radiology Why:  @1 :30pm Contact information: [place] [address] [phone]        Michell Heinrich, [place]. Call in 1 day(s).   Specialty:  Family Medicine Contact information: [place] [address] [phone]            Contact information for after-discharge care    [facility] Follow up.   Service:  Skilled Nursing Contact information: [place] [phone]                  Signed: Elgie Collard 04/02/2017, 8:58 AM

## 2017-04-01 NOTE — Care Management Important Message (Signed)
Important Message  Patient Details  Name: Sonya Carpenter MRN: [id number] Date of Birth: [birth year]   Medicare Important Message Given:  Yes    Dawayne Patricia, RN 04/01/2017, 4:33 PM

## 2017-04-01 NOTE — Progress Notes (Signed)
OT Cancellation Note  Patient Details Name: Samary Shatz MRN: [id number] DOB: [birth year]   Cancelled Treatment:    Reason Eval/Treat Not Completed: Patient at procedure or test/ unavailable(Pt in endo.) Will follow.  Malka So 04/01/2017, 10:04 AM  04/01/2017 Nestor Lewandowsky, OTR/L Pager: [phone]

## 2017-04-01 NOTE — Progress Notes (Signed)
Occupational Therapy Treatment Patient Details Name: Sonya Carpenter MRN: [id number] DOB: November 04, 1945 Today's Date: 04/01/2017    History of present illness Pt presented to [facility] for planned TAVR but found to have acute blood loss anemia and AKI. TAVR put on hold. TAVR finally performed on 12/4. PMH -  aortic stenosis, copd, chf, htn, dm   OT comments  Pt with improving endurance for standing ADL at sink and ambulation while gathering ADL items with RW.   Follow Up Recommendations  SNF    Equipment Recommendations  Tub/shower bench    Recommendations for Other Services      Precautions / Restrictions Precautions Precautions: Fall       Mobility Bed Mobility Overal bed mobility: Needs Assistance Bed Mobility: Supine to Sit     Supine to sit: Supervision     General bed mobility comments: increased time, inefficient movement, reports incisional pain  Transfers Overall transfer level: Needs assistance Equipment used: Rolling walker (2 wheeled) Transfers: Sit to/from Stand Sit to Stand: Min guard         General transfer comment: increased time, min guard for safety    Balance Overall balance assessment: Needs assistance   Sitting balance-Leahy Scale: Good       Standing balance-Leahy Scale: Poor Standing balance comment: min guard at sink                           ADL either performed or assessed with clinical judgement   ADL Overall ADL's : Needs assistance/impaired     Grooming: Wash/dry face;Brushing hair;Standing;Minimal assistance Grooming Details (indicate cue type and reason): tolerated 2 standing activities today         Upper Body Dressing : Set up;Sitting   Lower Body Dressing: Minimal assistance;Sit to/from stand   Toilet Transfer: Min guard;RW;Ambulation;BSC   Toileting- Water quality scientist and Hygiene: Minimal assistance;Sit to/from stand Toileting - Clothing Manipulation Details (indicate cue type and reason): for gown      Functional mobility during ADLs: Min guard;Rolling walker General ADL Comments: Pt with improving activity tolerance.     Vision       Perception     Praxis      Cognition Arousal/Alertness: Awake/alert Behavior During Therapy: WFL for tasks assessed/performed Overall Cognitive Status: Within Functional Limits for tasks assessed                                          Exercises     Shoulder Instructions       General Comments      Pertinent Vitals/ Pain       Pain Assessment: Faces Faces Pain Scale: Hurts little more Pain Location: incision Pain Descriptors / Indicators: Sore Pain Intervention(s): Monitored during session;Repositioned  Home Living                                          Prior Functioning/Environment              Frequency  Min 3X/week        Progress Toward Goals  OT Goals(current goals can now be found in the care plan section)  Progress towards OT goals: Progressing toward goals  Acute Rehab OT Goals 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 OT Goal Formulation: With patient Time For Goal Achievement: 04/14/17 Potential to Achieve Goals: Good  Plan Discharge plan remains appropriate    Co-evaluation                 AM-PAC PT "6 Clicks" Daily Activity     Outcome Measure   Help from another person eating meals?: A Little Help from another person taking care of personal grooming?: A Little Help from another person toileting, which includes using toliet, bedpan, or urinal?: A Little Help from another person bathing (including washing, rinsing, drying)?: A Little Help from another person to put on and taking off regular upper body clothing?: A Little Help from another person to put on and taking off regular lower body clothing?: A Little 6 Click Score: 18    End of Session Equipment Utilized During Treatment: Gait belt;Rolling walker  OT Visit  Diagnosis: Unsteadiness on feet (R26.81);Other abnormalities of gait and mobility (R26.89);Muscle weakness (generalized) (M62.81);Pain   Activity Tolerance Patient tolerated treatment well   Patient Left in chair;with call bell/phone within reach   Nurse Communication Mobility status(aware pt did not get lunch tray)        Time: 1350-1411 OT Time Calculation (min): 21 min  Charges: OT General Charges $OT Visit: 1 Visit OT Treatments $Self Care/Home Management : 8-22 mins  04/01/2017 Nestor Lewandowsky, OTR/L Pager: [phone]   Werner Lean, Haze Boyden 04/01/2017, 2:18 PM

## 2017-04-01 NOTE — Transfer of Care (Signed)
Immediate Anesthesia Transfer of Care Note  Patient: Sonya Carpenter  Procedure(s) Performed: ESOPHAGOGASTRODUODENOSCOPY (EGD) (N/A )  Patient Location: Endoscopy Unit  Anesthesia Type:MAC  Level of Consciousness: awake, oriented and patient cooperative  Airway & Oxygen Therapy: Patient Spontanous Breathing and Patient connected to nasal cannula oxygen  Post-op Assessment: Report given to RN, Post -op Vital signs reviewed and stable and Patient moving all extremities  Post vital signs: Reviewed and stable  Last Vitals:  Vitals:   04/01/17 0423 04/01/17 0915  BP: (!) 128/38 (!) 168/43  Pulse: 72   Resp: 11 13  Temp: 36.6 C 36.7 C  SpO2: 100% 99%    Last Pain:  Vitals:   04/01/17 0915  TempSrc: Oral  PainSc:          Complications: No apparent anesthesia complications

## 2017-04-01 NOTE — Op Note (Signed)
[name] Procedure Date : 04/01/2017 MRN: [id number] Attending MD: Milus Banister , MD Date of Birth: 1946/04/09 CSN: [id number] Age: 70 Admit Type: Inpatient Procedure:                Upper GI endoscopy Indications:              Heme positive stool, anemia Providers:                Milus Banister, MD, Cleda Daub, RN, Cherylynn Ridges, Technician, Claybon Jabs CRNA, CRNA Referring MD:              Medicines:                Monitored Anesthesia Care Complications:            No immediate complications. Estimated blood loss:                            None. Estimated Blood Loss:     Estimated blood loss: none. Procedure:                Pre-Anesthesia Assessment:                           - Prior to the procedure, a History and Physical                            was performed, and patient medications and                            allergies were reviewed. The patient's tolerance of                            previous anesthesia was also reviewed. The risks                            and benefits of the procedure and the sedation                            options and risks were discussed with the patient.                            All questions were answered, and informed consent                            was obtained. Prior Anticoagulants: The patient has                            taken Plavix (clopidogrel), last dose was 1 day                            prior to procedure. ASA Grade Assessment: III - A  patient with severe systemic disease. After                            reviewing the risks and benefits, the patient was                            deemed in satisfactory condition to undergo the                            procedure.                           After obtaining informed consent, the endoscope was                            passed under direct vision. Throughout the                           procedure, the patient's blood pressure, pulse, and                            oxygen saturations were monitored continuously. The                            EG-[id number] ([id number]) scope was introduced through the                            mouth, and advanced to the second part of duodenum.                            The upper GI endoscopy was accomplished without                            difficulty. The patient tolerated the procedure                            well. Scope In: Scope Out: Findings:      The esophagus was normal.      There was mild, non-specific inflammation characterized by erythema and       friability was found in the gastric antrum. Biopsies were taken with a       cold forceps for histology.      There was a small hiatal hernia. In the region of the hernia, the mucosa       was focally friable and oozing slowly (about 1cm across). I flushed this       and the oozing continued very slowly. I could not tell if this       represents AVMs or perhaps Cameron's type erosive disease. I elected to       treat the area with APC cautery. Afterwards there was still minor oozing       but less so. I felt further APC would be counterproductive.      The examined duodenum was normal. Impression:               - Non-specific distal gastritis. Biopsied to check  for H. pylori.                           - Focal gastritis, oozing in proximal stomach in                            region of a small hiatal hernia. AVM vs. Cameron's                            type erosions. Treated with APC cautery (see above). Moderate Sedation:      none Recommendation:           - Return patient to hospital ward for ongoing care.                           - Advance diet as tolerated.                           - If biopsies from stomach show H. pylori, will                            start appropriate antibiotics.                           - Will  double her PPI to twice daily and will add                            carafate tid as well.                           - Holding her ASA +/- plavix would certainly help                            her GI bleed situation. I will leave that to                            primary team, CT Surgery to decide given her recent                            TAVR.                           - Follow blood counts and transfuse as needed for                            now.                           - Will follow along. Procedure Code(s):        --- Professional ---                           [phone], Esophagogastroduodenoscopy, flexible,                            transoral; with biopsy,  single or multiple Diagnosis Code(s):        --- Professional ---                           K29.70, Gastritis, unspecified, without bleeding                           R19.5, Other fecal abnormalities CPT copyright 2016 American Medical Association. All rights reserved. The codes documented in this report are preliminary and upon coder review may  be revised to meet current compliance requirements. Milus Banister, MD 04/01/2017 10:55:56 AM This report has been signed electronically. Number of Addenda: 0

## 2017-04-02 DIAGNOSIS — Z952 Presence of prosthetic heart valve: Secondary | ICD-10-CM

## 2017-04-02 LAB — CBC
HCT: 29.7 % — ABNORMAL LOW (ref 36.0–46.0)
HEMOGLOBIN: 9.4 g/dL — AB (ref 12.0–15.0)
MCH: 28.7 pg (ref 26.0–34.0)
MCHC: 31.6 g/dL (ref 30.0–36.0)
MCV: 90.5 fL (ref 78.0–100.0)
Platelets: 111 10*3/uL — ABNORMAL LOW (ref 150–400)
RBC: 3.28 MIL/uL — AB (ref 3.87–5.11)
RDW: 15.3 % (ref 11.5–15.5)
WBC: 3.6 10*3/uL — ABNORMAL LOW (ref 4.0–10.5)

## 2017-04-02 LAB — GLUCOSE, CAPILLARY
GLUCOSE-CAPILLARY: 26 mg/dL — AB (ref 65–99)
GLUCOSE-CAPILLARY: 26 mg/dL — AB (ref 65–99)
GLUCOSE-CAPILLARY: 219 mg/dL — AB (ref 65–99)
Glucose-Capillary: 174 mg/dL — ABNORMAL HIGH (ref 65–99)
Glucose-Capillary: 149 mg/dL — ABNORMAL HIGH (ref 65–99)
Glucose-Capillary: 149 mg/dL — ABNORMAL HIGH (ref 65–99)

## 2017-04-02 LAB — BASIC METABOLIC PANEL
ANION GAP: 10 (ref 5–15)
BUN: 68 mg/dL — ABNORMAL HIGH (ref 6–20)
CALCIUM: 9.4 mg/dL (ref 8.9–10.3)
CHLORIDE: 109 mmol/L (ref 101–111)
CO2: 21 mmol/L — AB (ref 22–32)
Creatinine, Ser: 2.27 mg/dL — ABNORMAL HIGH (ref 0.44–1.00)
GFR calc non Af Amer: 21 mL/min — ABNORMAL LOW (ref >60)
GFR, EST AFRICAN AMERICAN: 24 mL/min — AB (ref >60)
Glucose, Bld: 221 mg/dL — ABNORMAL HIGH (ref 65–99)
Potassium: 3.7 mmol/L (ref 3.5–5.1)
Sodium: 140 mmol/L (ref 135–145)

## 2017-04-02 MED ORDER — ENSURE ENLIVE PO LIQD: 237.0000 mL | Freq: Two times a day (BID) | ORAL | 12 refills | Status: DC

## 2017-04-02 MED ORDER — TRAMADOL HCL 50 MG PO TABS: 50.0000 mg | ORAL_TABLET | Freq: Two times a day (BID) | ORAL | 0 refills | Status: DC | PRN

## 2017-04-02 MED ORDER — DEXTROSE 50 % IV SOLN
50.0000 mL | Freq: Once | INTRAVENOUS | Status: AC
  Administered 2017-04-02: 50 mL via INTRAVENOUS

## 2017-04-02 MED ORDER — SUCRALFATE 1 GM/10ML PO SUSP: 1.0000 g | Freq: Three times a day (TID) | ORAL | 0 refills | Status: DC

## 2017-04-02 MED ORDER — INSULIN GLARGINE 100 UNIT/ML ~~LOC~~ SOLN: 8.0000 [IU] | Freq: Every day | SUBCUTANEOUS | 11 refills | Status: DC

## 2017-04-02 MED ORDER — INSULIN ASPART 100 UNIT/ML ~~LOC~~ SOLN: 0.0000 [IU] | SUBCUTANEOUS | 11 refills | Status: DC

## 2017-04-02 MED ORDER — DEXTROSE 50 % IV SOLN
INTRAVENOUS | Status: AC
  Filled 2017-04-02: qty 50

## 2017-04-02 MED ORDER — PANTOPRAZOLE SODIUM 40 MG PO TBEC: 40.0000 mg | DELAYED_RELEASE_TABLET | Freq: Two times a day (BID) | ORAL | 1 refills | Status: DC

## 2017-04-02 MED ORDER — INSULIN GLARGINE 100 UNIT/ML ~~LOC~~ SOLN
8.0000 [IU] | Freq: Every day | SUBCUTANEOUS | Status: DC
  Filled 2017-04-02: qty 0.08

## 2017-04-02 NOTE — Progress Notes (Signed)
      [address]       [place] [address]             [phone]      1 Day Post-Op Procedure(s) (LRB): ESOPHAGOGASTRODUODENOSCOPY (EGD) (N/A) Subjective: Feels good this morning. Ready to be discharged.   Objective: Vital signs in last 24 hours: Temp:  [94 F (34.4 C)-98.7 F (37.1 C)] 97.5 F (36.4 C) (12/08 0619) Pulse Rate:  [77-80] 79 (12/07 1348) Cardiac Rhythm: Normal sinus rhythm (12/08 0700) Resp:  [12-20] 12 (12/08 0619) BP: (120-168)/(43-57) 120/48 (12/08 0400) SpO2:  [97 %-99 %] 97 % (12/07 1149) Weight:  [103 lb 9.6 oz (47 kg)-104 lb (47.2 kg)] 103 lb 9.6 oz (47 kg) (12/08 0400)     Intake/Output from previous day: 12/07 0701 - 12/08 0700 In: 991.9 [P.O.:720; I.V.:271.9] Out: -  Intake/Output this shift: No intake/output data recorded.  General appearance: alert, cooperative and no distress Heart: regular rate and rhythm, S1, S2 normal, no murmur, click, rub or gallop Lungs: clear to auscultation bilaterally Abdomen: soft, non-tender; bowel sounds normal; no masses,  no organomegaly Extremities: extremities normal, atraumatic, no cyanosis or edema Wound: groins are without hematoma. Some slight brusing. Both sites are soft, right site tender.   Lab Results: Recent Labs    04/01/17 0151 04/02/17 0233  WBC 3.9* 3.6*  HGB 8.7* 9.4*  HCT 27.0* 29.7*  PLT 111* 111*   BMET:  Recent Labs    04/01/17 0151 04/02/17 0233  NA 138 140  K 4.2 3.7  CL 108 109  CO2 23 21*  GLUCOSE 60* 221*  BUN 82* 68*  CREATININE 2.35* 2.27*  CALCIUM 9.3 9.4    PT/INR: No results for input(s): LABPROT, INR in the last 72 hours. ABG    Component Value Date/Time   PHART 7.306 (L) 03/29/2017 1759   HCO3 22.5 03/29/2017 1759   TCO2 24 03/29/2017 1759   ACIDBASEDEF 4.0 (H) 03/29/2017 1759   O2SAT 99.0 03/29/2017 1759   CBG (last 3)  Recent Labs    04/02/17 0357 04/02/17 0617 04/02/17 0750  GLUCAP 174* 149* 149*    Assessment/Plan: S/P  Procedure(s) (LRB): ESOPHAGOGASTRODUODENOSCOPY (EGD) (N/A)  1. 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 Holding Plavix and ASA due to GI's recommendations. Groin sites without hematoma.  2. Anemia-EGD yesterday found active gastritis +/- AVMs w/ ongoing blood loss. H and H improved today 9.4/29.7. Will require close follow-up with outpatient GI 3. AKI-creatinine down to 2.27 was 3.02 on admission. Making good urine. Weight is stable.  4. DM type 2-hypoglycemia this morning around 1am. Treated. Lantus decreased per recommendations.   Plan: to SNF today. Follow-up has been arranged. Plan to see Nell Range, PA-C on Wed with a f/u H and H. Will restart ECASA at that time.      LOS: 11 days    Elgie Collard 04/02/2017

## 2017-04-02 NOTE — Progress Notes (Signed)
CARDIAC REHAB PHASE I   10:39a  Patient packed up and ready for transport. Will not walk today.   Lynch, MS 04/02/2017 10:41 AM

## 2017-04-02 NOTE — Clinical Social Work Placement (Signed)
   CLINICAL SOCIAL WORK PLACEMENT  NOTE  Date:  04/02/2017  Patient Details  Name: Sonya Carpenter MRN: [id number] Date of Birth: [birth year]  Clinical Social Work is seeking post-discharge placement for this patient at the [place] level of care (*CSW will initial, date and re-position this form in  chart as items are completed):  Yes   Patient/family provided with [facility] Work Department's list of facilities offering this level of care within the geographic area requested by the patient (or if unable, by the patient's family).  Yes   Patient/family informed of their freedom to choose among providers that offer the needed level of care, that participate in Medicare, Medicaid or managed care program needed by the patient, have an available bed and are willing to accept the patient.  Yes   Patient/family informed of [facility]'s ownership interest in [facility] and [facility], as well as of the fact that they are under no obligation to receive care at these facilities.  PASRR submitted to EDS on       PASRR number received on       Existing PASRR number confirmed on       FL2 transmitted to all facilities in geographic area requested by pt/family on 03/25/17     FL2 transmitted to all facilities within larger geographic area on       Patient informed that his/her managed care company has contracts with or will negotiate with certain facilities, including the following:        Yes   Patient/family informed of bed offers received.  Patient [facility] bed at [facility]     Physician recommends and patient chooses bed at      Patient to be transferred to [facility] on 04/02/17.  Patient to be transferred to facility by [facility]     Patient family notified on 04/02/17 of transfer.  Name of family member notified:  Left voicemail for cousin, Toma Aran.     PHYSICIAN       Additional Comment:     _______________________________________________ Candie Chroman, LCSW 04/02/2017, 9:37 AM

## 2017-04-02 NOTE — Clinical Social Work Note (Signed)
CSW facilitated patient discharge including contacting patient family (left voicemail for cousin, Toma Aran) and facility to confirm patient discharge plans. Clinical information faxed to facility and family agreeable with plan. CSW arranged ambulance transport via [facility] to [facility]. RN to call report prior to discharge [phone] to the [place]).  CSW will sign off for now as social work intervention is no longer needed. Please consult [place] again if new needs arise.  Dayton Scrape, [place]

## 2017-04-02 NOTE — Progress Notes (Signed)
Pt to discharge to [facility] in [place], per order. Pt's cousin called to update her on pt's plan of care. Voicemail left by this RN. IV and telemetry box removed. Attempted report to [facility]; no response. Transportation arranged by CSW. AVS and prescription printed. Awaiting transportation to arrive.   Grant Fontana BSN, RN

## 2017-04-02 NOTE — Progress Notes (Signed)
[facility] Progress Note    Since last GI note: EGD yesterday (oozing AVM vs. Cameron's erosions; treated with APC cautery; also mild gastritis biopsied to check for H. Pylori). Increased to twice daily PPI and started carafate TID with meals. Plavix/ASA held for now.  No overt bleeding  Objective: Vital signs in last 24 hours: Temp:  [94 F (34.4 C)-98.7 F (37.1 C)] 97.5 F (36.4 C) (12/08 0619) Pulse Rate:  [77-80] 79 (12/07 1348) Resp:  [12-20] 12 (12/08 0619) BP: (120-168)/(43-57) 120/48 (12/08 0400) SpO2:  [97 %-99 %] 97 % (12/07 1149) Weight:  [103 lb 9.6 oz (47 kg)-104 lb (47.2 kg)] 103 lb 9.6 oz (47 kg) (12/08 0400) Last BM Date: 03/31/17 General: alert and oriented times 3 Heart: regular rate and rythm Abdomen: soft, non-tender, non-distended, normal bowel sounds   Lab Results: Recent Labs    03/31/17 0358 04/01/17 0151 04/02/17 0233  WBC 5.9 3.9* 3.6*  HGB 9.7* 8.7* 9.4*  PLT 118* 111* 111*  MCV 88.4 90.6 90.5   Recent Labs    03/31/17 0358 04/01/17 0151 04/02/17 0233  NA 139 138 140  K 3.9 4.2 3.7  CL 109 108 109  CO2 22 23 21*  GLUCOSE 65 60* 221*  BUN 81* 82* 68*  CREATININE 2.18* 2.35* 2.27*  CALCIUM 9.0 9.3 9.4     Medications: Scheduled Meds: . acetaminophen  1,000 mg Oral Q6H  . atorvastatin  20 mg Oral Daily  . feeding supplement (ENSURE ENLIVE)  237 mL Oral BID BM  . ferrous sulfate  325 mg Oral BID  . insulin aspart  0-24 Units Subcutaneous Q4H  . insulin glargine  10 Units Subcutaneous Daily  . loratadine  10 mg Oral Daily  . mometasone-formoterol  2 puff Inhalation BID  . montelukast  10 mg Oral QHS  . pantoprazole  40 mg Oral BID AC  . sucralfate  1 g Oral TID WC   Continuous Infusions: . sodium chloride 50 mL/hr at 04/01/17 0915  . sodium chloride     PRN Meds:.docusate sodium, fentaNYL (SUBLIMAZE) injection, lubiprostone, metoprolol tartrate, ondansetron (ZOFRAN) IV, traMADol    Assessment/Plan: 71  y.o. female s/p recent TAVR, UGI bleed  She's received 3 units blood in the past 2 weeks, last was 4 days ago and her Hb is stable. No overt GI bleeding in 1-2 days now.    I am not sure if the findings in her very proximal stomach represent Cameron's erosions (from small [place]) or atypical AVMs.  Either way, APC cautery was applied and mild gastritis biopsied to check for H. Pylori. If that is positive for H. Pylori, I will start her on appropriate antibiotics.  She must return to her NH today or she loses her bed and I think she is safe from GI standpoint. She should stay on PPI twice daily for at least the next 2 months then OK to cut back to once daily. She should stay on carafate TID with meals for the next 2 months, then can stop completely.  I understand she will probably restart her ASA on Wednesday.  She should follow up with her local GI in [place] (within the next 4-6 weeks) or I am happy to see her if she prefers.  Please call or page with any further questions or concerns.   Milus Banister, MD  04/02/2017, 8:04 AM [facility] Pager [phone]

## 2017-04-03 LAB — GLUCOSE, CAPILLARY: Glucose-Capillary: 263 mg/dL — ABNORMAL HIGH (ref 65–99)

## 2017-04-04 ENCOUNTER — Telehealth: Payer: Self-pay | Admitting: Physician Assistant

## 2017-04-04 ENCOUNTER — Encounter (HOSPITAL_COMMUNITY): Payer: Self-pay | Admitting: Gastroenterology

## 2017-04-04 ENCOUNTER — Encounter: Payer: Self-pay | Admitting: Thoracic Surgery (Cardiothoracic Vascular Surgery)

## 2017-04-04 NOTE — Telephone Encounter (Signed)
  [facility]   MULTIDISCIPLINARY HEART VALVE TEAM   Attempted to call the facility but was never able to be connected to the patient or caretaker. I talked to [facility] and went over [facility] information.  Patient's niece Lynelle Smoke was contacted regarding discharge from [facility] on 12/8  Patient understands to follow up with provider me on 12/13 @ 2pm a at [address]  Patient understands discharge instructions? yes Patient understands medications and regiment? yes Patient understands to bring all medications to this visit? yes   Angelena Form PA-C  [facility]

## 2017-04-07 ENCOUNTER — Encounter: Payer: Self-pay | Admitting: Physician Assistant

## 2017-04-07 ENCOUNTER — Ambulatory Visit (INDEPENDENT_AMBULATORY_CARE_PROVIDER_SITE_OTHER): Payer: Medicare HMO | Admitting: Physician Assistant

## 2017-04-07 VITALS — BP 142/60 | HR 82 | Ht <= 58 in | Wt 107.4 lb

## 2017-04-07 DIAGNOSIS — E119 Type 2 diabetes mellitus without complications: Secondary | ICD-10-CM

## 2017-04-07 DIAGNOSIS — D649 Anemia, unspecified: Secondary | ICD-10-CM

## 2017-04-07 DIAGNOSIS — I5032 Chronic diastolic (congestive) heart failure: Secondary | ICD-10-CM | POA: Diagnosis not present

## 2017-04-07 DIAGNOSIS — K922 Gastrointestinal hemorrhage, unspecified: Secondary | ICD-10-CM

## 2017-04-07 DIAGNOSIS — Z794 Long term (current) use of insulin: Secondary | ICD-10-CM

## 2017-04-07 DIAGNOSIS — Z952 Presence of prosthetic heart valve: Secondary | ICD-10-CM

## 2017-04-07 NOTE — Patient Instructions (Addendum)
Medication Instructions:  Your physician recommends that you continue on your current medications as directed. Please refer to the Current Medication list given to you today.   Labwork: TODAY BMET, CBC, PRO BNP  Testing/Procedures: NONE ORDERED TODAY  Follow-Up: 04/14/17 1 PM WITH KATIE THOMPSON, PAC  DR. PATEL 04/08/17 @ 11:45 AM  Any Other Special Instructions Will Be Listed Below (If Applicable).     If you need a refill on your cardiac medications before your next appointment, please call your pharmacy.

## 2017-04-07 NOTE — Progress Notes (Addendum)
[facility]                                       Cardiology Office Note    Date:  04/07/2017   ID:  Kailen Name, DOB [birth year], MRN [id number]  PCP:  Michell Heinrich, DO  Cardiologist: Dr.Zachary (Angelina Sheriff, [place]) / Dr. Burt Knack &Dr. Roxy Manns (TAVR)  CC: TOC follow up s/p TAVR   History of Present Illness:  Soley Harriss is a 71 y.o. 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   Patient states that she has known of the presence of a heart murmur for several years. She was seen approximately 3 years ago by Dr. Rosalita Chessman and echocardiogram reportedly revealed findings consistent with moderate aortic stenosis. Patient was lost to follow-up for a period of time primarily because her husband was in poor health. 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 During that hospitalization she was thought to have a possible abdominal mass with biliary ductal dilitation. She was referred to [facility] in [place] where she was diagnosed withcholedochocholelithiasisby ERCP.She did not have an abdominal mass but she was noted to have some gallstones with intermittent episodes of right upper quadrant abdominal discomfort brought on with large meals. Elective cholecystectomy was planned but preoperative cardiac clearance requested due to the presence of her aortic stenosis. She was subsequently readmitted to the hospital in [place] in September with shortness of breath associated with large right pleural  effusion requiring thoracentesis. She was treated for possible pneumonia. Transthoracic echocardiogram revealed severe aortic stenosis with peak and mean transvalvular gradients reported 121 and 69 mmHg, respectively.Left ventricular systolic function remains normal with ejection fraction estimated 65%. She was referred to Dr. Burt Knack for consultation and underwent left and right heart catheterization by Dr. Angelena Form on February 09, 2017. 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 Peak to peak and mean transvalvular gradients across the aortic valve were measured 35 and 18.2 mmHg corresponding to aortic valve area calculated 1.26 cm squared.Pulmonary function tests and CT angiography was performed and the patient was referred for elective surgical consultation.  Ms. Teodoro was admitted for TAVR on 03/22/17, however we had to cancel the first time due to AKI and acute anemia. She was transfused and hydrated and kept inpatient for close monitoring. She underwent successful TAVR on 03/29/2017 with a 55mm Edwards Sapien 3 THV via femoral cut down. She was volume overloaded from acute on chronic diastolic CHF and required IV Lasix. Her hemoglobin continued to drift down and she was found to have heme positive stool. She underwent an upper GI endoscopy which showed non-specific distal gastritis and oozing AVM vs. Cameron's erosions; treated with APC cautery. She was started on PPI and Carafate and plavix and ASA were held per GI's recommendations. Plan was to DC to SNF with close follow up.   Today she presents to clinic for follow up. She is doing well at [facility] in  [place]. She was diagnosed with a UTI that is now being treated. Apparently, she was dehydrated and they started an IV to give her fluids. Notes and labs were not sent with her today. I personally called the facility and her RN  told me her creat was 2.4 and Hg 9.9. She has been working with PT and getting stronger. Energy is better. No dizziness, syncope or dizziness. No chest pain. Shortness of breath and cough improved. Still has some shortness of breath when laying flat. She did notice bright red blood in her stool yesterday, about 1/4 a cup. No more since that time.    Past Medical History:  Diagnosis Date  . Anemia   . Aortic stenosis   . Arthritis    back  . Asthma   . CAD (coronary artery disease)   . Chronic diastolic congestive heart failure ([place])   . CKD (chronic kidney disease), stage IV ([place])   . COPD (chronic obstructive pulmonary disease) ([place])   . Diabetes mellitus without complication ([place])    type 2  . Gallbladder disease   . Headache   . Hyperlipidemia   . Hypertension   . Mitral stenosis   . Pneumonia   . 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 Vitamin D deficiency     Past Surgical History:  Procedure Laterality Date  . BREAST CYST EXCISION Right   . COLONSCOPY    . ELBOW SURGERY Left   . ESOPHAGOGASTRODUODENOSCOPY N/A 04/01/2017   Procedure: ESOPHAGOGASTRODUODENOSCOPY (EGD);  Surgeon: Milus Banister, MD;  Location: [facility] ENDOSCOPY;  Service: Endoscopy;  Laterality: N/A;  . KNEE SURGERY    . MULTIPLE EXTRACTIONS WITH ALVEOLOPLASTY N/A 03/14/2017   Procedure: Extraction of tooth #'s 2-11, 15, 19-28 and 31 with alveoloplasty, bilateral mandibular tori reductions and bilateral mandibular lingual exostoses reductions.;  Surgeon: Lenn Cal, DDS;  Location: [place];  Service: Oral Surgery;  Laterality: N/A;  . REFRACTIVE SURGERY    . RIGHT/LEFT HEART CATH AND CORONARY ANGIOGRAPHY N/A 02/09/2017   Procedure: RIGHT/LEFT HEART CATH AND CORONARY ANGIOGRAPHY;  Surgeon: Burnell Blanks, MD;  Location: [facility];  Service: Cardiovascular;  Laterality: N/A;   . TEE WITHOUT CARDIOVERSION N/A 03/29/2017   Procedure: TRANSESOPHAGEAL ECHOCARDIOGRAM (TEE);  Surgeon: [name], MD;  Location: [place];  Service: Open Heart Surgery;  Laterality: N/A;  . TONSILLECTOMY    . TRANSCATHETER AORTIC VALVE REPLACEMENT, TRANSFEMORAL N/A 03/29/2017   Procedure: TRANSCATHETER AORTIC VALVE REPLACEMENT, TRANSFEMORAL;  Surgeon: [name], MD;  Location: [place];  Service: Open Heart Surgery;  Laterality: N/A;    Current Medications: Outpatient Medications Prior to Visit  Medication Sig Dispense Refill  . acetaminophen (TYLENOL) 500 MG tablet Take 500 mg by mouth every 6 (six) hours as needed for mild pain or moderate pain.    Marland Kitchen albuterol (PROVENTIL HFA;VENTOLIN HFA) 108 (90 Base) MCG/ACT inhaler Inhale 2 puffs every 6 (six) hours as needed into the lungs for wheezing or shortness of breath.     Marland Kitchen atorvastatin (LIPITOR) 20 MG tablet Take 20 mg by mouth daily.    . budesonide-formoterol (SYMBICORT) 160-4.5 MCG/ACT inhaler Inhale 2 puffs 2 (two) times daily as needed into the lungs (for shortness of breath).     . cefTRIAXone (ROCEPHIN) 1 g injection Inject 1 g into the muscle once.    . cetirizine (ZYRTEC) 10 MG tablet  Take 10 mg at bedtime by mouth.     . chlorhexidine (PERIDEX) 0.12 % solution Use as directed 15 mLs in the mouth or throat 2 (two) times daily. 480 mL prn  . Dextran 70-Hypromellose (ARTIFICIAL TEARS) 0.1-0.3 % SOLN Place 2 drops as needed into both eyes (for dry eyes).     Marland Kitchen docusate sodium (COLACE) 100 MG capsule Take 100 mg 2 (two) times daily as needed by mouth for mild constipation.    . feeding supplement, ENSURE ENLIVE, (ENSURE ENLIVE) LIQD Take 237 mLs by mouth 2 (two) times daily between meals. 237 mL 12  . insulin aspart (NOVOLOG) 100 UNIT/ML injection Inject 0-24 Units into the skin every 4 (four) hours. 10 mL 11  . insulin glargine (LANTUS) 100 UNIT/ML injection Inject 0.08 mLs (8 Units total) into the skin daily. 10 mL 11  . ipratropium  (ATROVENT) 0.02 % nebulizer solution Take 0.5 mg by nebulization 2 (two) times daily.    Marland Kitchen levofloxacin (LEVAQUIN) 500 MG tablet Take 500 mg by mouth daily.    Marland Kitchen lubiprostone (AMITIZA) 8 MCG capsule Take 8 mcg 2 (two) times daily as needed by mouth for constipation.     . montelukast (SINGULAIR) 10 MG tablet Take 10 mg by mouth at bedtime.    . Multiple Vitamins-Minerals (MULTIVITAMIN WITH MINERALS) tablet Take 1 tablet by mouth daily.    . Naphazoline HCl (CLEAR EYES OP) Place 2 drops 2 (two) times daily into both eyes.    . pantoprazole (PROTONIX) 40 MG tablet Take 1 tablet (40 mg total) by mouth 2 (two) times daily before a meal. 60 tablet 1  . phenazopyridine (PYRIDIUM) 200 MG tablet Take 200 mg by mouth 3 (three) times daily.    . sodium chloride (OCEAN) 0.65 % SOLN nasal spray Place 2 sprays 2 (two) times daily as needed into both nostrils for congestion.     . sucralfate (CARAFATE) 1 GM/10ML suspension Take 10 mLs (1 g total) by mouth 3 (three) times daily with meals. 420 mL 0  . traMADol (ULTRAM) 50 MG tablet Take 1 tablet (50 mg total) by mouth every 12 (twelve) hours as needed for moderate pain. 14 tablet 0  . ferrous sulfate 325 (65 FE) MG EC tablet Take 325 mg by mouth 2 (two) times daily.      No facility-administered medications prior to visit.      Allergies:   Patient has no known allergies.   Social History   Socioeconomic History  . Marital status: Widowed    Spouse name: None  . Number of children: 0  . Years of education: None  . Highest education level: None  Social Needs  . Financial resource strain: None  . Food insecurity - worry: None  . Food insecurity - inability: None  . Transportation needs - medical: None  . Transportation needs - non-medical: None  Occupational History  . 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 Smoking  status: Passive Smoke Exposure - Never Smoker  . Smokeless tobacco: Never Used  Substance and Sexual Activity  . Alcohol use: No    Frequency: Never  . Drug use: No  . Sexual activity: No  Other Topics Concern  . None  Social History Narrative   Here with Uncle and Cousin.     Family History:  The patient's family history includes Cancer in  her sister; Diabetes in her father; Heart disease in her father and mother; Hypertension in her father and mother; Kidney failure in her mother.     ROS:   Please see the history of present illness.    ROS All other systems reviewed and are negative.   PHYSICAL EXAM:   VS:  BP (!) 142/60   Pulse 82   Ht 4\' 10"  (1.473 m)   Wt 107 lb 6.4 oz (48.7 kg)   BMI 22.45 kg/m    GEN: Well nourished, well developed, in no acute distress, elderly and frail. HEENT: normal  Neck: elevated JVD, carotid bruits, or masses Cardiac: RRR; no murmurs, rubs, or gallops, trace bilateral LE edema  Respiratory:  clear to auscultation bilaterally, normal work of breathing GI: soft, nontender, nondistended, + BS MS: no deformity or atrophy  Skin: warm and dry, no rash. Groin sites are stable. Neuro:  Alert and Oriented x 3, Strength and sensation are intact Psych: euthymic mood, full affect   Wt Readings from Last 3 Encounters:  04/07/17 107 lb 6.4 oz (48.7 kg)  04/02/17 103 lb 9.6 oz (47 kg)  03/18/17 105 lb 1.6 oz (47.7 kg)      Studies/Labs Reviewed:   EKG:  EKG is ordered today.  The ekg ordered today demonstrates NSR HR 82  Recent Labs: 03/18/2017: B Natriuretic Peptide 182.0 03/27/2017: ALT 22 03/30/2017: Magnesium 2.0 04/02/2017: BUN 68; Creatinine, Ser 2.27; Hemoglobin 9.4; Platelets 111; Potassium 3.7; Sodium 140   Lipid Panel No results found for: CHOL, TRIG, HDL, CHOLHDL, VLDL, LDLCALC, LDLDIRECT  Additional studies/ records that were reviewed today include:  TAVR OPERATIVE NOTE   Date of  Procedure:03/30/2017  Preoperative Diagnosis:Severe Aortic Stenosis   Postoperative Diagnosis:Same   Procedure:   Transcatheter Aortic Valve Replacement -OpenTransfemoral Approach Edwards Sapien 3 THV (size 43mm, model # 9600TFX, serial # [id number])  Co-Surgeons:Clarence H. Roxy Manns, MD and [name], MD  Anesthesiologist:Charlene Nyoka Cowden, MD  Echocardiographer:Dalton Aundra Dubin, MD  Pre-operative Echo Findings: ? Severe aortic stenosis ? Normalleft ventricular systolic function  Post-operative Echo Findings: ? traceparavalvular leak ? normalleft ventricular systolic function  _________________  Post operative echo: 03/30/17  Study Conclusions - Left ventricle: The cavity size was normal. Wall thickness was normal. Systolic function was normal. The estimated ejection fraction was in the range of 60% to 65%. Wall motion was normal; there were no regional wall motion abnormalities. Doppler parameters are consistent with abnormal left ventricular relaxation (grade 1 diastolic dysfunction). - Aortic valve: A bioprosthesis was present. Valve area (VTI): 1.23 cm^2. Valve area (Vmax): 1.07 cm^2. Valve area (Vmean): 1.2 cm^2. - Mitral valve: Mildly calcified annulus. Moderately thickened, moderately calcified leaflets . The findings are consistent with severe stenosis. Valve area by pressure half-time: 2.14 cm^2. Valve area by continuity equation (using LVOT flow): 1.03 cm^2. - Left atrium: The atrium was severely dilated. - Tricuspid valve: There was mild-moderate regurgitation.   ASSESSMENT & PLAN:   Severe AS s/p TAVR: doing well from surgical standpoint. ECG with NSR and no high grade block. Groin sites are stable. Breathing and energy improved. Holding ASA and plavix given recent acute GI bleed and recurrent blood in stool. Will follow her closely  and see back next week. SBE prophylaxis discussed.   Acute GI bleed: EGD last week showed non-specific distal gastritis and oozing AVM vs. Cameron's erosions; treated with APC cautery. She was started on PPI and Carafate and plavix and ASA were held per GI's recommendations. She had ~1/4 cup of  BBRB in stool yesterday. I have arranged for her to see Dr. Posey Pronto with GI in [place] tomorrow at 11:45am. Plan was to start [facility] ASA today if Hg was stable, but I will hold off given bright red blood in stool yesterday. I will see her next week for close follow up.   Anemia: check CBC today.   DMT2: continue current regimen   Acute on chronic diastolic CHF: her weight is up 5 lbs from DC and she had elevated JVP and orthopnea. She has been treated with IVFs at nursing home for dehydration. I have asked them to stop fluids. I check BMET and BNP today and follow her closely next week.   Medication Adjustments/Labs and Tests Ordered: Current medicines are reviewed at length with the patient today.  Concerns regarding medicines are outlined above.  Medication changes, Labs and Tests ordered today are listed in the Patient Instructions below. Patient Instructions  Medication Instructions:  Your physician recommends that you continue on your current medications as directed. Please refer to the Current Medication list given to you today.   Labwork: TODAY BMET, CBC, PRO BNP  Testing/Procedures: NONE ORDERED TODAY  Follow-Up: 04/14/17 1 PM WITH KATIE Sadye Kiernan, PAC  DR. PATEL 04/08/17 @ 1 PM   Any Other Special Instructions Will Be Listed Below (If Applicable).     If you need a refill on your cardiac medications before your next appointment, please call your pharmacy.      Signed, Angelena Form, PA-C  04/07/2017 2:50 PM    [facility] [address], [place], [place]  [address] Phone: [phone]; Fax: [phone]

## 2017-04-08 ENCOUNTER — Telehealth: Payer: Self-pay | Admitting: *Deleted

## 2017-04-08 ENCOUNTER — Encounter: Payer: Self-pay | Admitting: Physician Assistant

## 2017-04-08 LAB — CBC
Hematocrit: 31.9 % — ABNORMAL LOW (ref 34.0–46.6)
Hemoglobin: 10.2 g/dL — ABNORMAL LOW (ref 11.1–15.9)
MCH: 28.5 pg (ref 26.6–33.0)
MCHC: 32 g/dL (ref 31.5–35.7)
MCV: 89 fL (ref 79–97)
PLATELETS: 116 10*3/uL — AB (ref 150–379)
RBC: 3.58 x10E6/uL — AB (ref 3.77–5.28)
RDW: 14.8 % (ref 12.3–15.4)
WBC: 4.9 10*3/uL (ref 3.4–10.8)

## 2017-04-08 LAB — BASIC METABOLIC PANEL
BUN / CREAT RATIO: 27 (ref 12–28)
BUN: 58 mg/dL — AB (ref 8–27)
CHLORIDE: 108 mmol/L — AB (ref 96–106)
CO2: 17 mmol/L — AB (ref 20–29)
CREATININE: 2.16 mg/dL — AB (ref 0.57–1.00)
Calcium: 9.4 mg/dL (ref 8.7–10.3)
GFR calc Af Amer: 26 mL/min/{1.73_m2} — ABNORMAL LOW (ref >59)
GFR calc non Af Amer: 23 mL/min/{1.73_m2} — ABNORMAL LOW (ref >59)
GLUCOSE: 331 mg/dL — AB (ref 65–99)
Potassium: 4.3 mmol/L (ref 3.5–5.2)
Sodium: 140 mmol/L (ref 134–144)

## 2017-04-08 LAB — PRO B NATRIURETIC PEPTIDE: NT-Pro BNP: 2714 pg/mL — ABNORMAL HIGH (ref 0–301)

## 2017-04-08 NOTE — Telephone Encounter (Signed)
S/w [place], RN for pt today at [facility]. RN aware of recommendations per Nell Range, PA. Start lasix 40 mg daily, start K+ 10 meq daily. Pt is currently scheduled for a follow up 04/14/17 at 1 pm with Nell Range, PA. Per PA pt needs to keep this appt next week. I will fax over results with recommendations per PA to [place], [place] fax # [phone]. RN thanked me for my call today.

## 2017-04-08 NOTE — Telephone Encounter (Signed)
-----   Message from Eileen Stanford, PA-C sent at 04/08/2017  8:54 AM EST ----- Her labs look great. Renal function around baseline and Hg keeps trending up. Her BNP is significantly elevated c/w volume overload. I would like to call in lasix 40mg  daily and [place]. She currently resides at [facility] in [place]. She has an appointment with me next week already scheduled. Thank you!

## 2017-04-13 DIAGNOSIS — J449 Chronic obstructive pulmonary disease, unspecified: Secondary | ICD-10-CM | POA: Insufficient documentation

## 2017-04-13 DIAGNOSIS — D649 Anemia, unspecified: Secondary | ICD-10-CM | POA: Insufficient documentation

## 2017-04-13 DIAGNOSIS — K829 Disease of gallbladder, unspecified: Secondary | ICD-10-CM | POA: Insufficient documentation

## 2017-04-13 DIAGNOSIS — R519 Headache, unspecified: Secondary | ICD-10-CM | POA: Insufficient documentation

## 2017-04-13 DIAGNOSIS — E119 Type 2 diabetes mellitus without complications: Secondary | ICD-10-CM | POA: Insufficient documentation

## 2017-04-13 DIAGNOSIS — J189 Pneumonia, unspecified organism: Secondary | ICD-10-CM | POA: Insufficient documentation

## 2017-04-13 DIAGNOSIS — E559 Vitamin D deficiency, unspecified: Secondary | ICD-10-CM | POA: Insufficient documentation

## 2017-04-13 DIAGNOSIS — I5032 Chronic diastolic (congestive) heart failure: Secondary | ICD-10-CM | POA: Insufficient documentation

## 2017-04-13 DIAGNOSIS — R51 Headache: Secondary | ICD-10-CM

## 2017-04-13 DIAGNOSIS — M1990 Unspecified osteoarthritis, unspecified site: Secondary | ICD-10-CM | POA: Insufficient documentation

## 2017-04-13 DIAGNOSIS — N184 Chronic kidney disease, stage 4 (severe): Secondary | ICD-10-CM | POA: Insufficient documentation

## 2017-04-13 DIAGNOSIS — I2510 Atherosclerotic heart disease of native coronary artery without angina pectoris: Secondary | ICD-10-CM | POA: Insufficient documentation

## 2017-04-13 DIAGNOSIS — I350 Nonrheumatic aortic (valve) stenosis: Secondary | ICD-10-CM | POA: Insufficient documentation

## 2017-04-13 NOTE — Progress Notes (Signed)
[facility] [place]                                       Cardiology Office Note    Date:  04/14/2017   ID:  Sonya Carpenter, DOB [birth year], MRN [id number]  PCP:  Michell Heinrich, DO  Cardiologist: Dr.Zachary (Angelina Sheriff, [place]) / Dr. Burt Knack &Dr. Roxy Manns (TAVR)  CC: CHF- close follow up  History of Present Illness:  Sonya Carpenter is a 71 y.o. 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   Patient states that she has known of the presence of a heart murmur for several years. She was seen approximately 3 years ago by Dr. Rosalita Chessman and echocardiogram reportedly revealed findings consistent with moderate aortic stenosis. Patient was lost to follow-up for a period of time primarily because her husband was in poor health. 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 During that hospitalization she was thought to have a possible abdominal mass with biliary ductal dilitation. She was referred to [facility] in [place] where she was diagnosed withcholedochocholelithiasisby ERCP.She did not have an abdominal mass but she was noted to have some gallstones with intermittent episodes of right upper quadrant abdominal discomfort brought on with large meals. Elective cholecystectomy was planned but preoperative cardiac clearance requested due to the presence of her aortic stenosis. She was subsequently readmitted to the hospital in [place] in September with shortness of breath associated with large right pleural effusion  requiring thoracentesis. She was treated for possible pneumonia. Transthoracic echocardiogram revealed severe aortic stenosis with peak and mean transvalvular gradients reported 121 and 69 mmHg, respectively.Left ventricular systolic function remains normal with ejection fraction estimated 65%. She was referred to Dr. Burt Knack for consultation and underwent left and right heart catheterization by Dr. Angelena Form on February 09, 2017. 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 Peak to peak and mean transvalvular gradients across the aortic valve were measured 35 and 18.2 mmHg corresponding to aortic valve area calculated 1.26 cm squared.Pulmonary function tests and CT angiography was performed and the patient was referred for elective surgical consultation.  Sonya Carpenter was admitted for TAVR on 03/22/17, however we had to cancel the first time due to AKI and acute anemia. She was transfused and hydrated and kept inpatient for close monitoring. She underwent successful TAVR on 03/29/2017 with a17mm Edwards Sapien 3 THV via femoral cut down.She was volume overloaded from acute on chronic diastolic CHF and required IV Lasix.Her hemoglobin continued to drift down and she was found to have heme positive stool. She underwent an upper GI endoscopy which showed non-specific distal gastritis and oozing AVM vs. Cameron's erosions, treated with APC cautery. She was started on PPI and Carafate and plavix and ASA were held per GI's recommendations. She was discharged to [facility].   I saw her in clinic last week for a transition of care visit. She was doing okay at [facility]. She was being treated with  IVFs for worsening renal function. During our visit she reported orthopnea and bright red blood in stool. I arranged for an appointment with her GI doctor (Dr. Posey Pronto) in [place] the following day. She  appeared volume overload and BNP returned ~2700. Hg stable at 10.2 and creat 2.16. I asked the facility to stop IV fluids and I started lasix 40mg  daily.   Today she presents to clinic for close follow up. She saw Dr. Posey Pronto and had an EDG / colonoscopy this past week in [place], [place]. There was no active bleeding but diverticulitis, hemorrhoids and hiatal hernia were noted. She has continued to become progressively more short of breath with wheezing, cough, orthopnea and PND. She feels very weak. She also complains of acute 10/10 groin pain at her femoral cut down site. It hurts with any movement. No chest pain. No more blood in stool.    Past Medical History:  Diagnosis Date  . Anemia   . Aortic stenosis   . Arthritis    back  . Asthma   . CAD (coronary artery disease)   . Chronic diastolic congestive heart failure ([place])   . CKD (chronic kidney disease), stage IV ([place])   . COPD (chronic obstructive pulmonary disease) ([place])   . Diabetes mellitus without complication ([place])    type 2  . Gallbladder disease   . Headache   . Hyperlipidemia   . Hypertension   . Mitral stenosis   . Pneumonia   . 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 Vitamin D deficiency     Past Surgical History:  Procedure Laterality Date  . BREAST CYST EXCISION Right   . COLONSCOPY    . ELBOW SURGERY Left   . ESOPHAGOGASTRODUODENOSCOPY N/A 04/01/2017   Procedure: ESOPHAGOGASTRODUODENOSCOPY (EGD);  Surgeon: Milus Banister, MD;  Location: [facility] ENDOSCOPY;  Service: Endoscopy;  Laterality: N/A;  . KNEE SURGERY    . MULTIPLE EXTRACTIONS WITH ALVEOLOPLASTY N/A 03/14/2017   Procedure: Extraction of tooth #'s 2-11, 15, 19-28 and 31 with alveoloplasty, bilateral mandibular tori reductions and bilateral mandibular lingual exostoses reductions.;  Surgeon: Lenn Cal, DDS;  Location: [facility];  Service:  Oral Surgery;  Laterality: N/A;  . REFRACTIVE SURGERY    . RIGHT/LEFT HEART CATH AND CORONARY ANGIOGRAPHY N/A 02/09/2017   Procedure: RIGHT/LEFT HEART CATH AND CORONARY ANGIOGRAPHY;  Surgeon: Burnell Blanks, MD;  Location: [facility];  Service: Cardiovascular;  Laterality: N/A;  . TEE WITHOUT CARDIOVERSION N/A 03/29/2017   Procedure: TRANSESOPHAGEAL ECHOCARDIOGRAM (TEE);  Surgeon: [name], MD;  Location: [facility];  Service: Open Heart Surgery;  Laterality: N/A;  . TONSILLECTOMY    . TRANSCATHETER AORTIC VALVE REPLACEMENT, TRANSFEMORAL N/A 03/29/2017   Procedure: TRANSCATHETER AORTIC VALVE REPLACEMENT, TRANSFEMORAL;  Surgeon: [name], MD;  Location: [facility];  Service: Open Heart Surgery;  Laterality: N/A;    Current Medications: Outpatient Medications Prior to Visit  Medication Sig Dispense Refill  . acetaminophen (TYLENOL) 500 MG tablet Take 500 mg by mouth every 6 (six) hours as needed for mild pain or moderate pain.    Marland Kitchen albuterol (PROVENTIL HFA;VENTOLIN HFA) 108 (90 Base) MCG/ACT inhaler Inhale 2 puffs every 6 (six) hours as needed into the lungs for wheezing or shortness of breath.     Marland Kitchen atorvastatin (LIPITOR) 20 MG tablet Take 20 mg by mouth daily.    . budesonide-formoterol (SYMBICORT) 160-4.5 MCG/ACT  inhaler Inhale 2 puffs 2 (two) times daily as needed into the lungs (for shortness of breath).     . cefTRIAXone (ROCEPHIN) 1 g injection Inject 1 g into the muscle once.    . cetirizine (ZYRTEC) 10 MG tablet Take 10 mg at bedtime by mouth.     . chlorhexidine (PERIDEX) 0.12 % solution Use as directed 15 mLs in the mouth or throat 2 (two) times daily. 480 mL prn  . Dextran 70-Hypromellose (ARTIFICIAL TEARS) 0.1-0.3 % SOLN Place 2 drops as needed into both eyes (for dry eyes).     Marland Kitchen docusate sodium (COLACE) 100 MG capsule Take 100 mg 2 (two) times daily as needed by mouth for mild constipation.    . feeding supplement, ENSURE ENLIVE, (ENSURE ENLIVE) LIQD Take 237  mLs by mouth 2 (two) times daily between meals. 237 mL 12  . insulin aspart (NOVOLOG) 100 UNIT/ML injection Inject 0-24 Units into the skin every 4 (four) hours. 10 mL 11  . insulin glargine (LANTUS) 100 UNIT/ML injection Inject 0.08 mLs (8 Units total) into the skin daily. 10 mL 11  . ipratropium (ATROVENT) 0.02 % nebulizer solution Take 0.5 mg by nebulization 2 (two) times daily.    Marland Kitchen levofloxacin (LEVAQUIN) 500 MG tablet Take 500 mg by mouth daily.    Marland Kitchen lubiprostone (AMITIZA) 8 MCG capsule Take 8 mcg 2 (two) times daily as needed by mouth for constipation.     . montelukast (SINGULAIR) 10 MG tablet Take 10 mg by mouth at bedtime.    . Multiple Vitamins-Minerals (MULTIVITAMIN WITH MINERALS) tablet Take 1 tablet by mouth daily.    . Naphazoline HCl (CLEAR EYES OP) Place 2 drops 2 (two) times daily into both eyes.    . pantoprazole (PROTONIX) 40 MG tablet Take 1 tablet (40 mg total) by mouth 2 (two) times daily before a meal. 60 tablet 1  . phenazopyridine (PYRIDIUM) 200 MG tablet Take 200 mg by mouth 3 (three) times daily.    . sodium chloride (OCEAN) 0.65 % SOLN nasal spray Place 2 sprays 2 (two) times daily as needed into both nostrils for congestion.     . sucralfate (CARAFATE) 1 GM/10ML suspension Take 10 mLs (1 g total) by mouth 3 (three) times daily with meals. 420 mL 0  . traMADol (ULTRAM) 50 MG tablet Take 1 tablet (50 mg total) by mouth every 12 (twelve) hours as needed for moderate pain. 14 tablet 0   No facility-administered medications prior to visit.      Allergies:   Patient has no known allergies.   Social History   Socioeconomic History  . Marital status: Widowed    Spouse name: None  . Number of children: 0  . Years of education: None  . Highest education level: None  Social Needs  . Financial resource strain: None  . Food insecurity - worry: None  . Food insecurity - inability: None  . Transportation needs - medical: None  . Transportation needs - non-medical: None   Occupational History  . 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 Smoking status: Passive Smoke Exposure - Never Smoker  . Smokeless tobacco: Never Used  Substance and Sexual Activity  . Alcohol use: No    Frequency: Never  . Drug use: No  . Sexual activity: No  Other Topics Concern  . None  Social History Narrative  Here with Uncle and Cousin.     Family History:  The patient's family history includes Cancer in her sister; Diabetes in her father; Heart disease in her father and mother; Hypertension in her father and mother; Kidney failure in her mother.      ROS:   Please see the history of present illness.    ROS All other systems reviewed and are negative.   PHYSICAL EXAM:   VS:  BP 122/76   Pulse 95   Ht 4\' 10"  (1.473 m)   Wt 107 lb 9.6 oz (48.8 kg)   SpO2 92%   BMI 22.49 kg/m    GEN: Well nourished, well developed, in no acute distress. Chronically ill appearing  HEENT: normal  Neck: +JVD, carotid bruits, or masses Cardiac: RRR; soft murmur at RUSB. No rubs, or gallops. 1+ bilateral LE edema  Respiratory: loud wheezing and lung sound congested in all lung fields GI: soft, nontender, nondistended, + BS MS: no deformity or atrophy  Skin: warm and dry, no rash. Right groin site appears stable with no hematoma or ecchymosis. No signs of infection Neuro:  Alert and Oriented x 3, Strength and sensation are intact Psych: euthymic mood, full affect   Wt Readings from Last 3 Encounters:  04/14/17 107 lb 9.6 oz (48.8 kg)  04/07/17 107 lb 6.4 oz (48.7 kg)  04/02/17 103 lb 9.6 oz (47 kg)      Studies/Labs Reviewed:   EKG:  EKG is NOT ordered today.   Recent Labs: 03/18/2017: B Natriuretic Peptide 182.0 03/27/2017: ALT 22 03/30/2017: Magnesium 2.0 04/07/2017: BUN 58; Creatinine, Ser 2.16; Hemoglobin 10.2; NT-Pro BNP 2,714; Platelets 116;  Potassium 4.3; Sodium 140   Lipid Panel No results found for: CHOL, TRIG, HDL, CHOLHDL, VLDL, LDLCALC, LDLDIRECT  Additional studies/ records that were reviewed today include:  TAVR OPERATIVE NOTE   Date of Procedure:03/30/2017  Preoperative Diagnosis:Severe Aortic Stenosis   Postoperative Diagnosis:Same   Procedure:   Transcatheter Aortic Valve Replacement -OpenTransfemoral Approach Edwards Sapien 3 THV (size 49mm, model # 9600TFX, serial # [id number])  Co-Surgeons:Clarence H. Roxy Manns, MD and [name], MD  Anesthesiologist:Charlene Nyoka Cowden, MD  Echocardiographer:Dalton Aundra Dubin, MD  Pre-operative Echo Findings: ? Severe aortic stenosis ? Normalleft ventricular systolic function  Post-operative Echo Findings: ? traceparavalvular leak ? normalleft ventricular systolic function  _________________  Post operative echo:03/30/17  Study Conclusions - Left ventricle: The cavity size was normal. Wall thickness was normal. Systolic function was normal. The estimated ejection fraction was in the range of 60% to 65%. Wall motion was normal; there were no regional wall motion abnormalities. Doppler parameters are consistent with abnormal left ventricular relaxation (grade 1 diastolic dysfunction). - Aortic valve: A bioprosthesis was present. Valve area (VTI): 1.23 cm^2. Valve area (Vmax): 1.07 cm^2. Valve area (Vmean): 1.2 cm^2. - Mitral valve: Mildly calcified annulus. Moderately thickened, moderately calcified leaflets . The findings are consistent with severe stenosis. Valve area by pressure half-time: 2.14 cm^2. Valve area by continuity equation (using LVOT flow): 1.03 cm^2. - Left atrium: The atrium was severely dilated. - Tricuspid valve: There was mild-moderate regurgitation.    ASSESSMENT & PLAN:   Acute on chronic diastolic CHF:  she has continued to become more volume overloaded despite starting lasix 40mg  daily as an outpatient. She has audible wheezing and worsening cough. Her weight is up at least 5 lbs since hospital discharge and she has evidence of volume overload on physical exam. She will require admission and IV diuresis. I tried to directly admit  her from our office, but there are no available beds. She lives in [place], [place] and cannot go home to wait for a bed. I will send her through the ER for admission. Dr. Burt Knack aware of patient and will see later today. I will order BMET, BNP, CBC, CXR and potential groin US/CT upon admission.   Acute groin pain: she is complaining of 10/10 pain at her femoral cut down site. The wound appears to be healing well with no signs of infection, but we may need to get an ultrasound or CT to rule out underlying ischemia.   Severe AS s/p TAVR: doing okay. Valve looked good on post operative echo. Will see back in 1 month for follow up echo. If her blood counts are stable we can start EC ASA 81 mg daily. ASA/Plavix have been held given recent GI bleed.   Acute GI bleed: EGD on 12/7 showed non-specific distal gastritis and oozing AVM vs. Cameron's erosions; treated with APC cautery. She was started on a PPI and Carafate and plavix and ASA were held per GI's recommendations. She had some BRBPR after discharge and I had follow up with her GI Dr in [place] arranged. EGD / colonscopy were perfromed last week. There was no active bleeding but diverticulitis, hemorrhoids and hiatal hernia were noted.  Anemia: check H/H when she gets to hospital   DMT2: continue home regimen. I will order SSI while admitted.    Medication Adjustments/Labs and Tests Ordered: Current medicines are reviewed at length with the patient today.  Concerns regarding medicines are outlined above.  Medication changes, Labs and Tests ordered today are listed in the Patient Instructions below. There are no Patient  Instructions on file for this visit.   Signed, Angelena Form, PA-C  04/14/2017 1:09 PM    [facility] [address], [place], [place]  [address] Phone: [phone]; Fax: [phone]

## 2017-04-14 ENCOUNTER — Emergency Department (HOSPITAL_COMMUNITY): Payer: Medicare HMO

## 2017-04-14 ENCOUNTER — Encounter (HOSPITAL_COMMUNITY): Payer: Self-pay | Admitting: General Practice

## 2017-04-14 ENCOUNTER — Emergency Department (HOSPITAL_COMMUNITY)
Admit: 2017-04-14 | Discharge: 2017-04-14 | Disposition: A | Discharge status: Home / Self Care | Payer: Medicare HMO | Attending: Student | Admitting: Student

## 2017-04-14 ENCOUNTER — Inpatient Hospital Stay (HOSPITAL_COMMUNITY)
Admission: EM | Admit: 2017-04-14 | Discharge: 2017-04-21 | DRG: 291 | Disposition: A | Discharge status: Skilled Nursing Facility | Payer: Medicare HMO | Attending: Internal Medicine | Admitting: Internal Medicine

## 2017-04-14 ENCOUNTER — Ambulatory Visit (INDEPENDENT_AMBULATORY_CARE_PROVIDER_SITE_OTHER): Payer: Self-pay | Admitting: Physician Assistant

## 2017-04-14 ENCOUNTER — Other Ambulatory Visit: Payer: Self-pay

## 2017-04-14 ENCOUNTER — Encounter: Payer: Self-pay | Admitting: Physician Assistant

## 2017-04-14 VITALS — BP 122/76 | HR 95 | Ht <= 58 in | Wt 107.6 lb

## 2017-04-14 DIAGNOSIS — I5033 Acute on chronic diastolic (congestive) heart failure: Secondary | ICD-10-CM

## 2017-04-14 DIAGNOSIS — E875 Hyperkalemia: Secondary | ICD-10-CM | POA: Diagnosis present

## 2017-04-14 DIAGNOSIS — T380X5A Adverse effect of glucocorticoids and synthetic analogues, initial encounter: Secondary | ICD-10-CM | POA: Diagnosis not present

## 2017-04-14 DIAGNOSIS — Z79899 Other long term (current) drug therapy: Secondary | ICD-10-CM | POA: Diagnosis not present

## 2017-04-14 DIAGNOSIS — Z7722 Contact with and (suspected) exposure to environmental tobacco smoke (acute) (chronic): Secondary | ICD-10-CM | POA: Diagnosis present

## 2017-04-14 DIAGNOSIS — D631 Anemia in chronic kidney disease: Secondary | ICD-10-CM | POA: Diagnosis present

## 2017-04-14 DIAGNOSIS — K922 Gastrointestinal hemorrhage, unspecified: Secondary | ICD-10-CM

## 2017-04-14 DIAGNOSIS — D649 Anemia, unspecified: Secondary | ICD-10-CM

## 2017-04-14 DIAGNOSIS — N184 Chronic kidney disease, stage 4 (severe): Secondary | ICD-10-CM | POA: Diagnosis present

## 2017-04-14 DIAGNOSIS — J441 Chronic obstructive pulmonary disease with (acute) exacerbation: Secondary | ICD-10-CM

## 2017-04-14 DIAGNOSIS — E1122 Type 2 diabetes mellitus with diabetic chronic kidney disease: Secondary | ICD-10-CM | POA: Diagnosis present

## 2017-04-14 DIAGNOSIS — M79609 Pain in unspecified limb: Secondary | ICD-10-CM

## 2017-04-14 DIAGNOSIS — I5032 Chronic diastolic (congestive) heart failure: Secondary | ICD-10-CM

## 2017-04-14 DIAGNOSIS — K449 Diaphragmatic hernia without obstruction or gangrene: Secondary | ICD-10-CM | POA: Diagnosis present

## 2017-04-14 DIAGNOSIS — E785 Hyperlipidemia, unspecified: Secondary | ICD-10-CM | POA: Diagnosis present

## 2017-04-14 DIAGNOSIS — I2510 Atherosclerotic heart disease of native coronary artery without angina pectoris: Secondary | ICD-10-CM | POA: Diagnosis present

## 2017-04-14 DIAGNOSIS — Z952 Presence of prosthetic heart valve: Secondary | ICD-10-CM

## 2017-04-14 DIAGNOSIS — K649 Unspecified hemorrhoids: Secondary | ICD-10-CM | POA: Diagnosis present

## 2017-04-14 DIAGNOSIS — I130 Hypertensive heart and chronic kidney disease with heart failure and stage 1 through stage 4 chronic kidney disease, or unspecified chronic kidney disease: Principal | ICD-10-CM | POA: Diagnosis present

## 2017-04-14 DIAGNOSIS — Z794 Long term (current) use of insulin: Secondary | ICD-10-CM

## 2017-04-14 DIAGNOSIS — N179 Acute kidney failure, unspecified: Secondary | ICD-10-CM | POA: Diagnosis present

## 2017-04-14 DIAGNOSIS — E1165 Type 2 diabetes mellitus with hyperglycemia: Secondary | ICD-10-CM | POA: Diagnosis not present

## 2017-04-14 DIAGNOSIS — J449 Chronic obstructive pulmonary disease, unspecified: Secondary | ICD-10-CM | POA: Diagnosis present

## 2017-04-14 DIAGNOSIS — R1031 Right lower quadrant pain: Secondary | ICD-10-CM

## 2017-04-14 DIAGNOSIS — K5792 Diverticulitis of intestine, part unspecified, without perforation or abscess without bleeding: Secondary | ICD-10-CM | POA: Diagnosis present

## 2017-04-14 DIAGNOSIS — I5030 Unspecified diastolic (congestive) heart failure: Secondary | ICD-10-CM | POA: Diagnosis present

## 2017-04-14 DIAGNOSIS — J9601 Acute respiratory failure with hypoxia: Secondary | ICD-10-CM | POA: Diagnosis not present

## 2017-04-14 DIAGNOSIS — I10 Essential (primary) hypertension: Secondary | ICD-10-CM | POA: Diagnosis present

## 2017-04-14 DIAGNOSIS — E119 Type 2 diabetes mellitus without complications: Secondary | ICD-10-CM

## 2017-04-14 DIAGNOSIS — I509 Heart failure, unspecified: Secondary | ICD-10-CM

## 2017-04-14 DIAGNOSIS — J9621 Acute and chronic respiratory failure with hypoxia: Secondary | ICD-10-CM | POA: Diagnosis present

## 2017-04-14 DIAGNOSIS — I350 Nonrheumatic aortic (valve) stenosis: Secondary | ICD-10-CM | POA: Diagnosis present

## 2017-04-14 DIAGNOSIS — R0600 Dyspnea, unspecified: Secondary | ICD-10-CM

## 2017-04-14 HISTORY — DX: Unspecified chronic bronchitis: J42

## 2017-04-14 HISTORY — DX: Personal history of other diseases of the digestive system: Z87.19

## 2017-04-14 HISTORY — DX: Type 2 diabetes mellitus without complications: E11.9

## 2017-04-14 HISTORY — DX: Personal history of other medical treatment: Z92.89

## 2017-04-14 HISTORY — DX: Gastro-esophageal reflux disease without esophagitis: K21.9

## 2017-04-14 LAB — CBC
HEMATOCRIT: 29.8 % — AB (ref 36.0–46.0)
Hemoglobin: 9.5 g/dL — ABNORMAL LOW (ref 12.0–15.0)
MCH: 29 pg (ref 26.0–34.0)
MCHC: 31.9 g/dL (ref 30.0–36.0)
MCV: 90.9 fL (ref 78.0–100.0)
PLATELETS: 83 10*3/uL — AB (ref 150–400)
RBC: 3.28 MIL/uL — ABNORMAL LOW (ref 3.87–5.11)
RDW: 14.7 % (ref 11.5–15.5)
WBC: 3.7 10*3/uL — AB (ref 4.0–10.5)

## 2017-04-14 LAB — GLUCOSE, CAPILLARY: Glucose-Capillary: 332 mg/dL — ABNORMAL HIGH (ref 65–99)

## 2017-04-14 LAB — BASIC METABOLIC PANEL
Anion gap: 11 (ref 5–15)
BUN: 80 mg/dL — AB (ref 6–20)
CHLORIDE: 103 mmol/L (ref 101–111)
CO2: 21 mmol/L — ABNORMAL LOW (ref 22–32)
CREATININE: 2.66 mg/dL — AB (ref 0.44–1.00)
Calcium: 9.7 mg/dL (ref 8.9–10.3)
GFR calc Af Amer: 20 mL/min — ABNORMAL LOW (ref >60)
GFR calc non Af Amer: 17 mL/min — ABNORMAL LOW (ref >60)
GLUCOSE: 586 mg/dL — AB (ref 65–99)
Potassium: 4.2 mmol/L (ref 3.5–5.1)
SODIUM: 135 mmol/L (ref 135–145)

## 2017-04-14 LAB — I-STAT TROPONIN, ED: Troponin i, poc: 0.01 ng/mL (ref 0.00–0.08)

## 2017-04-14 LAB — CBG MONITORING, ED
GLUCOSE-CAPILLARY: 475 mg/dL — AB (ref 65–99)
Glucose-Capillary: 487 mg/dL — ABNORMAL HIGH (ref 65–99)

## 2017-04-14 MED ORDER — MONTELUKAST SODIUM 10 MG PO TABS
10.0000 mg | ORAL_TABLET | Freq: Every day | ORAL | Status: DC
  Administered 2017-04-14 – 2017-04-20 (×7): 10 mg via ORAL
  Filled 2017-04-14 (×7): qty 1

## 2017-04-14 MED ORDER — LORATADINE 10 MG PO TABS
10.0000 mg | ORAL_TABLET | Freq: Every day | ORAL | Status: DC
  Administered 2017-04-15 – 2017-04-21 (×7): 10 mg via ORAL
  Filled 2017-04-14 (×7): qty 1

## 2017-04-14 MED ORDER — FUROSEMIDE 10 MG/ML IJ SOLN: 80.0000 mg | Freq: Two times a day (BID) | INTRAMUSCULAR | Status: DC

## 2017-04-14 MED ORDER — ATORVASTATIN CALCIUM 20 MG PO TABS: 20.0000 mg | ORAL_TABLET | Freq: Every day | ORAL | Status: DC

## 2017-04-14 MED ORDER — SODIUM CHLORIDE 0.9 % IV SOLN: 250.0000 mL | INTRAVENOUS | Status: DC | PRN

## 2017-04-14 MED ORDER — INSULIN ASPART 100 UNIT/ML ~~LOC~~ SOLN
0.0000 [IU] | Freq: Three times a day (TID) | SUBCUTANEOUS | Status: DC
  Administered 2017-04-14: 9 [IU] via SUBCUTANEOUS
  Administered 2017-04-15: 5 [IU] via SUBCUTANEOUS
  Administered 2017-04-15: 1 [IU] via SUBCUTANEOUS
  Administered 2017-04-15: 3 [IU] via SUBCUTANEOUS
  Filled 2017-04-14: qty 1

## 2017-04-14 MED ORDER — HEPARIN SODIUM (PORCINE) 5000 UNIT/ML IJ SOLN
5000.0000 [IU] | Freq: Three times a day (TID) | INTRAMUSCULAR | Status: DC
  Administered 2017-04-14 – 2017-04-21 (×19): 5000 [IU] via SUBCUTANEOUS
  Filled 2017-04-14 (×18): qty 1

## 2017-04-14 MED ORDER — ALBUTEROL SULFATE HFA 108 (90 BASE) MCG/ACT IN AERS
2.0000 | INHALATION_SPRAY | Freq: Four times a day (QID) | RESPIRATORY_TRACT | Status: DC | PRN
  Administered 2017-04-14: 2 via RESPIRATORY_TRACT
  Filled 2017-04-14: qty 6.7

## 2017-04-14 MED ORDER — INSULIN ASPART 100 UNIT/ML ~~LOC~~ SOLN
0.0000 [IU] | Freq: Every day | SUBCUTANEOUS | Status: DC
  Administered 2017-04-15: 2 [IU] via SUBCUTANEOUS

## 2017-04-14 MED ORDER — ONDANSETRON HCL 4 MG/2ML IJ SOLN
4.0000 mg | Freq: Four times a day (QID) | INTRAMUSCULAR | Status: DC | PRN
  Administered 2017-04-16: 4 mg via INTRAVENOUS
  Filled 2017-04-14: qty 2

## 2017-04-14 MED ORDER — PANTOPRAZOLE SODIUM 40 MG PO TBEC
40.0000 mg | DELAYED_RELEASE_TABLET | Freq: Two times a day (BID) | ORAL | Status: DC
  Administered 2017-04-15 – 2017-04-21 (×13): 40 mg via ORAL
  Filled 2017-04-14 (×14): qty 1

## 2017-04-14 MED ORDER — ALBUTEROL SULFATE (2.5 MG/3ML) 0.083% IN NEBU: 2.5000 mg | INHALATION_SOLUTION | Freq: Four times a day (QID) | RESPIRATORY_TRACT | Status: DC | PRN

## 2017-04-14 MED ORDER — ENSURE ENLIVE PO LIQD
237.0000 mL | Freq: Two times a day (BID) | ORAL | Status: DC
  Administered 2017-04-15 – 2017-04-21 (×10): 237 mL via ORAL
  Filled 2017-04-14: qty 237

## 2017-04-14 MED ORDER — MOMETASONE FURO-FORMOTEROL FUM 200-5 MCG/ACT IN AERO
2.0000 | INHALATION_SPRAY | Freq: Two times a day (BID) | RESPIRATORY_TRACT | Status: DC
Start: 2017-04-15 — End: 2017-04-21
  Administered 2017-04-15 – 2017-04-21 (×13): 2 via RESPIRATORY_TRACT
  Filled 2017-04-14: qty 8.8

## 2017-04-14 MED ORDER — GUAIFENESIN-CODEINE 100-10 MG/5ML PO SOLN
10.0000 mL | ORAL | Status: DC | PRN
  Administered 2017-04-14 – 2017-04-16 (×3): 10 mL via ORAL
  Filled 2017-04-14 (×4): qty 10

## 2017-04-14 MED ORDER — DOCUSATE SODIUM 100 MG PO CAPS
100.0000 mg | ORAL_CAPSULE | Freq: Two times a day (BID) | ORAL | Status: DC | PRN
  Administered 2017-04-16 – 2017-04-20 (×2): 100 mg via ORAL
  Filled 2017-04-14 (×2): qty 1

## 2017-04-14 MED ORDER — IPRATROPIUM BROMIDE 0.02 % IN SOLN
0.5000 mg | Freq: Two times a day (BID) | RESPIRATORY_TRACT | Status: DC
  Administered 2017-04-14 – 2017-04-17 (×7): 0.5 mg via RESPIRATORY_TRACT
  Filled 2017-04-14 (×8): qty 2.5

## 2017-04-14 MED ORDER — ACETAMINOPHEN 500 MG PO TABS: 500.0000 mg | ORAL_TABLET | Freq: Four times a day (QID) | ORAL | Status: DC | PRN

## 2017-04-14 MED ORDER — SODIUM CHLORIDE 0.9% FLUSH
3.0000 mL | Freq: Two times a day (BID) | INTRAVENOUS | Status: DC
  Administered 2017-04-14 – 2017-04-21 (×13): 3 mL via INTRAVENOUS

## 2017-04-14 MED ORDER — DEXTRAN 70-HYPROMELLOSE 0.1-0.3 % OP SOLN: 2.0000 [drp] | OPHTHALMIC | Status: DC | PRN

## 2017-04-14 MED ORDER — SODIUM CHLORIDE 0.9% FLUSH: 3.0000 mL | INTRAVENOUS | Status: DC | PRN

## 2017-04-14 MED ORDER — PHENAZOPYRIDINE HCL 200 MG PO TABS: 200.0000 mg | ORAL_TABLET | Freq: Three times a day (TID) | ORAL | Status: DC

## 2017-04-14 MED ORDER — FUROSEMIDE 10 MG/ML IJ SOLN
80.0000 mg | Freq: Once | INTRAMUSCULAR | Status: AC
  Administered 2017-04-14: 80 mg via INTRAVENOUS
  Filled 2017-04-14: qty 8

## 2017-04-14 MED ORDER — ACETAMINOPHEN 325 MG PO TABS: 650.0000 mg | ORAL_TABLET | ORAL | Status: DC | PRN

## 2017-04-14 MED ORDER — SALINE SPRAY 0.65 % NA SOLN
2.0000 | Freq: Two times a day (BID) | NASAL | Status: DC | PRN
  Filled 2017-04-14: qty 44

## 2017-04-14 MED ORDER — TRAMADOL HCL 50 MG PO TABS
50.0000 mg | ORAL_TABLET | Freq: Two times a day (BID) | ORAL | Status: DC | PRN
  Administered 2017-04-14: 50 mg via ORAL
  Filled 2017-04-14: qty 1

## 2017-04-14 MED ORDER — SUCRALFATE 1 GM/10ML PO SUSP
1.0000 g | Freq: Three times a day (TID) | ORAL | Status: DC
  Administered 2017-04-15 – 2017-04-21 (×20): 1 g via ORAL
  Filled 2017-04-14 (×20): qty 10

## 2017-04-14 MED ORDER — LUBIPROSTONE 8 MCG PO CAPS
8.0000 ug | ORAL_CAPSULE | Freq: Two times a day (BID) | ORAL | Status: DC | PRN
  Administered 2017-04-16: 8 ug via ORAL
  Filled 2017-04-14 (×3): qty 1

## 2017-04-14 MED ORDER — INSULIN ASPART 100 UNIT/ML ~~LOC~~ SOLN
3.0000 [IU] | Freq: Three times a day (TID) | SUBCUTANEOUS | Status: DC
  Administered 2017-04-15 – 2017-04-21 (×17): 3 [IU] via SUBCUTANEOUS

## 2017-04-14 MED ORDER — IPRATROPIUM-ALBUTEROL 0.5-2.5 (3) MG/3ML IN SOLN
3.0000 mL | Freq: Four times a day (QID) | RESPIRATORY_TRACT | Status: DC | PRN
  Administered 2017-04-14: 3 mL via RESPIRATORY_TRACT
  Filled 2017-04-14: qty 3

## 2017-04-14 NOTE — ED Provider Notes (Signed)
[facility] EMERGENCY DEPARTMENT Provider Note   CSN: [id number] Arrival date & time: 04/14/17  1354     History   Chief Complaint Chief Complaint  Patient presents with  . Shortness of Breath  . Groin Pain    HPI Sonya Carpenter is a 71 y.o. female.  HPI 71 year old female who is 15 days status post TAVR who was seen and evaluated in the cardiology clinic today and sent to the emergency department for admission the hospital for volume overload.  She is also having right groin pain and they have ordered an ultrasound to evaluate for pseudoaneurysm.  Patient denies fevers and chills.  She denies cough.  She denies unilateral leg swelling.  She reports ongoing right groin pain.  She denies significant orthopnea.   Past Medical History:  Diagnosis Date  . Anemia   . Aortic stenosis   . Arthritis    back  . Asthma   . CAD (coronary artery disease)   . Chronic diastolic congestive heart failure ([place])   . CKD (chronic kidney disease), stage IV ([place])   . COPD (chronic obstructive pulmonary disease) ([place])   . Diabetes mellitus without complication ([place])    type 2  . Gallbladder disease   . Headache   . Hyperlipidemia   . Hypertension   . Mitral stenosis   . Pneumonia   . 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 Vitamin D deficiency     Patient Active Problem List   Diagnosis Date Noted  . Vitamin D deficiency   . Pneumonia   . Headache   . Diabetes mellitus without complication ([place])   . Gallbladder disease   . COPD (chronic obstructive pulmonary disease) ([place])   . CKD (chronic kidney disease), stage IV ([place])   . Chronic diastolic congestive heart failure ([place])   . CAD (coronary artery disease)   . Arthritis   . Aortic stenosis   . Anemia   . Gastritis and gastroduodenitis   . S/P TAVR (transcatheter aortic valve replacement)  03/29/2017  . Pressure injury of skin 03/24/2017  . Acute blood loss anemia 03/22/2017  . Anemia, iron deficiency   . Mitral stenosis   . Acute on chronic diastolic heart failure ([place])   . AKI (acute kidney injury) ([place]) 03/14/2017  . CKD (chronic kidney disease) stage 4, GFR 15-29 ml/min ([facility]) 03/09/2017  . Cholecystitis 03/09/2017  . Hypertension 03/09/2017  . Hyperlipidemia 03/09/2017  . Type 2 diabetes mellitus without complication, with long-term current use of insulin ([place]) 03/09/2017  . Asthma 03/09/2017  . 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 Retained dental roots 03/09/2017  . Dental caries 03/09/2017  . Chronic periodontitis 03/09/2017  . 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 Loose, teeth 03/09/2017  . Severe aortic stenosis     Past Surgical History:  Procedure Laterality Date  . BREAST CYST EXCISION Right   . COLONSCOPY    . ELBOW SURGERY Left   . ESOPHAGOGASTRODUODENOSCOPY N/A 04/01/2017   Procedure: ESOPHAGOGASTRODUODENOSCOPY (EGD);  Surgeon: Milus Banister, MD;  Location: [facility] ENDOSCOPY;  Service: Endoscopy;  Laterality: N/A;  . KNEE SURGERY    . MULTIPLE EXTRACTIONS WITH ALVEOLOPLASTY N/A 03/14/2017   Procedure: Extraction of tooth #'s 2-11, 15, 19-28 and 31 with alveoloplasty, bilateral mandibular tori reductions and bilateral mandibular lingual exostoses reductions.;  Surgeon: Lenn Cal, DDS;  Location: [facility] OR;  Service: Oral Surgery;  Laterality: N/A;  . REFRACTIVE SURGERY    . RIGHT/LEFT HEART CATH AND CORONARY ANGIOGRAPHY N/A 02/09/2017   Procedure: RIGHT/LEFT HEART CATH AND CORONARY ANGIOGRAPHY;  Surgeon: Burnell Blanks, MD;  Location: [facility];  Service: Cardiovascular;  Laterality: N/A;  . TEE WITHOUT CARDIOVERSION N/A 03/29/2017   Procedure: TRANSESOPHAGEAL ECHOCARDIOGRAM (TEE);  Surgeon: [name], MD;  Location: [facility];  Service: Open Heart Surgery;  Laterality: N/A;  . TONSILLECTOMY      . TRANSCATHETER AORTIC VALVE REPLACEMENT, TRANSFEMORAL N/A 03/29/2017   Procedure: TRANSCATHETER AORTIC VALVE REPLACEMENT, TRANSFEMORAL;  Surgeon: [name], MD;  Location: [facility];  Service: Open Heart Surgery;  Laterality: N/A;    OB History    No data available       Home Medications    Prior to Admission medications   Medication Sig Start Date End Date Taking? Authorizing Provider  acetaminophen (TYLENOL) 500 MG tablet Take 500 mg by mouth every 6 (six) hours as needed for mild pain or moderate pain.    [provider]  albuterol (PROVENTIL HFA;VENTOLIN HFA) 108 (90 Base) MCG/ACT inhaler Inhale 2 puffs every 6 (six) hours as needed into the lungs for wheezing or shortness of breath.     [provider]  atorvastatin (LIPITOR) 20 MG tablet Take 20 mg by mouth daily. 01/31/17   [provider]  budesonide-formoterol (SYMBICORT) 160-4.5 MCG/ACT inhaler Inhale 2 puffs 2 (two) times daily as needed into the lungs (for shortness of breath).     [provider]  cefTRIAXone (ROCEPHIN) 1 g injection Inject 1 g into the muscle once.    [provider]  cetirizine (ZYRTEC) 10 MG tablet Take 10 mg at bedtime by mouth.     [provider]  chlorhexidine (PERIDEX) 0.12 % solution Use as directed 15 mLs in the mouth or throat 2 (two) times daily. 03/16/17   Eileen Stanford, PA-C  Dextran 70-Hypromellose (ARTIFICIAL TEARS) 0.1-0.3 % SOLN Place 2 drops as needed into both eyes (for dry eyes).     [provider]  docusate sodium (COLACE) 100 MG capsule Take 100 mg 2 (two) times daily as needed by mouth for mild constipation.    [provider]  feeding supplement, ENSURE ENLIVE, (ENSURE ENLIVE) LIQD Take 237 mLs by mouth 2 (two) times daily between meals. 04/02/17   Elgie Collard, PA-C  insulin aspart (NOVOLOG) 100 UNIT/ML injection Inject 0-24 Units into the skin every 4 (four) hours. 04/02/17   Elgie Collard, PA-C  insulin  glargine (LANTUS) 100 UNIT/ML injection Inject 0.08 mLs (8 Units total) into the skin daily. 04/02/17   Elgie Collard, PA-C  ipratropium (ATROVENT) 0.02 % nebulizer solution Take 0.5 mg by nebulization 2 (two) times daily.    [provider]  levofloxacin (LEVAQUIN) 500 MG tablet Take 500 mg by mouth daily.    [provider]  lubiprostone (AMITIZA) 8 MCG capsule Take 8 mcg 2 (two) times daily as needed by mouth for constipation.     [provider]  montelukast (SINGULAIR) 10 MG tablet Take 10 mg by mouth at bedtime.    [provider]  Multiple Vitamins-Minerals (MULTIVITAMIN WITH MINERALS) tablet Take 1 tablet by mouth daily.    [provider]  Naphazoline HCl (CLEAR EYES OP) Place 2 drops 2 (two) times daily into both eyes.    [provider]  pantoprazole (PROTONIX) 40 MG tablet Take 1  tablet (40 mg total) by mouth 2 (two) times daily before a meal. 04/02/17   Elgie Collard, PA-C  phenazopyridine (PYRIDIUM) 200 MG tablet Take 200 mg by mouth 3 (three) times daily.    [provider]  sodium chloride (OCEAN) 0.65 % SOLN nasal spray Place 2 sprays 2 (two) times daily as needed into both nostrils for congestion.     [provider]  sucralfate (CARAFATE) 1 GM/10ML suspension Take 10 mLs (1 g total) by mouth 3 (three) times daily with meals. 04/02/17   Elgie Collard, PA-C  traMADol (ULTRAM) 50 MG tablet Take 1 tablet (50 mg total) by mouth every 12 (twelve) hours as needed for moderate pain. 04/02/17   Elgie Collard, PA-C    Family History Family History  Problem Relation Age of Onset  . Hypertension Mother   . Kidney failure Mother   . Heart disease Mother   . Hypertension Father   . Heart disease Father   . Diabetes Father   . Cancer Sister     Social History Social History   Tobacco Use  . Smoking status: Passive Smoke Exposure - Never Smoker  . Smokeless tobacco: Never Used  Substance Use Topics  . Alcohol  use: No    Frequency: Never  . Drug use: No     Allergies   Patient has no known allergies.   Review of Systems Review of Systems  All other systems reviewed and are negative.    Physical Exam Updated Vital Signs BP (!) 146/59   Pulse 81   Temp 97.8 F (36.6 C) (Oral)   Resp 17   SpO2 94%   Physical Exam  Constitutional: She is oriented to person, place, and time. She appears well-developed and well-nourished. No distress.  HENT:  Head: Normocephalic and atraumatic.  Eyes: EOM are normal.  Neck: Normal range of motion.  Cardiovascular: Normal rate, regular rhythm and normal heart sounds.  Pulmonary/Chest: Effort normal and breath sounds normal.  Abdominal: Soft. She exhibits no distension. There is no tenderness.  Musculoskeletal: Normal range of motion.  Stitches in place in right groin.  No obvious pulsatile mass.  No erythema or drainage.  Neurological: She is alert and oriented to person, place, and time.  Skin: Skin is warm and dry.  Psychiatric: She has a normal mood and affect. Judgment normal.  Nursing note and vitals reviewed.    ED Treatments / Results  Labs (all labs ordered are listed, but only abnormal results are displayed) Labs Reviewed  BASIC METABOLIC PANEL - Abnormal; Notable for the following components:      Result Value   CO2 21 (*)    Glucose, Bld 586 (*)    BUN 80 (*)    Creatinine, Ser 2.66 (*)    GFR calc non Af Amer 17 (*)    GFR calc Af Amer 20 (*)    All other components within normal limits  CBC - Abnormal; Notable for the following components:   WBC 3.7 (*)    RBC 3.28 (*)    Hemoglobin 9.5 (*)    HCT 29.8 (*)    Platelets 83 (*)    All other components within normal limits  I-STAT TROPONIN, ED    EKG  EKG Interpretation  Date/Time:  Thursday April 14 2017 15:38:07 EST Ventricular Rate:  82 PR Interval:    QRS Duration: 98 QT Interval:  382 QTC Calculation: 447 R Axis:   41 Text Interpretation:  Sinus  rhythm  No significant change was found Confirmed by Jola Schmidt [phone]) on 04/14/2017 4:18:26 PM       Radiology Dg Chest 2 View  Result Date: 04/14/2017 CLINICAL DATA:  71 year old one-week history of shortness of breath, nonproductive cough and right groin pain. TAVR procedure earlier this month from a right femoral artery approach. EXAM: CHEST  2 VIEW COMPARISON:  03/29/2017, 03/25/2017 and earlier, including CT chest 03/01/2017. FINDINGS: AP erect and lateral images were obtained. TAVR, with the valve struts in the expected orientation. Cardiac silhouette mildly enlarged for technique, unchanged. Thoracic aorta mildly atherosclerotic, unchanged. Hilar and mediastinal contours otherwise unremarkable. Linear scarring in the lingula and the right middle lobe, unchanged. Lungs otherwise clear. Mild pulmonary venous hypertension without overt edema. No pleural effusions. Osseous demineralization, exaggeration of the usual thoracic kyphosis as noted previously. IMPRESSION: 1.  No acute cardiopulmonary disease. 2. Stable mild cardiomegaly without evidence of pulmonary edema. 3. Stable scarring in the lingula and right middle lobe. 4.  Aortic Atherosclerosis (ICD10-170.0) Electronically Signed   By: Evangeline Dakin M.D.   On: 04/14/2017 15:58    Procedures Procedures (including critical care time)  Medications Ordered in ED Medications  furosemide (LASIX) injection 80 mg (not administered)  furosemide (LASIX) injection 80 mg (not administered)     Initial Impression / Assessment and Plan / ED Course  I have reviewed the triage vital signs and the nursing notes.  Pertinent labs & imaging results that were available during my care of the patient were reviewed by me and considered in my medical decision making (see chart for details).     Admit to hospital for volume overload.  Cardiology has placed orders.  Patient is stable at this time.  Final Clinical Impressions(s) / ED Diagnoses   Final  diagnoses:  Dyspnea, unspecified type    ED Discharge Orders    None       Jola Schmidt, MD 04/14/17 1620

## 2017-04-14 NOTE — H&P (Signed)
Cardiology Admission History and Physical:   Patient ID: Sonya Carpenter; [id number]; 1945/09/01   Admission date: (Not on file)  Primary Care Provider: Michell Heinrich, DO Primary Cardiologist: Dr.Zachary [facility], [place]) / Dr. Burt Knack &Dr. Roxy Manns (TAVR)  Chief Complaint:  SOB, cough, groin pain   Patient Profile:   Sonya Carpenter is a 71 y.o. female with a history of CKD stage IV, insulin dependant DMT2 with multiple complications,HTN,asthma, COPD 2/2 second hand smoke exposure, chronic diastolic CHF, chronic anemia, recent GI bleeding s/p APC,andsevere aortic stenosis s/p TAVR (03/29/17) who was sent from cardiology clinic to [facility] ED today for admission for acute CHF and evaluation of acute groin pain.   History of Present Illness:   Patient states that she has known of the presence of a heart murmur for several years. She was seen approximately 3 years ago by Dr. Rosalita Chessman and echocardiogram reportedly revealed findings consistent with moderate aortic stenosis. Patient was lost to follow-up for a period of time primarily because her husband was in poor health. 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 During that hospitalization she was thought to have a possible abdominal mass with biliary ductal dilitation. She was referred to [facility] in [place] where she was diagnosed withcholedochocholelithiasisby ERCP.She did not have an abdominal mass but she was noted to have some gallstones with intermittent episodes of right upper quadrant abdominal discomfort brought on with large meals. Elective cholecystectomy was planned but preoperative cardiac clearance requested due to the presence of her aortic stenosis. She was subsequently readmitted to the hospital in [place] in September  with shortness of breath associated with large right pleural effusion requiring thoracentesis. She was treated for possible pneumonia. Transthoracic echocardiogram revealed severe aortic stenosis with peak and mean transvalvular gradients reported 121 and 69 mmHg, respectively.Left ventricular systolic function remains normal with ejection fraction estimated 65%. She was referred to Dr. Burt Knack for consultation and underwent left and right heart catheterization by Dr. Angelena Form on February 09, 2017. 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 Peak to peak and mean transvalvular gradients across the aortic valve were measured 35 and 18.2 mmHg corresponding to aortic valve area calculated 1.26 cm squared.Pulmonary function tests and CT angiography was performed and the patient was referred for elective surgical consultation. She was evaluated by the multidisciplinary valve team and felt to be a suitable TAVR candidate.   Sonya Carpenter was admitted for [facility] 03/22/17, however wehad to cancel the first time due to AKI and acute anemia. She was transfused and hydrated andkept inpatient for close monitoring. She eventually underwent successful TAVR on 03/29/2017 with a61mm Edwards Sapien 3 THV via femoral cut down.She was volume overloaded from acute on chronic diastolic CHF and required IV Lasix.Her hemoglobin continued to drift down and she was found to have heme positive stool. She underwent an upper GI endoscopy which showed non-specific distal gastritisandoozing AVM vs. Cameron's erosions, treated with APC cautery.She was started onPPI and Carafate and plavix and ASAwere heldper GI's recommendations. She was discharged to [facility].   I saw her in clinic last week for a transition of care visit. She was doing okay at [facility]. She was being treated with IVFs for worsening renal  function. During our visit she reported orthopnea and bright red  blood in stool. I arranged for an appointment with her GI doctor (Dr. Posey Pronto) in [place] the following day. She appeared volume overload and BNP returned ~2700. Hg stable at 10.2 and creat 2.16. I asked the facility to stop IV fluids and I started lasix 40mg  daily.   Today she presents to clinic for close follow up. She saw Dr. Posey Pronto and had an EDG / colonoscopy this past week in [place], [place]. There was no active bleeding but diverticulitis, hemorrhoids and hiatal hernia were noted. She has continued to become progressively more short of breath with wheezing, cough, orthopnea and PND. She feels very weak. She also complains of acute 10/10 groin pain at her femoral cut down site. It hurts with any movement. No chest pain. No more blood in stool.     Past Medical History:  Diagnosis Date  . Anemia   . Aortic stenosis   . Arthritis    back  . Asthma   . CAD (coronary artery disease)   . Chronic diastolic congestive heart failure ([place])   . CKD (chronic kidney disease), stage IV ([place])   . COPD (chronic obstructive pulmonary disease) ([place])   . Diabetes mellitus without complication ([place])    type 2  . Gallbladder disease   . Headache   . Hyperlipidemia   . Hypertension   . Mitral stenosis   . Pneumonia   . 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 Vitamin D deficiency     Past Surgical History:  Procedure Laterality Date  . BREAST CYST EXCISION Right   . COLONSCOPY    . ELBOW SURGERY Left   . ESOPHAGOGASTRODUODENOSCOPY N/A 04/01/2017   Procedure: ESOPHAGOGASTRODUODENOSCOPY (EGD);  Surgeon: Milus Banister, MD;  Location: [facility] ENDOSCOPY;  Service: Endoscopy;  Laterality: N/A;  . KNEE SURGERY    . MULTIPLE EXTRACTIONS WITH ALVEOLOPLASTY N/A 03/14/2017   Procedure: Extraction of tooth #'s 2-11, 15, 19-28 and  31 with alveoloplasty, bilateral mandibular tori reductions and bilateral mandibular lingual exostoses reductions.;  Surgeon: Lenn Cal, DDS;  Location: [place];  Service: Oral Surgery;  Laterality: N/A;  . REFRACTIVE SURGERY    . RIGHT/LEFT HEART CATH AND CORONARY ANGIOGRAPHY N/A 02/09/2017   Procedure: RIGHT/LEFT HEART CATH AND CORONARY ANGIOGRAPHY;  Surgeon: Burnell Blanks, MD;  Location: [place] CV LAB;  Service: Cardiovascular;  Laterality: N/A;  . TEE WITHOUT CARDIOVERSION N/A 03/29/2017   Procedure: TRANSESOPHAGEAL ECHOCARDIOGRAM (TEE);  Surgeon: [name], MD;  Location: [place];  Service: Open Heart Surgery;  Laterality: N/A;  . TONSILLECTOMY    . TRANSCATHETER AORTIC VALVE REPLACEMENT, TRANSFEMORAL N/A 03/29/2017   Procedure: TRANSCATHETER AORTIC VALVE REPLACEMENT, TRANSFEMORAL;  Surgeon: [name], MD;  Location: [place];  Service: Open Heart Surgery;  Laterality: N/A;     Medications Prior to Admission: Prior to Admission medications   Medication Sig Start Date End Date Taking? Authorizing Provider  acetaminophen (TYLENOL) 500 MG tablet Take 500 mg by mouth every 6 (six) hours as needed for mild pain or moderate pain.    [provider]  albuterol (PROVENTIL HFA;VENTOLIN HFA) 108 (90 Base) MCG/ACT inhaler Inhale 2 puffs every 6 (six) hours as needed into the lungs for wheezing or shortness of breath.     [provider]  atorvastatin (LIPITOR) 20 MG tablet Take 20 mg by mouth daily. 01/31/17   [provider]  budesonide-formoterol (SYMBICORT)  160-4.5 MCG/ACT inhaler Inhale 2 puffs 2 (two) times daily as needed into the lungs (for shortness of breath).     [provider]  cefTRIAXone (ROCEPHIN) 1 g injection Inject 1 g into the muscle once.    [provider]  cetirizine (ZYRTEC) 10 MG tablet Take 10 mg at bedtime by mouth.     [provider]  chlorhexidine (PERIDEX) 0.12 % solution Use as directed 15 mLs  in the mouth or throat 2 (two) times daily. 03/16/17   Eileen Stanford, PA-C  Dextran 70-Hypromellose (ARTIFICIAL TEARS) 0.1-0.3 % SOLN Place 2 drops as needed into both eyes (for dry eyes).     [provider]  docusate sodium (COLACE) 100 MG capsule Take 100 mg 2 (two) times daily as needed by mouth for mild constipation.    [provider]  feeding supplement, ENSURE ENLIVE, (ENSURE ENLIVE) LIQD Take 237 mLs by mouth 2 (two) times daily between meals. 04/02/17   Elgie Collard, PA-C  insulin aspart (NOVOLOG) 100 UNIT/ML injection Inject 0-24 Units into the skin every 4 (four) hours. 04/02/17   Elgie Collard, PA-C  insulin glargine (LANTUS) 100 UNIT/ML injection Inject 0.08 mLs (8 Units total) into the skin daily. 04/02/17   Elgie Collard, PA-C  ipratropium (ATROVENT) 0.02 % nebulizer solution Take 0.5 mg by nebulization 2 (two) times daily.    [provider]  levofloxacin (LEVAQUIN) 500 MG tablet Take 500 mg by mouth daily.    [provider]  lubiprostone (AMITIZA) 8 MCG capsule Take 8 mcg 2 (two) times daily as needed by mouth for constipation.     [provider]  montelukast (SINGULAIR) 10 MG tablet Take 10 mg by mouth at bedtime.    [provider]  Multiple Vitamins-Minerals (MULTIVITAMIN WITH MINERALS) tablet Take 1 tablet by mouth daily.    [provider]  Naphazoline HCl (CLEAR EYES OP) Place 2 drops 2 (two) times daily into both eyes.    [provider]  pantoprazole (PROTONIX) 40 MG tablet Take 1 tablet (40 mg total) by mouth 2 (two) times daily before a meal. 04/02/17   Elgie Collard, PA-C  phenazopyridine (PYRIDIUM) 200 MG tablet Take 200 mg by mouth 3 (three) times daily.    [provider]  sodium chloride (OCEAN) 0.65 % SOLN nasal spray Place 2 sprays 2 (two) times daily as needed into both nostrils for congestion.     [provider]  sucralfate (CARAFATE) 1 GM/10ML suspension Take 10 mLs  (1 g total) by mouth 3 (three) times daily with meals. 04/02/17   Elgie Collard, PA-C  traMADol (ULTRAM) 50 MG tablet Take 1 tablet (50 mg total) by mouth every 12 (twelve) hours as needed for moderate pain. 04/02/17   Elgie Collard, PA-C     Allergies:   No Known Allergies  Social History:   Social History   Socioeconomic History  . Marital status: Widowed    Spouse name: Not on file  . Number of children: 0  . Years of education: Not on file  . Highest education level: Not on file  Social Needs  . Financial resource strain: Not on file  . Food insecurity - worry: Not on file  . Food insecurity - inability: Not on file  . Transportation needs - medical: Not on file  . Transportation needs - non-medical: Not on file  Occupational History  . 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 Smoking status: Passive Smoke Exposure - Never Smoker  . Smokeless tobacco: Never Used  Substance and Sexual Activity  . Alcohol use: No    Frequency: Never  . Drug use: No  . Sexual activity: No  Other Topics Concern  . Not on file  Social History Narrative   Here with Uncle and [place].    Family History:   The patient's family history includes Cancer in her sister; Diabetes in her father; Heart disease in her father and mother; Hypertension in her father and mother; Kidney failure in her mother.    ROS:  Please see the history of present illness.  All other ROS reviewed and negative.     Physical Exam/Data:  There were no vitals filed for this visit. No intake or output data in the 24 hours ending 04/14/17 1411 There were no vitals filed for this visit. There is no height or weight on file to calculate BMI.    GEN: Chronically ill appearing, appears uncomfortable.  HEENT: normal x for poor dentition Neck: +JVD, carotid bruits, or masses Cardiac: RRR; soft  murmur at RUSB. No rubs, or gallops. 1+ bilateral LE edema  Respiratory: decreased air movement. + m ild wheezing and lung sound congested in all lung fields GI: soft, nontender, nondistended, + BS MS: no deformity or atrophy  Skin: warm and dry, no rash. Right groin site appears stable with no hematoma or ecchymosis. No signs of infection RFA has a knot over it and is tender to palpation. R DP 2+ Neuro:  Alert and Oriented x 3, Strength and sensation are intact Psych: euthymic mood, full affect    Relevant CV Studies: TAVR OPERATIVE NOTE   Date of Procedure:03/30/2017  Preoperative Diagnosis:Severe Aortic Stenosis   Postoperative Diagnosis:Same   Procedure:   Transcatheter Aortic Valve Replacement -OpenTransfemoral Approach Edwards Sapien 3 THV (size 79mm, model # 9600TFX, serial # [id number])  Co-Surgeons:Clarence H. Roxy Manns, MD and [name], MD  Anesthesiologist:Charlene Nyoka Cowden, MD  Echocardiographer:Dalton Aundra Dubin, MD  Pre-operative Echo Findings: ? Severe aortic stenosis ? Normalleft ventricular systolic function  Post-operative Echo Findings: ? traceparavalvular leak ? normalleft ventricular systolic function  _________________  Post operative echo:03/30/17  Study Conclusions - Left ventricle: The cavity size was normal. Wall thickness was normal. Systolic function was normal. The estimated ejection fraction was in the range of 60% to 65%. Wall motion was normal; there were no regional wall motion abnormalities. Doppler parameters are consistent with abnormal left ventricular relaxation (grade 1 diastolic dysfunction). - Aortic valve: A bioprosthesis was present. Valve area (VTI): 1.23 cm^2. Valve area (Vmax): 1.07 cm^2. Valve area (Vmean): 1.2 cm^2. - Mitral valve: Mildly calcified annulus. Moderately thickened, moderately  calcified leaflets . The findings are consistent with severe stenosis. Valve area by pressure half-time: 2.14 cm^2. Valve area by continuity equation (using LVOT flow): 1.03 cm^2. - Left atrium: The atrium was severely dilated. - Tricuspid valve: There was mild-moderate regurgitation.   Laboratory Data:  Chemistry Recent Labs  Lab 04/07/17 1501  NA 140  K 4.3  CL 108*  CO2 17*  GLUCOSE 331*  BUN 58*  CREATININE 2.16*  CALCIUM 9.4  GFRNONAA 23*  GFRAA 26*    No results for input(s): PROT, ALBUMIN, AST, ALT, ALKPHOS, BILITOT in the last 168 hours. Hematology Recent Labs  Lab 04/07/17 1501  WBC 4.9  RBC 3.58*  HGB 10.2*  HCT 31.9*  MCV 89  MCH 28.5  MCHC 32.0  RDW 14.8  PLT 116*   Cardiac EnzymesNo results for input(s): TROPONINI in the last 168 hours. No results for input(s): TROPIPOC in the last 168 hours.  BNP Recent Labs  Lab 04/07/17 1501  PROBNP 2,714*    DDimer No results for input(s): DDIMER in the last 168 hours.  Radiology/Studies:  No results found.  Assessment and Plan:   Acute on chronicdiastolic YQI:HKVQ in the outpatient clinic today and she has continued to become more volume overloaded despite starting lasix 40mg  daily last week. She has audible wheezing and worsening cough. Her weight is up at least 5 lbs since hospital discharge and she has evidence of volume overload on physical exam. She was sent to the ER from the office because there were no available beds. She Sonya need a BMET, BNP, CBC, CXR and potential groin US/CT upon admission.   Acute groin pain: she is complaining of 10/10 pain at her femoral cut down site. The wound appears to be healing well with no signs of infection, but we may need to get an ultrasound or CT to rule out underlying ischemia.   Severe AS s/p TAVR: doing okay. Valve looked good on post operative echo. Sonya see back in 1 month for follow up echo. If her blood counts are stable we can start EC ASA 81 mg  daily. ASA/Plavix have been held given recent GI bleed.   Acute GI bleed: EGD on 12/7 showed non-specific distal gastritisandoozing AVM vs. Cameron's erosions; treated with APC cautery.She was started on aPPI and Carafate and plavix and ASAwere heldper GI's recommendations.She had some BRBPR after discharge and I had follow up with her GI Dr in [place] arranged. EGD / colonscopy were perfromed last week. There was no active bleeding but diverticulitis, hemorrhoids and hiatal hernia were noted.  Anemia: check H/H when she gets to hospital   DMT2:continue home regimen. I Sonya order SSI while admitted   Signed, Angelena Form, PA-C  04/14/2017 2:11 PM    Patient seen and examined with Nell Range, PA-C. We discussed all aspects of the encounter. I agree with the assessment and plan as stated above.  Sonya Carpenter is a 71 y/o woman with DM2, CKD 4, COPD and AS. 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 Has been at [facility] in [place]. Seen in office today with respiratory distress due to worsening volume overload and severe R groin pain. On exam she is volume overload with severe cough. Mild wheeze. JVP to jaw. R groin is tender but no erythema or fluctuance. R DP pulse is 2+.   Sonya admit for IV diuresis. Low threshold to also cover for COPD flare. We have also ordered R groin u/s to further evaluate for PSA.    Case d/w Dr. Burt Knack.   Glori Bickers, MD  3:37 PM

## 2017-04-14 NOTE — Progress Notes (Addendum)
TCTS BRIEF PROGRESS NOTE   Ms Mciver is well known to me from recent TAVR.  Her right groin incision looks fine.  There is no sign of infection, hematoma, or lymphocele.  Her incision is healing.  Ultrasound negative.  Rexene Alberts, MD 04/14/2017 9:37 PM

## 2017-04-14 NOTE — Progress Notes (Signed)
*  PRELIMINARY RESULTS* Vascular Ultrasound: Pseudoaneurysm Evaluation has been completed.  Preliminary findings: No evidence of a right groin pseudoaneurysm on this exam.  Everrett Coombe 04/14/2017, 4:28 PM

## 2017-04-14 NOTE — Patient Instructions (Signed)
Please proceed immediately to [facility] ER for evaluation of CHF and acute groin pain - Dr. Burt Knack knows you are coming.

## 2017-04-14 NOTE — ED Notes (Signed)
Paged admitting dr to [facility]

## 2017-04-14 NOTE — ED Triage Notes (Addendum)
Pt sent by Dr. Burt Knack for SOB and R groin pain, denies CP, n/v. Pt reports valve replacement via R groin in early December. Pt appears pale Cough noted.

## 2017-04-14 NOTE — ED Notes (Signed)
Pt placed on bedpan

## 2017-04-14 NOTE — ED Notes (Signed)
Pt reports hx of recent cardiac stents, she reports shortness of breath from fluid on her heart. She states she's from a cardiac rehab center. A/O speaking in full sentences. NAD at this moment. No edema or swelling noted.

## 2017-04-15 ENCOUNTER — Inpatient Hospital Stay (HOSPITAL_COMMUNITY): Payer: Medicare HMO

## 2017-04-15 DIAGNOSIS — J441 Chronic obstructive pulmonary disease with (acute) exacerbation: Secondary | ICD-10-CM

## 2017-04-15 LAB — BASIC METABOLIC PANEL
Anion gap: 11 (ref 5–15)
BUN: 81 mg/dL — ABNORMAL HIGH (ref 6–20)
CHLORIDE: 105 mmol/L (ref 101–111)
CO2: 26 mmol/L (ref 22–32)
CREATININE: 2.66 mg/dL — AB (ref 0.44–1.00)
Calcium: 10.4 mg/dL — ABNORMAL HIGH (ref 8.9–10.3)
GFR calc non Af Amer: 17 mL/min — ABNORMAL LOW (ref >60)
GFR, EST AFRICAN AMERICAN: 20 mL/min — AB (ref >60)
Glucose, Bld: 122 mg/dL — ABNORMAL HIGH (ref 65–99)
POTASSIUM: 3.4 mmol/L — AB (ref 3.5–5.1)
Sodium: 142 mmol/L (ref 135–145)

## 2017-04-15 LAB — CBC WITH DIFFERENTIAL/PLATELET
Basophils Absolute: 0 10*3/uL (ref 0.0–0.1)
Basophils Relative: 1 %
EOS ABS: 0.6 10*3/uL (ref 0.0–0.7)
Eosinophils Relative: 14 %
HEMATOCRIT: 29.8 % — AB (ref 36.0–46.0)
HEMOGLOBIN: 9.7 g/dL — AB (ref 12.0–15.0)
LYMPHS ABS: 1 10*3/uL (ref 0.7–4.0)
LYMPHS PCT: 26 %
MCH: 29.2 pg (ref 26.0–34.0)
MCHC: 32.6 g/dL (ref 30.0–36.0)
MCV: 89.8 fL (ref 78.0–100.0)
MONOS PCT: 8 %
Monocytes Absolute: 0.3 10*3/uL (ref 0.1–1.0)
NEUTROS PCT: 51 %
Neutro Abs: 2.1 10*3/uL (ref 1.7–7.7)
Platelets: 90 10*3/uL — ABNORMAL LOW (ref 150–400)
RBC: 3.32 MIL/uL — AB (ref 3.87–5.11)
RDW: 14.6 % (ref 11.5–15.5)
WBC: 4 10*3/uL (ref 4.0–10.5)

## 2017-04-15 LAB — URINALYSIS, ROUTINE W REFLEX MICROSCOPIC
BILIRUBIN URINE: NEGATIVE
Glucose, UA: >=500 mg/dL — AB
Ketones, ur: NEGATIVE mg/dL
Leukocytes, UA: NEGATIVE
Nitrite: NEGATIVE
PH: 5 (ref 5.0–8.0)
Protein, ur: NEGATIVE mg/dL
SPECIFIC GRAVITY, URINE: 1.01 (ref 1.005–1.030)

## 2017-04-15 LAB — GLUCOSE, CAPILLARY
GLUCOSE-CAPILLARY: 241 mg/dL — AB (ref 65–99)
GLUCOSE-CAPILLARY: 275 mg/dL — AB (ref 65–99)
Glucose-Capillary: 127 mg/dL — ABNORMAL HIGH (ref 65–99)
Glucose-Capillary: 209 mg/dL — ABNORMAL HIGH (ref 65–99)
Glucose-Capillary: 199 mg/dL — ABNORMAL HIGH (ref 65–99)

## 2017-04-15 LAB — URINALYSIS, MICROSCOPIC (REFLEX): WBC, UA: NONE SEEN WBC/hpf (ref 0–5)

## 2017-04-15 MED ORDER — ACETAMINOPHEN 500 MG PO TABS
500.0000 mg | ORAL_TABLET | Freq: Four times a day (QID) | ORAL | Status: DC | PRN
  Administered 2017-04-20: 500 mg via ORAL
  Filled 2017-04-15: qty 1

## 2017-04-15 MED ORDER — METHYLPREDNISOLONE SODIUM SUCC 125 MG IJ SOLR
60.0000 mg | Freq: Every day | INTRAMUSCULAR | Status: DC
  Administered 2017-04-15 – 2017-04-17 (×3): 60 mg via INTRAVENOUS
  Filled 2017-04-15 (×4): qty 2

## 2017-04-15 MED ORDER — FUROSEMIDE 40 MG PO TABS
40.0000 mg | ORAL_TABLET | Freq: Every day | ORAL | Status: DC
  Administered 2017-04-15 – 2017-04-16 (×2): 40 mg via ORAL
  Filled 2017-04-15 (×2): qty 1

## 2017-04-15 MED ORDER — POTASSIUM CHLORIDE CRYS ER 20 MEQ PO TBCR
40.0000 meq | EXTENDED_RELEASE_TABLET | Freq: Once | ORAL | Status: AC
  Administered 2017-04-15: 40 meq via ORAL
  Filled 2017-04-15: qty 2

## 2017-04-15 NOTE — Progress Notes (Signed)
Inpatient Diabetes Program Recommendations  AACE/ADA: New Consensus Statement on Inpatient Glycemic Control (2015)  Target Ranges:  Prepandial:   less than 140 mg/dL      Peak postprandial:   less than 180 mg/dL (1-2 hours)      Critically ill patients:  140 - 180 mg/dL   Results for TYLOR, GAMBRILL (MRN [id number]) as of 04/15/2017 10:14  Ref. Range 04/14/2017 20:59 04/14/2017 21:40 04/14/2017 23:09 04/15/2017 01:46 04/15/2017 07:42  Glucose-Capillary Latest Ref Range: 65 - 99 mg/dL 475 (H) 487 (H) 332 (H) 241 (H) 127 (H)    Admit with: CHF  History: DM, CHF, CKD  Home DM Meds: Lantus 30 units QHS       Novolog 6 units BID  Current Insulin Orders: Novolog Sensitive Correction Scale/ SSI (0-9 units) TID AC + HS      Novolog 3 units TID      MD- Please consider starting Lantus 10 units daily (1/3 total home dose) to start  Was getting Lantus 10 units daily during last hospitalization    --Will follow patient during hospitalization--  Wyn Quaker RN, MSN, CDE Diabetes Coordinator Inpatient Glycemic Control Team Team Pager: [phone] (8a-5p)

## 2017-04-15 NOTE — Clinical Social Work Note (Signed)
Patient was discharged to [facility] in [place], [place] on 12/8. CSW spoke with admissions coordinator who stated that insurance had stopped paying for rehab and she was supposed to discharge home yesterday. Patient will need a new insurance authorization if SNF recommended by PT. She will also need an OT eval if SNF recommended and patient is agreeable. RNCM aware.  Sonya Carpenter, [place]

## 2017-04-15 NOTE — Care Management Note (Signed)
Case Management Note  Patient Details  Name: Sonya Carpenter MRN: [id number] Date of Birth: 1946-02-15  Subjective/Objective:    CHF               Action/Plan: Patient had TAVR 03/30/2017 and went to a SNF/ [facility] in [place], [place] for rehab; noted Physical Therapy recommendations is for [facility]; [facility] choice offered patient chose [facility] ( tele # [phone] fax # [phone]); DME - patient states that she has a walker and wheelchair at home; she also has [employer] system; Primary Care Provider: Michell Heinrich, DO; has private insurance with [facility] with prescription drug coverage. CM will continue to follow for progression of care. Awaiting for [place] orders with face to face to be faxed to [facility] agency.  Expected Discharge Date:     Possibly 04/16/2017             Expected Discharge Plan:  [place]  In-House Referral:  Clinical Social Work  Discharge planning Services  CM Consult  Choice offered to:  Patient   HH Arranged:  PT, Nurse's Aide, RN [facility] Agency: [facility] Status of Service:  In process, will continue to follow  [name] [phone] 04/15/2017, 2:58 PM

## 2017-04-15 NOTE — Evaluation (Signed)
Physical Therapy Evaluation Patient Details Name: Sonya Carpenter MRN: [id number] DOB: 1945-05-04 Today's Date: 04/15/2017   History of Present Illness  71 year old one-week history of shortness of breath, nonproductive cough and right groin pain. TAVR procedure earlier this month from a right femoral artery approach.  Clinical Impression  Pt admitted with above diagnosis. Pt currently with functional limitations due to the deficits listed below (see PT Problem List). Pt was able to ambulate with supervision with RW.  Pt should do well with home using RW at all times and HH f/u as recommended below.  Will follow acutely.  Pt will benefit from skilled PT to increase their independence and safety with mobility to allow discharge to the venue listed below.      Follow Up Recommendations Home health PT;Supervision - Intermittent(HHOT, HHaide and HHRN prn)    Equipment Recommendations  None recommended by PT    Recommendations for Other Services       Precautions / Restrictions Precautions Precautions: Fall Restrictions Weight Bearing Restrictions: No      Mobility  Bed Mobility               General bed mobility comments: in chair on arrival  Transfers Overall transfer level: Needs assistance Equipment used: Rolling walker (2 wheeled) Transfers: Sit to/from Stand Sit to Stand: Min guard;Supervision         General transfer comment: no physical assist.  Guard assist only.    Ambulation/Gait Ambulation/Gait assistance: Min guard;Supervision Ambulation Distance (Feet): 200 Feet Assistive device: Rolling walker (2 wheeled) Gait Pattern/deviations: Step-through pattern;Decreased stride length;Trunk flexed Gait velocity: decr Gait velocity interpretation: Below normal speed for age/gender General Gait Details: Guard assist but did not have to assist. No loss of balance.  Needed no cues as pt safe with use of RW.   Amb on RA with VSS.  Walked into bathroom, urinated and  cleaned herself.    Stairs            Wheelchair Mobility    Modified Rankin (Stroke Patients Only)       Balance Overall balance assessment: Needs assistance Sitting-balance support: Feet supported;No upper extremity supported Sitting balance-Leahy Scale: Good     Standing balance support: Bilateral upper extremity supported;During functional activity Standing balance-Leahy Scale: Poor Standing balance comment: Can stand statically with RW with good balance and no need for external support.                               Pertinent Vitals/Pain Pain Assessment: Faces Faces Pain Scale: Hurts little more Pain Location: incision Pain Descriptors / Indicators: Sore Pain Intervention(s): Limited activity within patient's tolerance;Monitored during session;Repositioned  VSS  Home Living Family/patient expects to be discharged to:: Private residence Living Arrangements: Alone Available Help at Discharge: Family;Available PRN/intermittently Type of Home: House Home Access: Ramped entrance     Home Layout: One level Home Equipment: Walker - 2 wheels;Bedside commode;Cane - single point;Adaptive equipment;Wheelchair - manual      Prior Function Level of Independence: Needs assistance   Gait / Transfers Assistance Needed: States she used RW at home.    ADL's / Homemaking Assistance Needed: Sponge bathing per pt, used AE for bathing and dressing        Hand Dominance   Dominant Hand: Right    Extremity/Trunk Assessment   Upper Extremity Assessment Upper Extremity Assessment: Defer to OT evaluation    Lower Extremity Assessment Lower Extremity Assessment: Generalized  weakness    Cervical / Trunk Assessment Cervical / Trunk Assessment: Kyphotic  Communication   Communication: HOH  Cognition Arousal/Alertness: Awake/alert Behavior During Therapy: WFL for tasks assessed/performed Overall Cognitive Status: Within Functional Limits for tasks  assessed                                        General Comments      Exercises General Exercises - Lower Extremity Ankle Circles/Pumps: AROM;Both;5 reps;Seated Long Arc Quad: AROM;Both;10 reps;Seated   Assessment/Plan    PT Assessment Patient needs continued PT services  PT Problem List Decreased strength;Decreased activity tolerance;Decreased balance;Decreased mobility       PT Treatment Interventions DME instruction;Gait training;Functional mobility training;Therapeutic activities;Therapeutic exercise;Balance training;Patient/family education    PT Goals (Current goals can be found in the Care Plan section)  Acute Rehab PT Goals 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 : A Little Difficulty sitting down on and standing up from a chair with arms (e.g., wheelchair, bedside commode, etc,.)?: None Help needed moving to and from a bed to chair (including a wheelchair)?: None Help needed walking in hospital room?: A Little Help needed climbing 3-5 steps with a railing? : Total 6 Click Score: 18    End of Session Equipment Utilized During Treatment: Gait belt Activity Tolerance: Patient tolerated treatment well Patient left: in chair;with call bell/phone within reach Nurse Communication: Mobility status PT Visit Diagnosis: Unsteadiness on feet (R26.81);Other abnormalities of gait and mobility (R26.89);Muscle weakness (generalized) (M62.81)    Time: 1497-0263 PT Time Calculation (min) (ACUTE  ONLY): 26 min   Charges:   PT Evaluation $PT Eval Moderate Complexity: 1 Mod PT Treatments $Gait Training: 8-22 mins   PT G Codes:        Jinna Weinman,PT Acute Rehabilitation [phone] (pager)   Denice Paradise 04/15/2017, 1:42 PM

## 2017-04-15 NOTE — Progress Notes (Signed)
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 In great spirits considering how sick she has been. Conard Novak, Chaplain   04/15/17 1400  Clinical Encounter Type  Visited With Patient  Visit Type Initial;Spiritual support;Other (Comment) (consult for prayer)  Referral From Nurse  Consult/Referral To Chaplain  Spiritual Encounters  Spiritual Needs Prayer;Emotional  Stress Factors  Patient Stress Factors Other (Comment) (patient has great support system.)

## 2017-04-15 NOTE — Progress Notes (Addendum)
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 [place] r groin was negative. Diuresing with IV lasix. Weight down 4 pounds.   Denies SOB. + cough and wheezing. Very weak     Objective:   Weight Range: 93 lb 0.6 oz (42.2 kg) Body mass index is 19.44 kg/m.   Vital Signs:   Temp:  [97.8 F (36.6 C)-98.4 F (36.9 C)] 98 F (36.7 C) (12/21 0538) Pulse Rate:  [75-97] 75 (12/21 0538) Resp:  [17-27] 18 (12/21 0538) BP: (108-156)/(45-98) 108/52 (12/21 0538) SpO2:  [88 %-100 %] 94 % (12/21 0538) Weight:  [93 lb 0.6 oz (42.2 kg)-97 lb 3.6 oz (44.1 kg)] 93 lb 0.6 oz (42.2 kg) (12/21 0538) Last BM Date: 04/13/17  Weight change: Filed Weights   04/14/17 2228 04/15/17 0538  Weight: 97 lb 3.6 oz (44.1 kg) 93 lb 0.6 oz (42.2 kg)    Intake/Output:   Intake/Output Summary (Last 24 hours) at 04/15/2017 0751 Last data filed at 04/15/2017 0657 Gross per 24 hour  Intake 240 ml  Output 900 ml  Net -660 ml      Physical Exam    General:  Thin. Appears chronically ill. Sitting in the chair. + cough HEENT: Normal Neck: Supple. JVP 5-6  . Carotids 2+ bilat; no bruits. No lymphadenopathy or thyromegaly appreciated. Cor: PMI nondisplaced. Regular rate & rhythm. No rubs, gallops or murmurs. Lungs: EW on room air.  Abdomen: Soft, nontender, nondistended. No hepatosplenomegaly. No bruits or masses. Good bowel sounds. Extremities: No cyanosis, clubbing, rash, edema. R groin tender but no bruit  No exudate Neuro: Alert & orientedx3, cranial nerves grossly intact. moves all 4 extremities w/o difficulty. Affect pleasant   Telemetry   NSR 80s personally checked.   EKG    N/A   Labs    CBC Recent Labs    04/14/17 1420  WBC 3.7*  HGB 9.5*  HCT 29.8*  MCV 90.9  PLT 83*   Basic Metabolic Panel Recent Labs    04/14/17 1420  NA 135  K 4.2  CL 103  CO2 21*    GLUCOSE 586*  BUN 80*  CREATININE 2.66*  CALCIUM 9.7   Liver Function Tests No results for input(s): AST, ALT, ALKPHOS, BILITOT, PROT, ALBUMIN in the last 72 hours. No results for input(s): LIPASE, AMYLASE in the last 72 hours. Cardiac Enzymes No results for input(s): CKTOTAL, CKMB, CKMBINDEX, TROPONINI in the last 72 hours.  BNP: BNP (last 3 results) Recent Labs    03/18/17 1240  BNP 182.0*    ProBNP (last 3 results) Recent Labs    04/07/17 1501  PROBNP 2,714*     D-Dimer No results for input(s): DDIMER in the last 72 hours. Hemoglobin A1C No results for input(s): HGBA1C in the last 72 hours. Fasting Lipid Panel No results for input(s): CHOL, HDL, LDLCALC, TRIG, CHOLHDL, LDLDIRECT in the last 72 hours. Thyroid Function Tests No results for input(s): TSH, T4TOTAL, T3FREE, THYROIDAB in the last 72 hours.  Invalid input(s): FREET3  Other results:   Imaging    Dg Chest 2 View  Result Date: 04/14/2017 CLINICAL DATA:  71 year old one-week history of shortness of breath, nonproductive cough and right groin pain. TAVR procedure earlier this month from a right femoral artery approach. EXAM: CHEST  2 VIEW COMPARISON:  03/29/2017, 03/25/2017 and earlier, including CT chest 03/01/2017.  FINDINGS: AP erect and lateral images were obtained. TAVR, with the valve struts in the expected orientation. Cardiac silhouette mildly enlarged for technique, unchanged. Thoracic aorta mildly atherosclerotic, unchanged. Hilar and mediastinal contours otherwise unremarkable. Linear scarring in the lingula and the right middle lobe, unchanged. Lungs otherwise clear. Mild pulmonary venous hypertension without overt edema. No pleural effusions. Osseous demineralization, exaggeration of the usual thoracic kyphosis as noted previously. IMPRESSION: 1.  No acute cardiopulmonary disease. 2. Stable mild cardiomegaly without evidence of pulmonary edema. 3. Stable scarring in the lingula and right middle  lobe. 4.  Aortic Atherosclerosis (ICD10-170.0) Electronically Signed   By: Evangeline Dakin M.D.   On: 04/14/2017 15:58      Medications:     Scheduled Medications: . feeding supplement (ENSURE ENLIVE)  237 mL Oral BID BM  . furosemide  80 mg Intravenous BID  . heparin  5,000 Units Subcutaneous Q8H  . insulin aspart  0-5 Units Subcutaneous QHS  . insulin aspart  0-9 Units Subcutaneous TID WC  . insulin aspart  3 Units Subcutaneous TID WC  . ipratropium  0.5 mg Nebulization BID  . loratadine  10 mg Oral Daily  . mometasone-formoterol  2 puff Inhalation BID  . montelukast  10 mg Oral QHS  . pantoprazole  40 mg Oral BID AC  . sodium chloride flush  3 mL Intravenous Q12H  . sucralfate  1 g Oral TID WC     Infusions: . sodium chloride       PRN Medications:  sodium chloride, acetaminophen, albuterol, docusate sodium, guaiFENesin-codeine, ipratropium-albuterol, lubiprostone, ondansetron (ZOFRAN) IV, sodium chloride, sodium chloride flush, traMADol    Patient Profile   Sonya Carpenter is a 71 y/o woman with DM2, CKD 4, COPD and AS. 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 Has been at [facility] in [place]. Seen in office today with respiratory distress due to worsening volume overload and severe R groin pain.   Assessment/Plan   1. A/C Diastolic Heart Failure  Volume status improved. Hold IV lasix. Start 40 mg po lasix daily.  Creatinine unchanged.  Supp K.  2. R groin pain- Ultrasound negative. Dr Roxy Manns evaluated. Pain improved.   3. 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 H/O GI Bleed  5. Anemia  Hgb 9.7 Stable.   6. DMII Continue sliding scale.   7. AECOPD On atrovent bid.On duonebs.  Start 60 mg steroids IV.     Medication concerns reviewed with patient and pharmacy team. Barriers identified: No Length of Stay: 1  Amy Clegg, NP  04/15/2017, 7:51 AM  Advanced Heart Failure Team Pager [phone] (M-F; 7a - 4p)  Please contact [facility] for night-coverage after hours (4p -7a ) and weekends on  [email or web address]  Patient seen and examined with Darrick Grinder, NP. We discussed all aspects of the encounter. I agree with the assessment and plan as stated above.   Has diuresed well. Volume status improved. Still quite SOB with expiratory wheezing on exam. Will treat for COPD flare. R groin u/s reviewed personally. No PSA. D/w Dr. Roxy Manns. Wil treat for COPD and then will ned to return to SNF.   Glori Bickers, MD  5:09 PM

## 2017-04-16 DIAGNOSIS — E875 Hyperkalemia: Secondary | ICD-10-CM

## 2017-04-16 DIAGNOSIS — N179 Acute kidney failure, unspecified: Secondary | ICD-10-CM

## 2017-04-16 LAB — BASIC METABOLIC PANEL
ANION GAP: 12 (ref 5–15)
Anion gap: 9 (ref 5–15)
BUN: 97 mg/dL — AB (ref 6–20)
BUN: 101 mg/dL — AB (ref 6–20)
CHLORIDE: 104 mmol/L (ref 101–111)
CHLORIDE: 103 mmol/L (ref 101–111)
CO2: 23 mmol/L (ref 22–32)
CO2: 21 mmol/L — AB (ref 22–32)
Calcium: 9.8 mg/dL (ref 8.9–10.3)
Calcium: 9.9 mg/dL (ref 8.9–10.3)
Creatinine, Ser: 2.99 mg/dL — ABNORMAL HIGH (ref 0.44–1.00)
Creatinine, Ser: 3.03 mg/dL — ABNORMAL HIGH (ref 0.44–1.00)
GFR calc Af Amer: 17 mL/min — ABNORMAL LOW (ref >60)
GFR calc Af Amer: 17 mL/min — ABNORMAL LOW (ref >60)
GFR calc non Af Amer: 15 mL/min — ABNORMAL LOW (ref >60)
GFR calc non Af Amer: 15 mL/min — ABNORMAL LOW (ref >60)
GLUCOSE: 465 mg/dL — AB (ref 65–99)
GLUCOSE: 340 mg/dL — AB (ref 65–99)
POTASSIUM: 5.8 mmol/L — AB (ref 3.5–5.1)
POTASSIUM: 4.8 mmol/L (ref 3.5–5.1)
Sodium: 136 mmol/L (ref 135–145)
Sodium: 136 mmol/L (ref 135–145)

## 2017-04-16 LAB — CBC
HEMATOCRIT: 30.2 % — AB (ref 36.0–46.0)
HEMOGLOBIN: 10 g/dL — AB (ref 12.0–15.0)
MCH: 29.4 pg (ref 26.0–34.0)
MCHC: 33.1 g/dL (ref 30.0–36.0)
MCV: 88.8 fL (ref 78.0–100.0)
Platelets: 88 10*3/uL — ABNORMAL LOW (ref 150–400)
RBC: 3.4 MIL/uL — AB (ref 3.87–5.11)
RDW: 14.2 % (ref 11.5–15.5)
WBC: 5.1 10*3/uL (ref 4.0–10.5)

## 2017-04-16 LAB — GLUCOSE, CAPILLARY
GLUCOSE-CAPILLARY: 234 mg/dL — AB (ref 65–99)
Glucose-Capillary: 476 mg/dL — ABNORMAL HIGH (ref 65–99)
Glucose-Capillary: 485 mg/dL — ABNORMAL HIGH (ref 65–99)
Glucose-Capillary: 291 mg/dL — ABNORMAL HIGH (ref 65–99)

## 2017-04-16 MED ORDER — INSULIN ASPART 100 UNIT/ML ~~LOC~~ SOLN
0.0000 [IU] | Freq: Every day | SUBCUTANEOUS | Status: DC
  Administered 2017-04-16: 2 [IU] via SUBCUTANEOUS
  Administered 2017-04-17: 4 [IU] via SUBCUTANEOUS
  Administered 2017-04-18: 2 [IU] via SUBCUTANEOUS
  Administered 2017-04-20: 4 [IU] via SUBCUTANEOUS

## 2017-04-16 MED ORDER — INSULIN ASPART 100 UNIT/ML ~~LOC~~ SOLN
15.0000 [IU] | Freq: Once | SUBCUTANEOUS | Status: AC
  Administered 2017-04-16: 15 [IU] via SUBCUTANEOUS

## 2017-04-16 MED ORDER — INSULIN ASPART 100 UNIT/ML ~~LOC~~ SOLN
15.0000 [IU] | Freq: Once | SUBCUTANEOUS | Status: AC
Start: 2017-04-16 — End: 2017-04-16
  Administered 2017-04-16: 15 [IU] via SUBCUTANEOUS

## 2017-04-16 MED ORDER — INSULIN ASPART 100 UNIT/ML ~~LOC~~ SOLN
0.0000 [IU] | Freq: Three times a day (TID) | SUBCUTANEOUS | Status: DC
  Administered 2017-04-16: 11 [IU] via SUBCUTANEOUS
  Administered 2017-04-17: 0 [IU] via SUBCUTANEOUS
  Administered 2017-04-17: 20 [IU] via SUBCUTANEOUS
  Administered 2017-04-17 – 2017-04-18 (×2): 4 [IU] via SUBCUTANEOUS
  Administered 2017-04-18: 3 [IU] via SUBCUTANEOUS
  Administered 2017-04-18: 11 [IU] via SUBCUTANEOUS
  Administered 2017-04-19: 7 [IU] via SUBCUTANEOUS
  Administered 2017-04-19: 20 [IU] via SUBCUTANEOUS
  Administered 2017-04-19 – 2017-04-20 (×3): 11 [IU] via SUBCUTANEOUS
  Administered 2017-04-20 – 2017-04-21 (×2): 3 [IU] via SUBCUTANEOUS
  Administered 2017-04-21: 20 [IU] via SUBCUTANEOUS

## 2017-04-16 MED ORDER — INSULIN GLARGINE 100 UNIT/ML ~~LOC~~ SOLN
15.0000 [IU] | Freq: Every day | SUBCUTANEOUS | Status: DC
  Administered 2017-04-16: 15 [IU] via SUBCUTANEOUS
  Filled 2017-04-16: qty 0.15

## 2017-04-16 MED ORDER — INSULIN GLARGINE 100 UNIT/ML ~~LOC~~ SOLN
20.0000 [IU] | Freq: Every day | SUBCUTANEOUS | Status: DC
  Filled 2017-04-16: qty 0.2

## 2017-04-16 MED ORDER — SODIUM CHLORIDE 0.9 % IV BOLUS (SEPSIS)
250.0000 mL | Freq: Once | INTRAVENOUS | Status: AC
  Administered 2017-04-16: 250 mL via INTRAVENOUS

## 2017-04-16 MED ORDER — INSULIN GLARGINE 100 UNIT/ML ~~LOC~~ SOLN
30.0000 [IU] | Freq: Every day | SUBCUTANEOUS | Status: DC
  Administered 2017-04-17: 30 [IU] via SUBCUTANEOUS
  Filled 2017-04-16: qty 0.3

## 2017-04-16 NOTE — Progress Notes (Signed)
RN called regarding a blood sugar of 457. He was taking Lantus 30 units nightly prior to admission, not on this now. We will give 15 units now and reorder Lantus at 15 units nightly.  Rosaria Ferries, PA-C 04/16/2017 8:46 AM Beeper [phone]

## 2017-04-16 NOTE — Progress Notes (Signed)
OT Cancellation Note  Patient Details Name: Sonya Carpenter MRN: [id number] DOB: 09/11/1945   Cancelled Treatment:    Reason Eval/Treat Not Completed: Medical issues which prohibited therapy.  Pt coughing, and unable to stop.  02 sats 88-91% on RA HR 103.   RN notified.   Will try back  [facility], OTR/L [phone]   Lucille Passy M 04/16/2017, 10:47 AM

## 2017-04-16 NOTE — Progress Notes (Addendum)
Inpatient Diabetes Program Recommendations  AACE/ADA: New Consensus Statement on Inpatient Glycemic Control (2015)  Target Ranges:  Prepandial:   less than 140 mg/dL      Peak postprandial:   less than 180 mg/dL (1-2 hours)      Critically ill patients:  140 - 180 mg/dL   Results for BRITT, THEARD (MRN [id number]) as of 04/16/2017 21:17  Ref. Range 04/16/2017 07:45 04/16/2017 11:23 04/16/2017 16:39 04/16/2017 20:58  Glucose-Capillary Latest Ref Range: 65 - 99 mg/dL 476 (H) 485 (H) 291 (H) 234 (H)     Home DM Meds: Lantus 30 units QHS                             Novolog 6 units BID  Current Insulin Orders: Novolog Resistant Correction Scale/ SSI (0-20 units) TID AC + HS                                       Novolog 3 units TID    Lantus 30 units daily         MD- Note patient received 15 units Lantus this AM and Novolog SSI increased to Resistant scale.  Also note Lantus dose increased to 30 units daily later today.  Will get increased dose tomorrow AM (12/23).   Agree with Insulin adjustments made.  Will follow and assist based on CBG levels.       --Will follow patient during hospitalization--  Wyn Quaker RN, MSN, CDE Diabetes Coordinator Inpatient Glycemic Control Team Team Pager: [phone] (8a-5p)

## 2017-04-16 NOTE — Evaluation (Addendum)
Occupational Therapy Evaluation Patient Details Name: Sonya Carpenter MRN: [id number] DOB: Sep 21, 1945 Today's Date: 04/16/2017    History of Present Illness 71 year old one-week history of shortness of breath, nonproductive cough and right groin pain. TAVR procedure earlier this month from a right femoral artery approach.   Clinical Impression   Pt admitted with above. She demonstrates the below listed deficits and will benefit from continued OT to maximize safety and independence with BADLs.  Pt presents to OT with generalized weakness, decreased activity tolerance, impaired balance, decreased safety awareness, and pain.  She currently requires min - max A for ADLs, and min A for short distance ambulation.  Pt requires an excessive amount of time to complete simple tasks, and is unsteady on her feet - fatigues quickly.   Comparing her performance today to PTs eval yesterday, pt is requiring significantly more assistance, and not tolerating as much activity (she ambulated 200 ft with PT yesterday and min guard assist, and  today she ambulated 22' with min A with OT, and required 22 mins to move from supine >EOB>ambulated to BR door>back to bed.   She lives alone, and, per her report has only intermittent assistance from sister.  Pt is not currently functioning at a level that she can safely manage herself independtly  over a 24 hour period of time, and is at high risk for falls/injury, decompensation, and malnutrition, and readmission.  Feel she needs SNF level rehab unless, performance significantly improves.  Will follow acutely.         Follow Up Recommendations  SNF    Equipment Recommendations  None recommended by OT    Recommendations for Other Services       Precautions / Restrictions Precautions Precautions: Fall      Mobility Bed Mobility Overal bed mobility: Needs Assistance Bed Mobility: Supine to Sit;Sit to Supine     Supine to sit: HOB elevated;Min assist Sit to supine:  Min assist;HOB elevated   General bed mobility comments: required an extensive amount of time to complete.  Assist for LEs   Transfers Overall transfer level: Needs assistance Equipment used: Rolling walker (2 wheeled) Transfers: Sit to/from [facility] Sit to Stand: Min assist Stand pivot transfers: Min assist       General transfer comment: Pt tremulous and unsteady.  Assist for balance     Balance Overall balance assessment: Needs assistance Sitting-balance support: Feet supported;No upper extremity supported Sitting balance-Leahy Scale: Fair     Standing balance support: Bilateral upper extremity supported;During functional activity Standing balance-Leahy Scale: Poor Standing balance comment: requires UE support                            ADL either performed or assessed with clinical judgement   ADL Overall ADL's : Needs assistance/impaired Eating/Feeding: Independent;Bed level   Grooming: Wash/dry hands;Wash/dry face;Oral care;Brushing hair;Supervision/safety;Sitting   Upper Body Bathing: Minimal assistance;Sitting   Lower Body Bathing: Maximal assistance;Sit to/from stand   Upper Body Dressing : Minimal assistance;Sitting   Lower Body Dressing: Maximal assistance;Sit to/from stand Lower Body Dressing Details (indicate cue type and reason): Pt able to reach forward to mid calf to access Rt foot - cites pain Rt groin - requires max cues/encouragement to attempt  Toilet Transfer: Minimal assistance;Ambulation;BSC;RW   Toileting- Clothing Manipulation and Hygiene: Moderate assistance;Sit to/from stand       Functional mobility during ADLs: Minimal assistance;Rolling walker General ADL Comments: requires an excessive amount of time  to complete activiites      Vision         Perception     Praxis      Pertinent Vitals/Pain Pain Assessment: Faces Faces Pain Scale: Hurts little more Pain Location: incision - Rt groin  Pain  Descriptors / Indicators: Sore;Grimacing Pain Intervention(s): Monitored during session;Limited activity within patient's tolerance;Repositioned     Hand Dominance Right   Extremity/Trunk Assessment Upper Extremity Assessment Upper Extremity Assessment: Generalized weakness(bil. UEs tremulous )   Lower Extremity Assessment Lower Extremity Assessment: Defer to PT evaluation   Cervical / Trunk Assessment Cervical / Trunk Assessment: Kyphotic   Communication Communication Communication: HOH   Cognition Arousal/Alertness: Awake/alert Behavior During Therapy: WFL for tasks assessed/performed Overall Cognitive Status: No family/caregiver present to determine baseline cognitive functioning                                 General Comments: decreased safety awareness    General Comments  Pt poorly motivated - requires max encouragement.  SHe states she is weak and tired     Exercises     Shoulder Instructions      Home Living Family/patient expects to be discharged to:: Private residence Living Arrangements: Alone Available Help at Discharge: Family;Available PRN/intermittently Type of Home: House Home Access: Ramped entrance     Home Layout: One level     Bathroom Shower/Tub: Teacher, early years/pre: Standard     Home Equipment: Environmental consultant - 2 wheels;Bedside commode;Cane - single point;Adaptive equipment;Wheelchair - Diplomatic Services operational officer Equipment: Reacher;Sock aid;Long-handled sponge        Prior Functioning/Environment Level of Independence: Needs assistance  Gait / Transfers Assistance Needed: States she used RW at home.   ADL's / Homemaking Assistance Needed: Sponge bathing per pt, used AE for bathing and dressing   Comments: amb with rolling walker, avoided getting into tub due to fear of falling, used AE for LB bathing and dressing        OT Problem List: Decreased strength;Decreased activity tolerance;Impaired balance (sitting and/or  standing);Decreased safety awareness;Decreased knowledge of use of DME or AE;Pain      OT Treatment/Interventions:      OT Goals(Current goals can be found in the care plan section) Acute Rehab OT Goals Patient Stated Goal: to return to rehab  OT Goal Formulation: With patient Time For Goal Achievement: 04/30/17 Potential to Achieve Goals: Good ADL Goals Pt Will Perform Grooming: with min guard assist;standing Pt Will Perform Upper Body Dressing: with set-up Pt Will Perform Lower Body Dressing: with min guard assist;with adaptive equipment;sit to/from stand Pt Will Transfer to Toilet: with min guard assist;ambulating;regular height toilet;grab bars Pt Will Perform Toileting - Clothing Manipulation and hygiene: with min guard assist;sit to/from stand  OT Frequency: Min 2X/week   Barriers to D/C: Decreased caregiver support          Co-evaluation              AM-PAC PT "6 Clicks" Daily Activity     Outcome Measure Help from another person eating meals?: None Help from another person taking care of personal grooming?: A Little Help from another person toileting, which includes using toliet, bedpan, or urinal?: A Lot Help from another person bathing (including washing, rinsing, drying)?: A Lot Help from another person to put on and taking off regular upper body clothing?: A Little Help from another person to put on and taking off regular  lower body clothing?: A Lot 6 Click Score: 16   End of Session Equipment Utilized During Treatment: Surveyor, mining Communication: Mobility status  Activity Tolerance: Patient limited by fatigue Patient left: in bed;with call bell/phone within reach;with bed alarm set  OT Visit Diagnosis: Unsteadiness on feet (R26.81)                Time: 0962-8366 OT Time Calculation (min): 22 min Charges:  OT General Charges $OT Visit: 1 Visit OT Evaluation $OT Eval Moderate Complexity: 1 Mod G-Codes:     [facility],  OTR/L [phone]   Ladonna Snide, Macklen Wilhoite M 04/16/2017, 2:28 PM

## 2017-04-16 NOTE — Progress Notes (Signed)
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 [place] r groin was negative.   Being treated for diastolic HF and AECOPD. Now on IV steroids. Now on po lasix. Weight recorded as up 5 pounds. Poor urine output. + severe cough. CXR (viewed personally) No edema or infiltrate.   CBGs high in 400-500 range. Creatinine 2.7-> 3.0. K 5.8   Objective:   Weight Range: 44.7 kg (98 lb 8.7 oz) Body mass index is 20.6 kg/m.   Vital Signs:   Temp:  [98 F (36.7 C)-98.4 F (36.9 C)] 98 F (36.7 C) (12/22 0535) Pulse Rate:  [77-87] 87 (12/22 1006) Resp:  [18-20] 18 (12/22 1006) BP: (107-125)/(49-57) 125/57 (12/22 0535) SpO2:  [90 %-92 %] 91 % (12/22 1006) Weight:  [44.7 kg (98 lb 8.7 oz)] 44.7 kg (98 lb 8.7 oz) (12/22 0535) Last BM Date: 04/13/17  Weight change: Filed Weights   04/14/17 2228 04/15/17 0538 04/16/17 0535  Weight: 44.1 kg (97 lb 3.6 oz) 42.2 kg (93 lb 0.6 oz) 44.7 kg (98 lb 8.7 oz)    Intake/Output:   Intake/Output Summary (Last 24 hours) at 04/16/2017 1240 Last data filed at 04/16/2017 0939 Gross per 24 hour  Intake 840 ml  Output 750 ml  Net 90 ml      Physical Exam    General:  Thin. Chronically ill appearing. Sitting up in bed  + cough HEENT: Normal poor dentition Neck: Supple. JVP flat . Carotids 2+ bilat; no bruits. No lymphadenopathy or thyromegaly appreciated. Cor: PMI nondisplaced.RRR no m/r/g Lungs: decreased air movement throughout minimal wheeze + cough Abdomen: Soft, nontender, nondistended. No hepatosplenomegaly. No bruits or masses. Good bowel sounds. Extremities: muscles wasting no cyanosis, clubbing, rash, edema. R groin sore but no bruit  Neuro: alert & oriented x 3, cranial nerves grossly intact. moves all 4 extremities w/o difficulty. Affect pleasant   Telemetry   NSR 80-100s personally checked.   EKG    N/A   Labs     CBC Recent Labs    04/14/17 1420 04/15/17 0627  WBC 3.7* 4.0  NEUTROABS  --  2.1  HGB 9.5* 9.7*  HCT 29.8* 29.8*  MCV 90.9 89.8  PLT 83* 90*   Basic Metabolic Panel Recent Labs    04/15/17 0627 04/16/17 0514  NA 142 136  K 3.4* 5.8*  CL 105 104  CO2 26 23  GLUCOSE 122* 465*  BUN 81* 97*  CREATININE 2.66* 2.99*  CALCIUM 10.4* 9.8   Liver Function Tests No results for input(s): AST, ALT, ALKPHOS, BILITOT, PROT, ALBUMIN in the last 72 hours. No results for input(s): LIPASE, AMYLASE in the last 72 hours. Cardiac Enzymes No results for input(s): CKTOTAL, CKMB, CKMBINDEX, TROPONINI in the last 72 hours.  BNP: BNP (last 3 results) Recent Labs    03/18/17 1240  BNP 182.0*    ProBNP (last 3 results) Recent Labs    04/07/17 1501  PROBNP 2,714*     D-Dimer No results for input(s): DDIMER in the last 72 hours. Hemoglobin A1C No results for input(s): HGBA1C in the last 72 hours. Fasting Lipid Panel No results for input(s): CHOL, HDL, LDLCALC, TRIG, CHOLHDL, LDLDIRECT in the last 72 hours. Thyroid Function Tests No results for input(s): TSH, T4TOTAL, T3FREE, THYROIDAB in the last 72 hours.  Invalid input(s): FREET3  Other results:   Imaging  No results found.   Medications:     Scheduled Medications: . feeding supplement (ENSURE ENLIVE)  237 mL Oral BID BM  . furosemide  40 mg Oral Daily  . heparin  5,000 Units Subcutaneous Q8H  . insulin aspart  0-5 Units Subcutaneous QHS  . insulin aspart  0-9 Units Subcutaneous TID WC  . insulin aspart  3 Units Subcutaneous TID WC  . insulin glargine  15 Units Subcutaneous Daily  . ipratropium  0.5 mg Nebulization BID  . loratadine  10 mg Oral Daily  . methylPREDNISolone (SOLU-MEDROL) injection  60 mg Intravenous Daily  . mometasone-formoterol  2 puff Inhalation BID  . montelukast  10 mg Oral QHS  . pantoprazole  40 mg Oral BID AC  . sodium chloride flush  3 mL Intravenous Q12H  . sucralfate  1 g Oral  TID WC    Infusions: . sodium chloride      PRN Medications: sodium chloride, acetaminophen, docusate sodium, guaiFENesin-codeine, ipratropium-albuterol, lubiprostone, ondansetron (ZOFRAN) IV, sodium chloride, sodium chloride flush, traMADol    Patient Profile   Sonya Carpenter is a 71 y/o woman with DM2, CKD 4, COPD and AS. 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 Has been at [facility] in [place]. Seen in office today with respiratory distress due to worsening volume overload and severe R groin pain.   Assessment/Plan   1. A/C Diastolic Heart Failure  -Volume status improved with IV diuresis. Now appears dry with AKI and hyperkalemia. Will hold lasix  2. Acute on chronic hypoxic respiratory failure due to AECOPD -Continue steroids. Will start doxycycline -Continue nebs  3. R groin pain- Ultrasound negative. Dr Roxy Manns evaluated. Pain improved.   4. 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 H/O GI Bleed - hgb stable   6. DMII Sugars markedly elevated with steroids. Will increase insulin. Appreciate recs from DM coordinator  7. AKI - hold diuretics. Give 250 NS back  8. Hyperkalemia  -stop lasix -give IVF -repeat BMET today. Can give kayexalate as needed  9. Severe deconditioning - PT seeing. Will need to go back to SNF at d/c  Medication concerns reviewed with patient and pharmacy team. Barriers identified: No Length of Stay: 2  Glori Bickers, MD  04/16/2017, 12:40 PM  Advanced Heart Failure Team Pager [phone] (M-F; 7a - 4p)  Please contact [name] for night-coverage after hours (4p -7a ) and weekends on [email or web address]

## 2017-04-17 LAB — GLUCOSE, CAPILLARY
GLUCOSE-CAPILLARY: 176 mg/dL — AB (ref 65–99)
GLUCOSE-CAPILLARY: 334 mg/dL — AB (ref 65–99)
Glucose-Capillary: 425 mg/dL — ABNORMAL HIGH (ref 65–99)

## 2017-04-17 LAB — BASIC METABOLIC PANEL
ANION GAP: 12 (ref 5–15)
BUN: 105 mg/dL — ABNORMAL HIGH (ref 6–20)
CHLORIDE: 104 mmol/L (ref 101–111)
CO2: 21 mmol/L — ABNORMAL LOW (ref 22–32)
Calcium: 10 mg/dL (ref 8.9–10.3)
Creatinine, Ser: 2.84 mg/dL — ABNORMAL HIGH (ref 0.44–1.00)
GFR calc Af Amer: 18 mL/min — ABNORMAL LOW (ref >60)
GFR, EST NON AFRICAN AMERICAN: 16 mL/min — AB (ref >60)
GLUCOSE: 160 mg/dL — AB (ref 65–99)
POTASSIUM: 4.9 mmol/L (ref 3.5–5.1)
SODIUM: 137 mmol/L (ref 135–145)

## 2017-04-17 MED ORDER — PREDNISONE 20 MG PO TABS: 20.0000 mg | ORAL_TABLET | Freq: Every day | ORAL | Status: DC

## 2017-04-17 MED ORDER — PREDNISONE 5 MG PO TABS: 5.0000 mg | ORAL_TABLET | Freq: Every day | ORAL | Status: DC

## 2017-04-17 MED ORDER — PREDNISONE 20 MG PO TABS
40.0000 mg | ORAL_TABLET | Freq: Every day | ORAL | Status: AC
  Administered 2017-04-18 – 2017-04-19 (×2): 40 mg via ORAL
  Filled 2017-04-17 (×2): qty 2

## 2017-04-17 MED ORDER — PREDNISONE 10 MG PO TABS: 10.0000 mg | ORAL_TABLET | Freq: Every day | ORAL | Status: DC

## 2017-04-17 MED ORDER — FUROSEMIDE 40 MG PO TABS
40.0000 mg | ORAL_TABLET | Freq: Every day | ORAL | Status: DC
  Filled 2017-04-17: qty 1

## 2017-04-17 MED ORDER — ASPIRIN EC 81 MG PO TBEC
81.0000 mg | DELAYED_RELEASE_TABLET | Freq: Every day | ORAL | Status: DC
  Administered 2017-04-17 – 2017-04-21 (×5): 81 mg via ORAL
  Filled 2017-04-17 (×5): qty 1

## 2017-04-17 MED ORDER — INSULIN GLARGINE 100 UNIT/ML ~~LOC~~ SOLN
25.0000 [IU] | Freq: Every day | SUBCUTANEOUS | Status: DC
  Administered 2017-04-18 – 2017-04-21 (×4): 25 [IU] via SUBCUTANEOUS
  Filled 2017-04-17 (×4): qty 0.25

## 2017-04-17 MED ORDER — PREDNISONE 20 MG PO TABS
30.0000 mg | ORAL_TABLET | Freq: Every day | ORAL | Status: AC
  Administered 2017-04-20 – 2017-04-21 (×2): 30 mg via ORAL
  Filled 2017-04-17 (×2): qty 1

## 2017-04-17 NOTE — Progress Notes (Signed)
Pt blood sugar 425 gave s.s plus neal coverage paged MD for waiting on stat random Glucose result.  Patient is up in chair eating dinner with niece at the bedside.

## 2017-04-17 NOTE — Progress Notes (Signed)
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 [place] r groin was negative.   Being treated for diastolic HF and AECOPD. Now on IV steroids. Feeling better. Cough and dyspnea improving. Had AKI yesterday so IV lasix stopped and given NS 250cc. Creatinine improving. CBGs . 400 with steroids yesterday and insulin adjusted. CBG 160 this am    Creatinine 2.7-> 3.0->2.84 . K 4.9  Objective:   Weight Range: 96 lb 9 oz (43.8 kg) Body mass index is 20.18 kg/m.   Vital Signs:   Temp:  [98.1 F (36.7 C)-98.6 F (37 C)] 98.1 F (36.7 C) (12/23 0433) Pulse Rate:  [78-96] 78 (12/23 0433) Resp:  [18-20] 18 (12/23 0433) BP: (114-159)/(55-73) 159/73 (12/23 0433) SpO2:  [91 %-95 %] 95 % (12/23 0433) Weight:  [96 lb 9 oz (43.8 kg)] 96 lb 9 oz (43.8 kg) (12/23 0433) Last BM Date: 04/14/17  Weight change: Filed Weights   04/15/17 0538 04/16/17 0535 04/17/17 0433  Weight: 93 lb 0.6 oz (42.2 kg) 98 lb 8.7 oz (44.7 kg) 96 lb 9 oz (43.8 kg)    Intake/Output:   Intake/Output Summary (Last 24 hours) at 04/17/2017 0720 Last data filed at 04/17/2017 0434 Gross per 24 hour  Intake 360 ml  Output 1125 ml  Net -765 ml      Physical Exam    General:  Sitting up in bed. Thin chronically ill appearing. No resp difficulty. Occasional cough HEENT: normal Neck: supple. JVP 7 Carotids 2+ bilat; no bruits. No lymphadenopathy or thryomegaly appreciated. Cor: PMI nondisplaced. Regular rate & rhythm. No rubs, gallops or murmurs. Lungs: decreased throughout, occasional minimal wheeze.  Abdomen: soft, nontender, nondistended. No hepatosplenomegaly. No bruits or masses. Good bowel sounds. Extremities: no cyanosis, clubbing, rash, edema. cachetic Neuro: alert & orientedx3, cranial nerves grossly intact. moves all 4 extremities w/o difficulty. Affect pleasant  Telemetry   NSR 70-90s  -personally checked.   EKG    N/A   Labs    CBC Recent Labs    04/15/17 0627 04/16/17 1248  WBC 4.0 5.1  NEUTROABS 2.1  --   HGB 9.7* 10.0*  HCT 29.8* 30.2*  MCV 89.8 88.8  PLT 90* 88*   Basic Metabolic Panel Recent Labs    04/16/17 1540 04/17/17 0356  NA 136 137  K 4.8 4.9  CL 103 104  CO2 21* 21*  GLUCOSE 340* 160*  BUN 101* 105*  CREATININE 3.03* 2.84*  CALCIUM 9.9 10.0   Liver Function Tests No results for input(s): AST, ALT, ALKPHOS, BILITOT, PROT, ALBUMIN in the last 72 hours. No results for input(s): LIPASE, AMYLASE in the last 72 hours. Cardiac Enzymes No results for input(s): CKTOTAL, CKMB, CKMBINDEX, TROPONINI in the last 72 hours.  BNP: BNP (last 3 results) Recent Labs    03/18/17 1240  BNP 182.0*    ProBNP (last 3 results) Recent Labs    04/07/17 1501  PROBNP 2,714*     D-Dimer No results for input(s): DDIMER in the last 72 hours. Hemoglobin A1C No results for input(s): HGBA1C in the last 72 hours. Fasting Lipid Panel No results for input(s): CHOL, HDL, LDLCALC, TRIG, CHOLHDL, LDLDIRECT in the last 72 hours. Thyroid Function Tests No results for input(s): TSH, T4TOTAL, T3FREE, THYROIDAB in the last 72 hours.  Invalid input(s): FREET3  Other results:   Imaging  No results found.   Medications:     Scheduled Medications: . feeding supplement (ENSURE ENLIVE)  237 mL Oral BID BM  . furosemide  40 mg Oral Daily  . heparin  5,000 Units Subcutaneous Q8H  . insulin aspart  0-20 Units Subcutaneous TID WC  . insulin aspart  0-5 Units Subcutaneous QHS  . insulin aspart  3 Units Subcutaneous TID WC  . insulin glargine  30 Units Subcutaneous Daily  . ipratropium  0.5 mg Nebulization BID  . loratadine  10 mg Oral Daily  . methylPREDNISolone (SOLU-MEDROL) injection  60 mg Intravenous Daily  . mometasone-formoterol  2 puff Inhalation BID  . montelukast  10 mg Oral QHS  . pantoprazole  40 mg Oral BID AC  . sodium chloride flush   3 mL Intravenous Q12H  . sucralfate  1 g Oral TID WC    Infusions: . sodium chloride      PRN Medications: sodium chloride, acetaminophen, docusate sodium, guaiFENesin-codeine, ipratropium-albuterol, lubiprostone, ondansetron (ZOFRAN) IV, sodium chloride, sodium chloride flush, traMADol    Patient Profile   Sonya Carpenter is a 71 y/o woman with DM2, CKD 4, COPD and AS. 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 Has been at [facility] in [place]. Seen in office today with respiratory distress due to worsening volume overload and severe R groin pain.  Assessment/Plan   1. A/C Diastolic Heart Failure  -Volume status improved with IV diuresis. Net neg 1L. Weight 97--> 96 lbs. Lasix was supposed to be held yesterday but she got 40mg  PO. She was given 250 NS for worsening renal function from overduiresis. Creat has improved. Scheduled for Lasix 40mg  daily today, which I will hold. Likely can restart tomorrow  2. Acute on chronic hypoxic respiratory failure due to AECOPD -Continue nebs, steroids and doxycycline. Wheezing and cough much improved. - Will switch IV solumedrol to prednisone and taper  3. R groin pain- Ultrasound negative. Dr Roxy Manns evaluated. Pain improved.   4. 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   5. H/O GI Bleed - hgb stable. See above  6. DMII - Sugars became markedly elevated with steroids. Now better controlled on increased insulin  7. AKI - kidney function improved 3--> 2.84 with gentle hydration  8. Hyperkalemia - resolved.   9. Severe deconditioning - PT seeing. Will need to go back to SNF at d/c  10. HTN - BP elevated this morning but otherwise in good range. Continue to monitor.  Medication concerns reviewed with patient and pharmacy team. Barriers identified: No Length of Stay: 3  Angelena Form, PA-C  04/17/2017, 7:20 AM  Patient seen and examined with Nell Range,  PA-C. We discussed all aspects of the encounter. I agree with the assessment and plan as stated above.  Respiratory status much improved with diuresis and treatment of AECOPD. Will begin to taper steroids. AKI improving. Will hold lasix today. Likely ready for SNF in am.   Glori Bickers, MD  11:51 AM

## 2017-04-18 ENCOUNTER — Encounter (HOSPITAL_COMMUNITY): Payer: Self-pay | Admitting: Physician Assistant

## 2017-04-18 LAB — BASIC METABOLIC PANEL
Anion gap: 11 (ref 5–15)
BUN: 117 mg/dL — AB (ref 6–20)
CALCIUM: 10.3 mg/dL (ref 8.9–10.3)
CO2: 25 mmol/L (ref 22–32)
CREATININE: 2.9 mg/dL — AB (ref 0.44–1.00)
Chloride: 101 mmol/L (ref 101–111)
GFR calc Af Amer: 18 mL/min — ABNORMAL LOW (ref >60)
GFR calc non Af Amer: 15 mL/min — ABNORMAL LOW (ref >60)
GLUCOSE: 204 mg/dL — AB (ref 65–99)
POTASSIUM: 4.3 mmol/L (ref 3.5–5.1)
SODIUM: 137 mmol/L (ref 135–145)

## 2017-04-18 LAB — CBC
HEMATOCRIT: 31.5 % — AB (ref 36.0–46.0)
Hemoglobin: 10.2 g/dL — ABNORMAL LOW (ref 12.0–15.0)
MCH: 29.1 pg (ref 26.0–34.0)
MCHC: 32.4 g/dL (ref 30.0–36.0)
MCV: 89.7 fL (ref 78.0–100.0)
PLATELETS: 93 10*3/uL — AB (ref 150–400)
RBC: 3.51 MIL/uL — ABNORMAL LOW (ref 3.87–5.11)
RDW: 14.4 % (ref 11.5–15.5)
WBC: 4 10*3/uL (ref 4.0–10.5)

## 2017-04-18 LAB — GLUCOSE, CAPILLARY
GLUCOSE-CAPILLARY: 199 mg/dL — AB (ref 65–99)
Glucose-Capillary: 267 mg/dL — ABNORMAL HIGH (ref 65–99)
Glucose-Capillary: 147 mg/dL — ABNORMAL HIGH (ref 65–99)
Glucose-Capillary: 249 mg/dL — ABNORMAL HIGH (ref 65–99)

## 2017-04-18 MED ORDER — CHLORHEXIDINE GLUCONATE CLOTH 2 % EX PADS
6.0000 | MEDICATED_PAD | Freq: Every day | CUTANEOUS | Status: DC
  Administered 2017-04-18 – 2017-04-21 (×2): 6 via TOPICAL

## 2017-04-18 MED ORDER — AMLODIPINE BESYLATE 2.5 MG PO TABS
2.5000 mg | ORAL_TABLET | Freq: Every day | ORAL | Status: DC
  Administered 2017-04-18 – 2017-04-19 (×2): 2.5 mg via ORAL
  Filled 2017-04-18 (×3): qty 1

## 2017-04-18 MED ORDER — IPRATROPIUM BROMIDE 0.02 % IN SOLN
0.5000 mg | Freq: Two times a day (BID) | RESPIRATORY_TRACT | Status: DC
  Administered 2017-04-18 – 2017-04-21 (×7): 0.5 mg via RESPIRATORY_TRACT
  Filled 2017-04-18 (×6): qty 2.5

## 2017-04-18 MED ORDER — FUROSEMIDE 40 MG PO TABS
40.0000 mg | ORAL_TABLET | Freq: Every day | ORAL | Status: DC
  Administered 2017-04-19 – 2017-04-20 (×2): 40 mg via ORAL
  Filled 2017-04-18 (×3): qty 1

## 2017-04-18 MED ORDER — MUPIROCIN 2 % EX OINT
1.0000 "application " | TOPICAL_OINTMENT | Freq: Two times a day (BID) | CUTANEOUS | Status: DC
  Administered 2017-04-18 – 2017-04-21 (×7): 1 via NASAL
  Filled 2017-04-18: qty 22

## 2017-04-18 NOTE — Progress Notes (Signed)
Physical Therapy Treatment Patient Details Name: Sonya Carpenter MRN: [id number] DOB: 20-Mar-1946 Today's Date: 04/18/2017    History of Present Illness 71 year old one-week history of shortness of breath, nonproductive cough and right groin pain. TAVR procedure earlier this month from a right femoral artery approach.    PT Comments    Pt not mobilizing as well as on eval. Pt requiring min assist for mobility and fatigues easily. Pt has been at [facility] since TAVR. Recommend pt return to SNF since she lives alone. Pt in agreement.   Follow Up Recommendations  SNF     Equipment Recommendations  None recommended by PT    Recommendations for Other Services       Precautions / Restrictions Precautions Precautions: Fall Restrictions Weight Bearing Restrictions: No    Mobility  Bed Mobility Overal bed mobility: Needs Assistance Bed Mobility: Supine to Sit     Supine to sit: Min assist;HOB elevated     General bed mobility comments: Assist to bring legs off bed and elevate trunk into sitting. Incr time to perform  Transfers Overall transfer level: Needs assistance Equipment used: Rolling walker (2 wheeled) Transfers: Sit to/from Stand Sit to Stand: Min assist         General transfer comment: Assist to bring hips up and for balance. Pt tremulous  Ambulation/Gait Ambulation/Gait assistance: Min assist Ambulation Distance (Feet): 80 Feet Assistive device: Rolling walker (2 wheeled) Gait Pattern/deviations: Step-through pattern;Trunk flexed;Decreased step length - right;Decreased step length - left;Shuffle;Narrow base of support Gait velocity: decr Gait velocity interpretation: Below normal speed for age/gender General Gait Details: Assist for balance and support.    Stairs            Wheelchair Mobility    Modified Rankin (Stroke Patients Only)       Balance Overall balance assessment: Needs assistance Sitting-balance support: Feet supported;No upper  extremity supported Sitting balance-Leahy Scale: Fair     Standing balance support: Bilateral upper extremity supported;During functional activity Standing balance-Leahy Scale: Poor Standing balance comment: walker and min guard for static standing                            Cognition Arousal/Alertness: Awake/alert Behavior During Therapy: WFL for tasks assessed/performed Overall Cognitive Status: Within Functional Limits for tasks assessed                                        Exercises      General Comments        Pertinent Vitals/Pain Pain Assessment: Faces Faces Pain Scale: Hurts little more Pain Location: hip/groin Pain Descriptors / Indicators: Sore Pain Intervention(s): Limited activity within patient's tolerance;Monitored during session    Home Living                      Prior Function            PT Goals (current goals can now be found in the care plan section) Progress towards PT goals: Not progressing toward goals - comment    Frequency    Min 3X/week      PT Plan Discharge plan needs to be updated    Co-evaluation              AM-PAC PT "6 Clicks" Daily Activity  Outcome Measure  Difficulty turning over in bed (including adjusting bedclothes,  sheets and blankets)?: A Little Difficulty moving from lying on back to sitting on the side of the bed? : Unable Difficulty sitting down on and standing up from a chair with arms (e.g., wheelchair, bedside commode, etc,.)?: Unable Help needed moving to and from a bed to chair (including a wheelchair)?: A Little Help needed walking in hospital room?: A Little Help needed climbing 3-5 steps with a railing? : Total 6 Click Score: 12    End of Session Equipment Utilized During Treatment: Gait belt Activity Tolerance: Patient limited by fatigue Patient left: in chair;with call bell/phone within reach;with chair alarm set Nurse Communication: Mobility status PT  Visit Diagnosis: Unsteadiness on feet (R26.81);Other abnormalities of gait and mobility (R26.89);Muscle weakness (generalized) (M62.81)     Time: 8786-7672 PT Time Calculation (min) (ACUTE ONLY): 21 min  Charges:  $Therapeutic Exercise: 8-22 mins                    G Codes:       [facility] [place] 04/18/2017, 10:30 AM

## 2017-04-18 NOTE — Clinical Social Work Note (Signed)
Clinical Social Work Assessment  Patient Details  Name: Sonya Carpenter MRN: [id number] Date of Birth: [birth year]  Date of referral:  04/18/17               Reason for consult:  Discharge Planning, Facility Placement                Permission sought to share information with:  Family Supports Permission granted to share information::  Yes, Verbal Permission Granted  Name::     Sonya Carpenter  Agency::  SNF  Relationship::  cousin  Contact Information:  [phone]  Housing/Transportation Living arrangements for the past 2 months:  [place], [place] of Information:  Patient Patient Interpreter Needed:  None Criminal Activity/Legal Involvement Pertinent to Current Situation/Hospitalization:  No - Comment as needed Significant Relationships:  None Lives with:  Self Do you feel safe going back to the place where you live?  Yes Need for family participation in patient care:  No (Coment)  Care giving concerns:  No family at bedside. Patient stated her cousin Sonya Carpenter helps her.   Social Worker assessment / plan:  CSW met patient at bedside to offer support and discuss discharge needs. Patient stated she is willing to discharge back to SNF and would prefer to go back to [facility]. Patient aware that because she is back in the hospital [facility] will have to start at the beginning.  CSW reach out to admission coordinator Sonya Carpenter for [facility]. Sonya Carpenter stated they are glad to take patent back and will start the auth process Wednesday once insurance company opens.  Employment status:  Part-Time Nurse, adult PT Recommendations:  [place] / Referral to community resources:  [place]  Patient/Family's Response to care:  Patient  very pleasant and eager to get back to baseline. 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    Patient/Family's Understanding of and Emotional Response to Diagnosis, Current Treatment, and Prognosis:  Patient agreeable to discharge back to facility   Emotional Assessment Appearance:  Appears stated age Attitude/Demeanor/Rapport:  Other Affect (typically observed):  Pleasant Orientation:  Oriented to Self, Oriented to Place, Oriented to  Time, Oriented to Situation Alcohol / Substance use:  Not Applicable Psych involvement (Current and /or in the community):  No (Comment)  Discharge Needs  Concerns to be addressed:  No discharge needs identified Readmission within the last 30 days:  Yes Current discharge risk:  None Barriers to Discharge:  No Barriers Identified   Wende Neighbors, LCSW 04/18/2017, 3:05 PM

## 2017-04-18 NOTE — Progress Notes (Addendum)
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 [place] r groin was negative.   Being treated for diastolic HF and AECOPD. Now on oral steroid taper. Feeling better. Cough and dyspnea improving. Had AKI 12/22 so IV lasix stopped and given NS 250cc. Creatinine improving. PO Lasix still on hold. CBGs 400 with steroids and insulin adjusted. CBG 204 this am. Still with occasionall cough. Appetite good.     Creatinine 2.7-> 3.0->2.84-> 2.9 . K 4.3  Objective:   Weight Range: 98 lb 12.3 oz (44.8 kg) Body mass index is 20.64 kg/m.   Vital Signs:   Temp:  [97.7 F (36.5 C)-99.3 F (37.4 C)] 97.7 F (36.5 C) (12/24 0513) Pulse Rate:  [77-103] 81 (12/24 0746) Resp:  [16-18] 16 (12/24 0746) BP: (119-152)/(65-74) 152/74 (12/24 0513) SpO2:  [94 %-95 %] 95 % (12/24 0513) FiO2 (%):  [91 %] 91 % (12/24 0748) Weight:  [98 lb 12.3 oz (44.8 kg)] 98 lb 12.3 oz (44.8 kg) (12/24 0513) Last BM Date: 04/14/17  Weight change: Filed Weights   04/16/17 0535 04/17/17 0433 04/18/17 0513  Weight: 98 lb 8.7 oz (44.7 kg) 96 lb 9 oz (43.8 kg) 98 lb 12.3 oz (44.8 kg)    Intake/Output:   Intake/Output Summary (Last 24 hours) at 04/18/2017 0756 Last data filed at 04/18/2017 0600 Gross per 24 hour  Intake 460 ml  Output 875 ml  Net -415 ml      Physical Exam    General:  Elderly female sitting up in bed. No resp difficulty HEENT: normal Neck: supple. no JVP 5-1 Carotids 2+ bilat; no bruits. No lymphadenopathy or thryomegaly appreciated. Cor: PMI nondisplaced. Regular rate & rhythm. No rubs, gallops or murmurs. Lungs: clear with decreased BS throughout + cough.  Abdomen: soft, nontender, nondistended. No hepatosplenomegaly. No bruits or masses. Good bowel sounds. Extremities: no cyanosis, clubbing, rash, edema Neuro: alert & orientedx3, cranial nerves grossly intact. moves all 4  extremities w/o difficulty. Affect pleasant   Telemetry   NSR 80s, intermittent sinus tach with HR in low 100s -personally checked.   EKG    N/A   Labs    CBC Recent Labs    04/16/17 1248  WBC 5.1  HGB 10.0*  HCT 30.2*  MCV 88.8  PLT 88*   Basic Metabolic Panel Recent Labs    04/17/17 0356 04/18/17 0609  NA 137 137  K 4.9 4.3  CL 104 101  CO2 21* 25  GLUCOSE 160* 204*  BUN 105* 117*  CREATININE 2.84* 2.90*  CALCIUM 10.0 10.3   Liver Function Tests No results for input(s): AST, ALT, ALKPHOS, BILITOT, PROT, ALBUMIN in the last 72 hours. No results for input(s): LIPASE, AMYLASE in the last 72 hours. Cardiac Enzymes No results for input(s): CKTOTAL, CKMB, CKMBINDEX, TROPONINI in the last 72 hours.  BNP: BNP (last 3 results) Recent Labs    03/18/17 1240  BNP 182.0*    ProBNP (last 3 results) Recent Labs    04/07/17 1501  PROBNP 2,714*     D-Dimer No results for input(s): DDIMER in the last 72 hours. Hemoglobin A1C No results for input(s): HGBA1C in the last 72 hours. Fasting Lipid Panel No results for input(s): CHOL, HDL, LDLCALC, TRIG, CHOLHDL, LDLDIRECT in the last 72 hours. Thyroid Function Tests No results for input(s): TSH, T4TOTAL, T3FREE, THYROIDAB in the last  72 hours.  Invalid input(s): FREET3  Other results:   Imaging    No results found.   Medications:     Scheduled Medications: . aspirin EC  81 mg Oral Daily  . feeding supplement (ENSURE ENLIVE)  237 mL Oral BID BM  . furosemide  40 mg Oral Daily  . heparin  5,000 Units Subcutaneous Q8H  . insulin aspart  0-20 Units Subcutaneous TID WC  . insulin aspart  0-5 Units Subcutaneous QHS  . insulin aspart  3 Units Subcutaneous TID WC  . insulin glargine  25 Units Subcutaneous Daily  . ipratropium  0.5 mg Nebulization BID  . loratadine  10 mg Oral Daily  . mometasone-formoterol  2 puff Inhalation BID  . montelukast  10 mg Oral QHS  . pantoprazole  40 mg Oral BID AC  .  predniSONE  40 mg Oral Q breakfast   Followed by  . [START ON 04/20/2017] predniSONE  30 mg Oral Q breakfast   Followed by  . [START ON 04/22/2017] predniSONE  20 mg Oral Q breakfast   Followed by  . [START ON 04/23/2017] predniSONE  10 mg Oral Q breakfast   Followed by  . [START ON 04/24/2017] predniSONE  5 mg Oral Q breakfast  . sodium chloride flush  3 mL Intravenous Q12H  . sucralfate  1 g Oral TID WC    Infusions: . sodium chloride      PRN Medications: sodium chloride, acetaminophen, docusate sodium, guaiFENesin-codeine, ipratropium-albuterol, lubiprostone, ondansetron (ZOFRAN) IV, sodium chloride, sodium chloride flush, traMADol    Patient Profile   Ms. Hidgins is a 71 y/o woman with DM2, CKD 4, COPD and AS. 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 Has been at [facility] in [place]. Seen in office 12/20 with respiratory distress due to worsening volume overload and severe R groin pain.  Assessment/Plan   1. A/C Diastolic Heart Failure  -Volume status improved with IV diuresis. Net neg 1.4L. Weight now actually higher than on admission 97--> 98 lbs. IV lasix stopped and given 250 NS for worsening renal function from overduiresis. Creat still elevated but stable around 2.9. Restart PO lasix 40mg  daily today.    2. Acute on chronic hypoxic respiratory failure due to AECOPD -Continue nebs, steroids and doxycycline. Wheezing and cough much improved. - IV solumedrol switched to prednisone taper yesterday  3. R groin pain- Ultrasound negative. Dr Roxy Manns evaluated. Pain improved.   4. 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 H/O GI Bleed - hgb stable. See above  6. DMII - Sugars became markedly elevated with steroids. Now better controlled on increased insulin  7. AKI - kidney function 2.6--> 3--> 2.84--> 2.9. Restart PO lasix. M  8. Hyperkalemia - resolved.   9. Severe  deconditioning - PT/OT seeing. Will need to go back to SNF at d/c. I am waiting to hear back from SW on if we can get bed placement today.  10. HTN - BP labile. Elevated again this morning. Will add amlodipine 2.5 mg daily.   Medication concerns reviewed with patient and pharmacy team. Barriers identified: No Length of Stay: 4  Angelena Form, PA-C  04/18/2017, 7:56 AM  Patient seen and examined with Nell Range, PA-C. We discussed all aspects of the encounter. I agree with the assessment and plan as stated above.  Volume status looks good. Renal function improving. Respiratory status much  improved. Can go back to SNF today if bed available.   Glori Bickers, MD  10:10 AM

## 2017-04-18 NOTE — Plan of Care (Signed)
  Progressing Clinical Measurements: Ability to maintain clinical measurements within normal limits will improve 04/18/2017 1247 - Progressing by Alonna Buckler, RN Diagnostic test results will improve 04/18/2017 1247 - Progressing by Alonna Buckler, RN Respiratory complications will improve 04/18/2017 1247 - Progressing by Alonna Buckler, RN Activity: Risk for activity intolerance will decrease 04/18/2017 1247 - Progressing by Alonna Buckler, RN

## 2017-04-19 LAB — BASIC METABOLIC PANEL
Anion gap: 12 (ref 5–15)
BUN: 129 mg/dL — AB (ref 6–20)
CHLORIDE: 100 mmol/L — AB (ref 101–111)
CO2: 25 mmol/L (ref 22–32)
CREATININE: 2.9 mg/dL — AB (ref 0.44–1.00)
Calcium: 10.4 mg/dL — ABNORMAL HIGH (ref 8.9–10.3)
GFR calc Af Amer: 18 mL/min — ABNORMAL LOW (ref >60)
GFR calc non Af Amer: 15 mL/min — ABNORMAL LOW (ref >60)
GLUCOSE: 227 mg/dL — AB (ref 65–99)
POTASSIUM: 4.3 mmol/L (ref 3.5–5.1)
SODIUM: 137 mmol/L (ref 135–145)

## 2017-04-19 LAB — GLUCOSE, CAPILLARY
GLUCOSE-CAPILLARY: 216 mg/dL — AB (ref 65–99)
GLUCOSE-CAPILLARY: 259 mg/dL — AB (ref 65–99)
GLUCOSE-CAPILLARY: 420 mg/dL — AB (ref 65–99)
Glucose-Capillary: 356 mg/dL — ABNORMAL HIGH (ref 65–99)
Glucose-Capillary: 95 mg/dL (ref 65–99)

## 2017-04-19 NOTE — Progress Notes (Signed)
Nurse paged on call physician to report high blood sugar, waiting for call back.  Patient states she ate blueberry muffins brought by her niece.  Patient was given correction insulin dose per order.  Nurse will recheck patient's blood sugar level.  Marcille Blanco, RN

## 2017-04-19 NOTE — Progress Notes (Signed)
Cardiology MD paged second time regarding patient's blood sugar status. After giving 23 units per orders latest blood sugar is 356. Waiting for the response. Pt is asymptomatic.

## 2017-04-19 NOTE — Progress Notes (Signed)
Inpatient Diabetes Program Recommendations  AACE/ADA: New Consensus Statement on Inpatient Glycemic Control (2015)  Target Ranges:  Prepandial:   less than 140 mg/dL      Peak postprandial:   less than 180 mg/dL (1-2 hours)      Critically ill patients:  140 - 180 mg/dL   Results for ROBBY, PIRANI (MRN [id number]) as of 04/19/2017 11:26  Ref. Range 04/18/2017 07:18 04/18/2017 11:52 04/18/2017 16:42 04/18/2017 21:39  Glucose-Capillary Latest Ref Range: 65 - 99 mg/dL 199 (H)  7 units Novolog  +  25 units Lantus  267 (H)  14 units Novolog 147 (H)  6 units Novolog 249 (H)  2 units Novolog   Results for SHIREEN, RAYBURN (MRN [id number]) as of 04/19/2017 11:26  Ref. Range 04/19/2017 07:49  Glucose-Capillary Latest Ref Range: 65 - 99 mg/dL 216 (H)  10 units Novolog +  25 units Lantus    Home DM Meds: Lantus 30 units QHS       Novolog 6 units BID  Current Insulin Orders: Lantus 25 units daily                 Novolog Resistant Correction Scale/ SSI (0-20 units) TID AC + HS                 Novolog 3 units TID with meals      MD- Note Solumedrol stopped and patient now getting Prednisone taper.  Fasting glucose still a bit elevated.  Also still having some elevated post-meal glucose levels.  Please consider the following adjustments:  1. Increase Lantus to 30 units daily (home dose)  2 Increase Novolog Meal Coverage to: Novolog 6 units TID    --Will follow patient during hospitalization--  Wyn Quaker RN, MSN, CDE Diabetes Coordinator Inpatient Glycemic Control Team Team Pager: [phone] (8a-5p)

## 2017-04-19 NOTE — Progress Notes (Signed)
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 [place] r groin was negative.   Being treated for diastolic HF and AECOPD. Now on oral steroid taper. Had AKI 12/22 so IV lasix stopped and given NS 250cc.  Restarted back ion po lasix yesterday. Breathing stable. Still with occasional cough. No wheeze.    Creatinine 2.7-> 3.0->2.84-> 2.9-> 2.9 . K 4.3  Objective:   Weight Range: 45.1 kg (99 lb 6.4 oz) Body mass index is 20.77 kg/m.   Vital Signs:   Temp:  [98.8 F (37.1 C)-99.1 F (37.3 C)] 98.8 F (37.1 C) (12/25 0530) Pulse Rate:  [70-92] 70 (12/25 0530) Resp:  [16-18] 18 (12/25 0530) BP: (119-145)/(65-77) 145/73 (12/25 0530) SpO2:  [90 %-96 %] 95 % (12/25 0530) FiO2 (%):  [91 %] 91 % (12/24 0748) Weight:  [45.1 kg (99 lb 6.4 oz)] 45.1 kg (99 lb 6.4 oz) (12/25 0530) Last BM Date: 04/14/17  Weight change: Filed Weights   04/17/17 0433 04/18/17 0513 04/19/17 0530  Weight: 43.8 kg (96 lb 9 oz) 44.8 kg (98 lb 12.3 oz) 45.1 kg (99 lb 6.4 oz)    Intake/Output:   Intake/Output Summary (Last 24 hours) at 04/19/2017 0559 Last data filed at 04/19/2017 0551 Gross per 24 hour  Intake 837 ml  Output 1000 ml  Net -163 ml      Physical Exam    General:  Elderly female lying flat in bed  No resp difficulty HEENT: normal Neck: supple JVP flat  Carotids 2+ bilat; no bruits. No lymphadenopathy or thryomegaly appreciated. Cor: PMI nondisplaced. Regular rate & rhythm. No rubs, gallops or murmurs. Lungs: clear with decreased BS throughout  Abdomen: soft, nontender, nondistended. No hepatosplenomegaly. No bruits or masses. Good bowel sounds. Extremities: no cyanosis, clubbing, rash, edema Neuro: alert & orientedx3, cranial nerves grossly intact. moves all 4 extremities w/o difficulty. Affect pleasant   Telemetry   NSR 70s. Personally reviewed    EKG      N/A   Labs    CBC Recent Labs    04/16/17 1248 04/18/17 0811  WBC 5.1 4.0  HGB 10.0* 10.2*  HCT 30.2* 31.5*  MCV 88.8 89.7  PLT 88* 93*   Basic Metabolic Panel Recent Labs    04/17/17 0356 04/18/17 0609  NA 137 137  K 4.9 4.3  CL 104 101  CO2 21* 25  GLUCOSE 160* 204*  BUN 105* 117*  CREATININE 2.84* 2.90*  CALCIUM 10.0 10.3   Liver Function Tests No results for input(s): AST, ALT, ALKPHOS, BILITOT, PROT, ALBUMIN in the last 72 hours. No results for input(s): LIPASE, AMYLASE in the last 72 hours. Cardiac Enzymes No results for input(s): CKTOTAL, CKMB, CKMBINDEX, TROPONINI in the last 72 hours.  BNP: BNP (last 3 results) Recent Labs    03/18/17 1240  BNP 182.0*    ProBNP (last 3 results) Recent Labs    04/07/17 1501  PROBNP 2,714*     D-Dimer No results for input(s): DDIMER in the last 72 hours. Hemoglobin A1C No results for input(s): HGBA1C in the last 72 hours. Fasting Lipid Panel No results for input(s): CHOL, HDL, LDLCALC, TRIG, CHOLHDL, LDLDIRECT in the last 72 hours. Thyroid Function Tests No results for input(s): TSH, T4TOTAL, T3FREE, THYROIDAB in the last 72 hours.  Invalid input(s): FREET3  Other results:   Imaging  No results found.   Medications:     Scheduled Medications: . amLODipine  2.5 mg Oral Daily  . aspirin EC  81 mg Oral Daily  . Chlorhexidine Gluconate Cloth  6 each Topical Q0600  . feeding supplement (ENSURE ENLIVE)  237 mL Oral BID BM  . furosemide  40 mg Oral Daily  . heparin  5,000 Units Subcutaneous Q8H  . insulin aspart  0-20 Units Subcutaneous TID WC  . insulin aspart  0-5 Units Subcutaneous QHS  . insulin aspart  3 Units Subcutaneous TID WC  . insulin glargine  25 Units Subcutaneous Daily  . ipratropium  0.5 mg Nebulization BID  . loratadine  10 mg Oral Daily  . mometasone-formoterol  2 puff Inhalation BID  . montelukast  10 mg Oral QHS  . mupirocin ointment  1 application Nasal BID  .  pantoprazole  40 mg Oral BID AC  . predniSONE  40 mg Oral Q breakfast   Followed by  . [START ON 04/20/2017] predniSONE  30 mg Oral Q breakfast   Followed by  . [START ON 04/22/2017] predniSONE  20 mg Oral Q breakfast   Followed by  . [START ON 04/23/2017] predniSONE  10 mg Oral Q breakfast   Followed by  . [START ON 04/24/2017] predniSONE  5 mg Oral Q breakfast  . sodium chloride flush  3 mL Intravenous Q12H  . sucralfate  1 g Oral TID WC    Infusions: . sodium chloride      PRN Medications: sodium chloride, acetaminophen, docusate sodium, guaiFENesin-codeine, ipratropium-albuterol, lubiprostone, ondansetron (ZOFRAN) IV, sodium chloride, sodium chloride flush, traMADol    Patient Profile   Ms. Hidgins is a 71 y/o woman with DM2, CKD 4, COPD and AS. 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 Has been at [facility] in [place]. Seen in office 12/20 with respiratory distress due to worsening volume overload and severe R groin pain.  Assessment/Plan   1. A/C Diastolic Heart Failure  -Volume status looks good but weight going back up. Drinks a lot of fluid. Will increase lasix to 80 daily   2. Acute on chronic hypoxic respiratory failure due to AECOPD -Much improved. On nebs, steroids and doxycycline -Tapering steroids  3. R groin pain- Ultrasound negative. Dr Roxy Manns evaluated. Pain improved.   4. 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 H/O GI Bleed - hgb stable. See above  6. DMII - Sugars became markedly elevated with steroids. Now better controlled on increased insulin and steroid taper   7. AKI - kidney function 2.6--> 3--> 2.84--> 2.9-> 2.9  8. Hyperkalemia - resolved.   9. Severe deconditioning - PT/OT seeing. Will need to go back to SNF at d/c. Pending transfer tomorrow  10. HTN - Amlodipine 2.5 mg daily added yesterday. BP improved.   Medication concerns reviewed  with patient and pharmacy team. Barriers identified: No Length of Stay: [place], MD  04/19/2017, 5:59 AM

## 2017-04-20 DIAGNOSIS — J441 Chronic obstructive pulmonary disease with (acute) exacerbation: Secondary | ICD-10-CM

## 2017-04-20 LAB — CBC
HEMATOCRIT: 31.1 % — AB (ref 36.0–46.0)
HEMOGLOBIN: 10 g/dL — AB (ref 12.0–15.0)
MCH: 28.7 pg (ref 26.0–34.0)
MCHC: 32.2 g/dL (ref 30.0–36.0)
MCV: 89.4 fL (ref 78.0–100.0)
PLATELETS: 99 10*3/uL — AB (ref 150–400)
RBC: 3.48 MIL/uL — AB (ref 3.87–5.11)
RDW: 14.5 % (ref 11.5–15.5)
WBC: 5.6 10*3/uL (ref 4.0–10.5)

## 2017-04-20 LAB — BASIC METABOLIC PANEL
ANION GAP: 16 — AB (ref 5–15)
BUN: 127 mg/dL — ABNORMAL HIGH (ref 6–20)
CALCIUM: 10 mg/dL (ref 8.9–10.3)
CHLORIDE: 98 mmol/L — AB (ref 101–111)
CO2: 22 mmol/L (ref 22–32)
Creatinine, Ser: 2.69 mg/dL — ABNORMAL HIGH (ref 0.44–1.00)
GFR calc non Af Amer: 17 mL/min — ABNORMAL LOW (ref >60)
GFR, EST AFRICAN AMERICAN: 20 mL/min — AB (ref >60)
GLUCOSE: 292 mg/dL — AB (ref 65–99)
POTASSIUM: 4.1 mmol/L (ref 3.5–5.1)
Sodium: 136 mmol/L (ref 135–145)

## 2017-04-20 LAB — GLUCOSE, CAPILLARY
Glucose-Capillary: 147 mg/dL — ABNORMAL HIGH (ref 65–99)
Glucose-Capillary: 102 mg/dL — ABNORMAL HIGH (ref 65–99)
Glucose-Capillary: 299 mg/dL — ABNORMAL HIGH (ref 65–99)
Glucose-Capillary: 323 mg/dL — ABNORMAL HIGH (ref 65–99)
Glucose-Capillary: 289 mg/dL — ABNORMAL HIGH (ref 65–99)
Glucose-Capillary: 321 mg/dL — ABNORMAL HIGH (ref 65–99)

## 2017-04-20 MED ORDER — AMLODIPINE BESYLATE 5 MG PO TABS: 5.0000 mg | ORAL_TABLET | Freq: Every day | ORAL | 6 refills | Status: DC

## 2017-04-20 MED ORDER — ASPIRIN 81 MG PO TBEC: 81.0000 mg | DELAYED_RELEASE_TABLET | Freq: Every day | ORAL | Status: AC

## 2017-04-20 MED ORDER — POTASSIUM CHLORIDE CRYS ER 20 MEQ PO TBCR: 20.0000 meq | EXTENDED_RELEASE_TABLET | Freq: Every day | ORAL | 6 refills | Status: DC

## 2017-04-20 MED ORDER — PREDNISONE 10 MG PO TABS: ORAL_TABLET | ORAL | 0 refills | Status: DC

## 2017-04-20 MED ORDER — FUROSEMIDE 80 MG PO TABS: 80.0000 mg | ORAL_TABLET | Freq: Every day | ORAL | 6 refills | Status: DC

## 2017-04-20 MED ORDER — AMLODIPINE BESYLATE 5 MG PO TABS
5.0000 mg | ORAL_TABLET | Freq: Every day | ORAL | Status: DC
Start: 2017-04-20 — End: 2017-04-21
  Administered 2017-04-20 – 2017-04-21 (×2): 5 mg via ORAL
  Filled 2017-04-20: qty 1

## 2017-04-20 NOTE — Progress Notes (Signed)
Physical Therapy Treatment Patient Details Name: Sonya Carpenter MRN: [id number] DOB: 1945-11-30 Today's Date: 04/20/2017    History of Present Illness 71 year old one-week history of shortness of breath, nonproductive cough and right groin pain. TAVR procedure earlier this month from a right femoral artery approach.    PT Comments    Improving with mobility.  To d/c to SNF for continued therapy.   Follow Up Recommendations  SNF     Equipment Recommendations  None recommended by PT    Recommendations for Other Services       Precautions / Restrictions Precautions Precautions: Fall Restrictions Weight Bearing Restrictions: No    Mobility  Bed Mobility Overal bed mobility: Needs Assistance Bed Mobility: Supine to Sit     Supine to sit: Min guard;HOB elevated     General bed mobility comments: Assist for safety.  Transfers Overall transfer level: Needs assistance Equipment used: Rolling walker (2 wheeled) Transfers: Sit to/from Stand Sit to Stand: Min assist         General transfer comment: Assist to rise from bed and from toilet, and to steady.  Ambulation/Gait Ambulation/Gait assistance: Min guard;Min assist Ambulation Distance (Feet): 110 Feet Assistive device: Rolling walker (2 wheeled) Gait Pattern/deviations: Step-through pattern;Trunk flexed;Decreased step length - right;Decreased step length - left;Shuffle;Narrow base of support Gait velocity: decr Gait velocity interpretation: Below normal speed for age/gender General Gait Details: Assist for balance and support.    Stairs            Wheelchair Mobility    Modified Rankin (Stroke Patients Only)       Balance Overall balance assessment: Needs assistance Sitting-balance support: Feet supported;No upper extremity supported Sitting balance-Leahy Scale: Good     Standing balance support: Bilateral upper extremity supported;During functional activity Standing balance-Leahy Scale:  Poor Standing balance comment: UE support for balance.                            Cognition Arousal/Alertness: Awake/alert Behavior During Therapy: WFL for tasks assessed/performed Overall Cognitive Status: Within Functional Limits for tasks assessed                                        Exercises      General Comments        Pertinent Vitals/Pain Pain Assessment: Faces Faces Pain Scale: Hurts a little bit Pain Location: hip/groin Pain Descriptors / Indicators: Sore Pain Intervention(s): Monitored during session;Repositioned    Home Living                      Prior Function            PT Goals (current goals can now be found in the care plan section) Acute Rehab PT Goals Patient Stated Goal: to return to rehab  Progress towards PT goals: Progressing toward goals    Frequency    Min 3X/week      PT Plan Current plan remains appropriate    Co-evaluation              AM-PAC PT "6 Clicks" Daily Activity  Outcome Measure  Difficulty turning over in bed (including adjusting bedclothes, sheets and blankets)?: None Difficulty moving from lying on back to sitting on the side of the bed? : A Little Difficulty sitting down on and standing up from a chair with arms (e.g.,  wheelchair, bedside commode, etc,.)?: Unable Help needed moving to and from a bed to chair (including a wheelchair)?: A Little Help needed walking in hospital room?: A Little Help needed climbing 3-5 steps with a railing? : Total 6 Click Score: 15    End of Session Equipment Utilized During Treatment: Gait belt Activity Tolerance: Patient tolerated treatment well Patient left: in chair;with call bell/phone within reach;with chair alarm set Nurse Communication: Mobility status(In chair with alarm) PT Visit Diagnosis: Unsteadiness on feet (R26.81);Other abnormalities of gait and mobility (R26.89);Muscle weakness (generalized) (M62.81)     Time:  0677-0340 PT Time Calculation (min) (ACUTE ONLY): 22 min  Charges:  $Gait Training: 8-22 mins                    G Codes:       Carita Pian. Sanjuana Kava, [facility] Acute Rehab Services Pager [phone]    Despina Pole 04/20/2017, 12:54 PM

## 2017-04-20 NOTE — Progress Notes (Signed)
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 Korea r groin was negative.   Being treated for diastolic HF and AECOPD. Now on oral steroid taper. Had AKI 12/22 so IV lasix stopped and given NS 250cc.  Feeling good this am. Still having some groin tenderness, but much improved from admission.  Breathing well. Occasional cough.   Creatinine 2.7-> 3.0->2.84-> 2.9-> 2.9 -> BMET pending this am.   Weight down 2 lbs with increase in lasix.   Objective:   Weight Range: 97 lb 11.2 oz (44.3 kg) Body mass index is 20.42 kg/m.   Vital Signs:   Temp:  [97.3 F (36.3 C)-98 F (36.7 C)] 97.3 F (36.3 C) (12/26 0500) Pulse Rate:  [79-94] 87 (12/26 0500) Resp:  [16-18] 16 (12/26 0500) BP: (155-169)/(73-89) 158/89 (12/26 0500) SpO2:  [98 %] 98 % (12/26 0500) Weight:  [97 lb 11.2 oz (44.3 kg)] 97 lb 11.2 oz (44.3 kg) (12/26 0500) Last BM Date: 04/20/17  Weight change: Filed Weights   04/18/17 0513 04/19/17 0530 04/20/17 0500  Weight: 98 lb 12.3 oz (44.8 kg) 99 lb 6.4 oz (45.1 kg) 97 lb 11.2 oz (44.3 kg)   Intake/Output:   Intake/Output Summary (Last 24 hours) at 04/20/2017 0800 Last data filed at 04/20/2017 1638 Gross per 24 hour  Intake 840 ml  Output 2100 ml  Net -1260 ml   Physical Exam    General: Elderly appearing. Lying in bed  No resp difficulty. HEENT: Normal Neck: Supple. JVP does not appear elevated. Carotids 2+ bilat; no bruits. No thyromegaly or nodule noted. Cor: PMI nondisplaced. RRR, No M/G/R noted Lungs: Clear with decreased BS throughout. No wheeze Abdomen: Soft, non-tender, non-distended, no HSM. No bruits or masses. +BS  Extremities: No cyanosis, clubbing, or rash. R and LLE no edema.  Neuro: Alert & orientedx3, cranial nerves grossly intact. moves all 4 extremities w/o difficulty. Affect pleasant   Telemetry   NSR 70s, personally reviewed.    EKG    N/A  Labs    CBC Recent Labs    04/18/17 0811  WBC 4.0  HGB 10.2*  HCT 31.5*  MCV 89.7  PLT 93*   Basic Metabolic Panel Recent Labs    04/18/17 0609 04/19/17 0443  NA 137 137  K 4.3 4.3  CL 101 100*  CO2 25 25  GLUCOSE 204* 227*  BUN 117* 129*  CREATININE 2.90* 2.90*  CALCIUM 10.3 10.4*   Liver Function Tests No results for input(s): AST, ALT, ALKPHOS, BILITOT, PROT, ALBUMIN in the last 72 hours. No results for input(s): LIPASE, AMYLASE in the last 72 hours. Cardiac Enzymes No results for input(s): CKTOTAL, CKMB, CKMBINDEX, TROPONINI in the last 72 hours.  BNP: BNP (last 3 results) Recent Labs    03/18/17 1240  BNP 182.0*   ProBNP (last 3 results) Recent Labs    04/07/17 1501  PROBNP 2,714*   D-Dimer No results for input(s): DDIMER in the last 72 hours. Hemoglobin A1C No results for input(s): HGBA1C in the last 72 hours. Fasting Lipid Panel No results for input(s): CHOL, HDL, LDLCALC, TRIG, CHOLHDL, LDLDIRECT in the last 72 hours. Thyroid Function Tests No results for input(s): TSH, T4TOTAL, T3FREE, THYROIDAB in the last 72 hours.  Invalid input(s): FREET3  Other results:   Imaging    No results found.   Medications:     Scheduled  Medications: . amLODipine  2.5 mg Oral Daily  . aspirin EC  81 mg Oral Daily  . Chlorhexidine Gluconate Cloth  6 each Topical Q0600  . feeding supplement (ENSURE ENLIVE)  237 mL Oral BID BM  . furosemide  40 mg Oral Daily  . heparin  5,000 Units Subcutaneous Q8H  . insulin aspart  0-20 Units Subcutaneous TID WC  . insulin aspart  0-5 Units Subcutaneous QHS  . insulin aspart  3 Units Subcutaneous TID WC  . insulin glargine  25 Units Subcutaneous Daily  . ipratropium  0.5 mg Nebulization BID  . loratadine  10 mg Oral Daily  . mometasone-formoterol  2 puff Inhalation BID  . montelukast  10 mg Oral QHS  . mupirocin ointment  1 application Nasal BID  . pantoprazole  40 mg Oral BID AC  .  predniSONE  30 mg Oral Q breakfast   Followed by  . [START ON 04/22/2017] predniSONE  20 mg Oral Q breakfast   Followed by  . [START ON 04/23/2017] predniSONE  10 mg Oral Q breakfast   Followed by  . [START ON 04/24/2017] predniSONE  5 mg Oral Q breakfast  . sodium chloride flush  3 mL Intravenous Q12H  . sucralfate  1 g Oral TID WC    Infusions: . sodium chloride      PRN Medications: sodium chloride, acetaminophen, docusate sodium, guaiFENesin-codeine, ipratropium-albuterol, lubiprostone, ondansetron (ZOFRAN) IV, sodium chloride, sodium chloride flush, traMADol  Patient Profile   Sonya Carpenter is a 71 y/o woman with DM2, CKD 4, COPD and AS. 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 Has been at [facility] in [place]. Seen in office 12/20 with respiratory distress due to worsening volume overload and severe R groin pain.  Assessment/Plan   1. A/C Diastolic Heart Failure  - Volume status improved with increase lasix.  - Continue lasix 80 mg daily.   2. Acute on chronic hypoxic respiratory failure due to AECOPD - Much improved. On nebs, steroids and doxycycline. Resolved.  - Tapering steroids. No change.   3. R groin pain-  - Ultrasound negative. Dr Roxy Manns evaluated. Pain improved.   4. S/P TAVR 12/5: her Hg has remained stable and recent EGD/colonoscopy in [place] was unremarkable, so EC ASA 81mg  daily was added back this admission   5. H/O GI Bleed - Hgb stable. See above.   6. DMII - Sugars became markedly elevated with steroids. Now better controlled on increased insulin and steroid taper. No change.   7. AKI - Kidney function 2.6--> 3--> 2.84--> 2.9-> 2.9 -> BMET pending.   8. Hyperkalemia - Resolved.   9. Severe deconditioning - PT/OT seeing. Will need to go back to SNF at d/c. Pending transfer today.   10. HTN - Increase Amlodipine to 5 mg daily.   Plan for d/c back to SNF today so long as labs stable.   Medication concerns reviewed with patient and pharmacy team. Barriers  identified: None at this time.   Length of Stay: [address]  Annamaria Helling  04/20/2017, 8:00 AM  Patient seen and examined with the above-signed Advanced Practice Provider and/or Housestaff. I personally reviewed laboratory data, imaging studies and relevant notes. I independently examined the patient and formulated the important aspects of the plan. I have edited the note to reflect any of my changes or salient points. I have personally discussed the plan with the patient and/or family.  She is stable. Agree with plans for d/c back to SNF today. Will need  to watch volume status and CBGs closely with prednisone tape.

## 2017-04-20 NOTE — Discharge Summary (Signed)
[facility] VALVE TEAM   Discharge Summary    Patient ID: Sonya Carpenter,  MRN: [id number], DOB/AGE: 1946/02/13 71 y.o.  Admit date: 04/14/2017 Discharge date: 04/21/2017  Primary Care Provider: Michell Heinrich Primary Cardiologist: Dr.Zachary Angelina Sheriff, [place]) / Dr. Burt Knack &Dr. Roxy Manns (TAVR)  Discharge Diagnoses    Principal Problems:   Acute exacerbation of chronic obstructive pulmonary disease (COPD) ([place])   Acute on chronic diastolic heart failure ([place])   AKI (acute kidney injury) ([place]) Active Problems:     Hypertension   Hyperlipidemia   S/P TAVR (transcatheter aortic valve replacement)   COPD (chronic obstructive pulmonary disease) ([facility])   CKD (chronic kidney disease), stage IV ([facility])   Anemia   Allergies No Known Allergies   History of Present Illness     Sonya Hudginsis a 71 y.o.femalewith a history of CKD stage IV, insulin dependant DMT2 with multiple complications,HTN,asthma, COPD 2/2 second hand smoke exposure, chronic diastolic CHF, chronic anemia, recent GI bleeding s/p APC,andsevere aortic stenosis s/p TAVR (03/29/17) who was sent from cardiology clinic to [facility] ED on 04/14/17 for admission for worsening SOB, wheezing and acute groin pain.   She recently underwent TAVR on 03/29/17 with a42mm Edwards Sapien 3 THV via femoral cut down.She was volume overloaded from acute on chronic diastolic CHF and required IV Lasix.Her hemoglobin continued to drift down and she was found to have heme positive stool. She underwent an upper GI endoscopy which showed non-specific distal gastritisandoozing AVM vs. Cameron's erosions,treated with APC cautery.She was started onPPI and Carafate and plavix and ASAwere heldper GI's recommendations.She was discharged to [facility].  She was followed closely after discharge and had issues with worsening SOB and bright red blood in her stool. She saw Dr. Posey Pronto and had an EDG / colonoscopy  this past week in [place], [place]. There was no active bleeding but diverticulitis, hemorrhoids and hiatal hernia were noted. She was started on po lasix but continued to do worse and continued to become progressively more short of breath with wheezing, cough, orthopnea and PND. She also complained of acute groin pain at her cut down site. She was admitted from the office on 04/14/17 for further work up.  Hospital Course     Consultants: none  1. A/C Diastolic Heart Failure  - Volume status quickly improved after IV diuresis. This was converted to PO when renal function worsened to 3.0. She is now doing well on lasix 40mg  daily. Her CVP is 7. We will hold lasix today and restart 40mg  daily tomorrow.    2. Acute on chronic hypoxic respiratory failure due to AECOPD - This is now resolved after nebs, steroids and doxycycline. Will continue steroid taper at discharge  3. R groin pain - Ultrasound negative. Dr Roxy Manns evaluated. Pain improved.   4. S/P TAVR 12/4:  - Her Hg has remained stable and recent EGD/colonoscopy in [place] was unremarkable, so EC ASA 81mg  daily was added back this admission   5. H/O GI Bleed - Hgb stable. See above.   6. DMII - Sugars became markedly elevated with steroids. Now better controlled on increased insulin and steroid taper. No change.   7. AKI - Kidney function 2.6--> 3--> 2.84--> 2.9-> 2.9 -> 2.69-> 2.5. BMET at follow up   8. Hyperkalemia - Resolved.   9. Severe deconditioning - PT/OT seeing. Will need to go back to SNF at d/c. Pending transfer today.   10. HTN - BP was elevated and she was started  on Amlodipine, which was increased to 5 mg daily.    The patient has had an uncomplicated hospital course and is recovering well. She has been seen by Dr. Haroldine Laws today and deemed ready for discharge home. All follow-up appointments have been scheduled. Discharge medications are listed below.  _____________  Discharge Vitals Blood pressure  132/70, pulse 93, temperature 98.3 F (36.8 C), temperature source Oral, resp. rate 18, height 4\' 10"  (1.473 m), weight 97 lb 1.6 oz (44 kg), SpO2 97 %.  Filed Weights   04/19/17 0530 04/20/17 0500 04/21/17 0548  Weight: 99 lb 6.4 oz (45.1 kg) 97 lb 11.2 oz (44.3 kg) 97 lb 1.6 oz (44 kg)   VS:  BP 132/70 (BP Location: Right Arm)   Pulse 93   Temp 98.3 F (36.8 C) (Oral)   Resp 18   Ht 4\' 10"  (1.473 m)   Wt 97 lb 1.6 oz (44 kg) Comment: a scale  SpO2 97%   BMI 20.29 kg/m      GEN: elderly and frail appearing. Laying in bed.  HEENT: normal  Neck: CVP 7, carotid bruits, or masses Cardiac: RRR; no murmurs, rubs, or gallops,no edema  Respiratory: clear with decreased breath sounds throughout. No wheezing. GI: soft, nontender, nondistended, + BS MS: no deformity or atrophy  Skin: warm and dry, no rash Neuro:  Alert and Oriented x 3, Strength and sensation are intact Psych: euthymic mood, full affect    Labs & Radiologic Studies     CBC Recent Labs    04/20/17 0904  WBC 5.6  HGB 10.0*  HCT 31.1*  MCV 89.4  PLT 99*   Basic Metabolic Panel Recent Labs    04/20/17 0904 04/21/17 0625  NA 136 139  K 4.1 3.6  CL 98* 99*  CO2 22 25  GLUCOSE 292* 143*  BUN 127* 132*  CREATININE 2.69* 2.59*  CALCIUM 10.0 10.3   Liver Function Tests No results for input(s): AST, ALT, ALKPHOS, BILITOT, PROT, ALBUMIN in the last 72 hours. No results for input(s): LIPASE, AMYLASE in the last 72 hours. Cardiac Enzymes No results for input(s): CKTOTAL, CKMB, CKMBINDEX, TROPONINI in the last 72 hours. BNP Invalid input(s): POCBNP D-Dimer No results for input(s): DDIMER in the last 72 hours. Hemoglobin A1C No results for input(s): HGBA1C in the last 72 hours. Fasting Lipid Panel No results for input(s): CHOL, HDL, LDLCALC, TRIG, CHOLHDL, LDLDIRECT in the last 72 hours. Thyroid Function Tests No results for input(s): TSH, T4TOTAL, T3FREE, THYROIDAB in the last 72 hours.  Invalid  input(s): FREET3  Dg Chest 2 View  Result Date: 04/15/2017 CLINICAL DATA:  Encounter for CHF EXAM: CHEST  2 VIEW COMPARISON:  Yesterday FINDINGS: Status post transcatheter aortic valve replacement. Chronic cardiomegaly. Low volume chest without Kerley lines or effusion. Chronic mild interstitial coarsening and scarring. No air bronchogram. Osteopenia. IMPRESSION: Cardiomegaly without failure. Electronically Signed   By: Monte Fantasia M.D.   On: 04/15/2017 09:10   Dg Chest 2 View  Result Date: 04/14/2017 CLINICAL DATA:  71 year old one-week history of shortness of breath, nonproductive cough and right groin pain. TAVR procedure earlier this month from a right femoral artery approach. EXAM: CHEST  2 VIEW COMPARISON:  03/29/2017, 03/25/2017 and earlier, including CT chest 03/01/2017. FINDINGS: AP erect and lateral images were obtained. TAVR, with the valve struts in the expected orientation. Cardiac silhouette mildly enlarged for technique, unchanged. Thoracic aorta mildly atherosclerotic, unchanged. Hilar and mediastinal contours otherwise unremarkable. Linear scarring in the lingula and  the right middle lobe, unchanged. Lungs otherwise clear. Mild pulmonary venous hypertension without overt edema. No pleural effusions. Osseous demineralization, exaggeration of the usual thoracic kyphosis as noted previously. IMPRESSION: 1.  No acute cardiopulmonary disease. 2. Stable mild cardiomegaly without evidence of pulmonary edema. 3. Stable scarring in the lingula and right middle lobe. 4.  Aortic Atherosclerosis (ICD10-170.0) Electronically Signed   By: Evangeline Dakin M.D.   On: 04/14/2017 15:58   Dg Chest 2 View  Result Date: 03/25/2017 CLINICAL DATA:  Cough. EXAM: CHEST  2 VIEW COMPARISON:  Radiograph of March 22, 2017. FINDINGS: Stable cardiomegaly and central pulmonary vascular congestion. No pneumothorax or significant pleural effusion is noted. Right internal jugular catheter is unchanged in  position. Mild bibasilar subsegmental atelectasis is noted. Bony thorax is unremarkable. IMPRESSION: Mild bibasilar subsegmental atelectasis. Stable cardiomegaly and central pulmonary vascular congestion. Electronically Signed   By: Marijo Conception, M.D.   On: 03/25/2017 09:48   Dg Chest Port 1 View  Result Date: 03/29/2017 CLINICAL DATA:  Post TAVR, history asthma, coronary disease, CHF, COPD, diabetes mellitus EXAM: PORTABLE CHEST 1 VIEW COMPARISON:  Portable exam 1744 hours compared 03/25/2017 FINDINGS: RIGHT jugular catheter with tip projecting over RIGHT atrium. Rotated to the LEFT. Upper normal size of cardiac silhouette post TAVR. Mediastinal contours and pulmonary vascularity normal. Atherosclerotic calcification aorta. Bibasilar atelectasis greater on LEFT. Lungs otherwise clear. No definite infiltrate, pleural effusion or pneumothorax. Bones demineralized. IMPRESSION: Bibasilar atelectasis greater on LEFT. Electronically Signed   By: Lavonia Dana M.D.   On: 03/29/2017 18:13     Diagnostic Studies/Procedures    Groin Korea 04/15/17 Final Interpretation No evidence of a right groin pseudoaneurysm on this exam. _____________    Disposition   Pt is being discharged home today in good condition.  Follow-up Plans & Appointments     Contact information for follow-up providers    [facility].   Why:  They will do your home health care at your home Contact information: [place] [address] tele# [phone]       Eileen Stanford, PA-C. Go on 04/28/2017.   Specialties:  Cardiology, Radiology Why:  @ 1 pm for an ultrasound of your heart and follow up with [facility] information: [place] [id number] [phone]        [facility]. Go on 05/06/2017.   Specialty:  Cardiology Why:  9:00 AM, [facility], parking code [id number] Contact information: [address] [id number] [place] information for after-discharge care    [facility] .   Service:  Skilled Nursing Contact information: [place] [phone]                 Discharge Instructions    Diet - low sodium heart healthy   Complete by:  As directed    Increase activity slowly   Complete by:  As directed       Discharge Medications     Medication List    TAKE these medications   acetaminophen 500 MG tablet Commonly known as:  TYLENOL Take 500 mg by mouth every 6 (six) hours as needed for mild pain or moderate pain.   albuterol 108 (90 Base) MCG/ACT inhaler Commonly known as:  PROVENTIL HFA;VENTOLIN HFA  Inhale 2 puffs every 6 (six) hours as needed into the lungs for wheezing or shortness of breath.   AMITIZA 8 MCG capsule Generic drug:  lubiprostone Take 8 mcg by mouth every 12 (twelve) hours as needed for constipation.   amLODipine 5 MG tablet Commonly known as:  NORVASC Take 1 tablet (5 mg total) by mouth daily.   ARTIFICIAL TEARS 1-0.3 % Soln Generic drug:  Propylene Glycol-Glycerin Place 2 drops into both eyes daily as needed (for dry eyes).   aspirin 81 MG EC tablet Take 1 tablet (81 mg total) by mouth daily.   atorvastatin 10 MG tablet Commonly known as:  LIPITOR Take 10 mg by mouth at bedtime.   budesonide-formoterol 160-4.5 MCG/ACT inhaler Commonly known as:  SYMBICORT Inhale 2 puffs into the lungs daily as needed (for shortness of breath).   cetirizine 10 MG tablet Commonly known as:  ZYRTEC Take 10 mg at bedtime by mouth.   chlorhexidine 0.12 % solution Commonly known as:  PERIDEX Use as directed 15 mLs in the mouth or throat 2 (two) times daily.   CLEAR EYES NATURAL TEARS 5-6 MG/ML Soln Generic drug:  Polyvinyl Alcohol-Povidone Place 2 drops into both eyes 2 (two) times daily.     docusate sodium 100 MG capsule Commonly known as:  COLACE Take 100 mg by mouth every 12 (twelve) hours as needed for mild constipation.   feeding supplement (ENSURE ENLIVE) Liqd Take 237 mLs by mouth 2 (two) times daily between meals.   furosemide 40 MG tablet Commonly known as:  LASIX Take 1 tablet (40 mg total) by mouth daily. Start taking on:  04/22/2017 What changed:  when to take this   insulin aspart 100 UNIT/ML injection Commonly known as:  novoLOG Inject 0-24 Units into the skin every 4 (four) hours. What changed:    how much to take  when to take this   insulin glargine 100 UNIT/ML injection Commonly known as:  LANTUS Inject 0.08 mLs (8 Units total) into the skin daily. What changed:    how much to take  when to take this   ipratropium 0.02 % nebulizer solution Commonly known as:  ATROVENT Take 0.5 mg by nebulization 2 (two) times daily.   montelukast 10 MG tablet Commonly known as:  SINGULAIR Take 10 mg by mouth at bedtime.   multivitamin with minerals tablet Take 1 tablet by mouth daily.   pantoprazole 40 MG tablet Commonly known as:  PROTONIX Take 1 tablet (40 mg total) by mouth 2 (two) times daily before a meal.   potassium chloride 10 MEQ tablet Commonly known as:  K-DUR,KLOR-CON Take 1 tablet (10 mEq total) by mouth daily.   predniSONE 10 MG tablet Commonly known as:  DELTASONE Take 2 tablets (20 mg total) by mouth daily with breakfast for 1 day, THEN 1 tablet (10 mg total) daily with breakfast for 1 day, THEN 0.5 tablets (5 mg total) daily with breakfast for 1 day. Start taking on:  04/22/2017   sodium chloride 0.65 % Soln nasal spray Commonly known as:  OCEAN Place 2 sprays into both nostrils every 12 (twelve) hours as needed for congestion.   sucralfate 1 GM/10ML suspension Commonly known as:  CARAFATE Take 10 mLs (1 g total) by mouth 3 (three) times daily with meals.   traMADol 50 MG tablet Commonly known as:  ULTRAM Take 1 tablet  (50 mg total) by mouth every 12 (twelve) hours as needed for moderate pain.  Outstanding Labs/Studies   BMET, CBC  Duration of Discharge Encounter   Greater than 30 minutes including physician time.  Signed, Angelena Form PA-C 04/21/2017, 4:58 PM   Patient seen and examined with Nell Range, PA-C. We discussed all aspects of the encounter. I agree with the assessment and plan as stated above.  She is ready for d/c. Much improved. BUN/Creatinine up some. Will need to watch diuretic dose closely. Continue alsix 40mg  daily for now. Will check BMET in several days. BUN may also be up a bit due to steroids.   Glori Bickers, MD  6:06 PM

## 2017-04-20 NOTE — Progress Notes (Signed)
CSW following patient for discharge needs. Patient has bed at [facility] but facility is still waiting on authorization from insurance company before admitting patient into the facility.   Rhea Pink, MSW,  [place]

## 2017-04-21 LAB — BASIC METABOLIC PANEL
Anion gap: 15 (ref 5–15)
BUN: 132 mg/dL — AB (ref 6–20)
CHLORIDE: 99 mmol/L — AB (ref 101–111)
CO2: 25 mmol/L (ref 22–32)
CREATININE: 2.59 mg/dL — AB (ref 0.44–1.00)
Calcium: 10.3 mg/dL (ref 8.9–10.3)
GFR calc Af Amer: 20 mL/min — ABNORMAL LOW (ref >60)
GFR calc non Af Amer: 18 mL/min — ABNORMAL LOW (ref >60)
Glucose, Bld: 143 mg/dL — ABNORMAL HIGH (ref 65–99)
Potassium: 3.6 mmol/L (ref 3.5–5.1)
Sodium: 139 mmol/L (ref 135–145)

## 2017-04-21 LAB — GLUCOSE, CAPILLARY
GLUCOSE-CAPILLARY: 388 mg/dL — AB (ref 65–99)
Glucose-Capillary: 145 mg/dL — ABNORMAL HIGH (ref 65–99)

## 2017-04-21 MED ORDER — FUROSEMIDE 40 MG PO TABS: 40.0000 mg | ORAL_TABLET | Freq: Every day | ORAL | Status: DC

## 2017-04-21 MED ORDER — POTASSIUM CHLORIDE CRYS ER 10 MEQ PO TBCR: 10.0000 meq | EXTENDED_RELEASE_TABLET | Freq: Every day | ORAL | 0 refills | Status: DC

## 2017-04-21 MED ORDER — FUROSEMIDE 40 MG PO TABS: 40.0000 mg | ORAL_TABLET | Freq: Every day | ORAL | 6 refills | Status: DC

## 2017-04-21 MED ORDER — PREDNISONE 10 MG PO TABS: ORAL_TABLET | ORAL | 0 refills | Status: AC

## 2017-04-21 MED ORDER — PREDNISONE 10 MG PO TABS: ORAL_TABLET | ORAL | 0 refills | Status: DC

## 2017-04-21 NOTE — Progress Notes (Signed)
Inpatient Diabetes Program Recommendations  AACE/ADA: New Consensus Statement on Inpatient Glycemic Control (2015)  Target Ranges:  Prepandial:   less than 140 mg/dL      Peak postprandial:   less than 180 mg/dL (1-2 hours)      Critically ill patients:  140 - 180 mg/dL   Lab Results  Component Value Date   GLUCAP 388 (H) 04/21/2017   HGBA1C 7.4 (H) 03/18/2017    Review of Glycemic Control Results for [name], [name] (MRN [id number]) as of 04/21/2017 12:29  Ref. Range 04/20/2017 21:15 04/21/2017 06:25 04/21/2017 07:29 04/21/2017 11:20  Glucose-Capillary Latest Ref Range: 65 - 99 mg/dL 321 (H)  145 (H) 388 (H)   Diabetes history: Type 2 DM Outpatient Diabetes medications: Lantus 30 Units QHS, Novolog 6 Units BID Current orders for Inpatient glycemic control: Novolog 0-20 Resistant correction, Novolog 0-5 Units HS, Novolog 3 Units TID with meals, Lantus 25 Units QD  Inpatient Diabetes Program Recommendations:     Noted patient on Steroid taper which is contributing to high blood sugars, would expect they would continue to decrease with the decreasing dosage of prednisone. Concern on admission (12/20) that blood sugar was elevated to 586 mg/dL. This was prior to steroids (per patient and med rec.), nutritional supplements, and with home regimen of Lantus and Novolog. This may be something to watch even while off of steroids post taper.  Spoke with patient and verified home regimen, dosages that were being administered (Lantus 30QHS, Novolog 6 Units BID) and diet prior to this admission. Patient states that she was taking her medications using her insulin pens and never had a problem. She did have questions that were specific to diet, especially having to abide by carb modified and renal. So I will place consult for dietitian to help with education. But patient did state, " I eat better at home, as far as watching carbs and sugar than while at the hospital."   Saw discharge order and awaiting  insurance authorization for transfer to SNF.   Thanks, Bronson Curb, MSN, RNC-OB Diabetes Coordinator [phone] (8a-5p)

## 2017-04-21 NOTE — Clinical Social Work Note (Signed)
Spoke with Sonya Carpenter in admissions at [facility]. She has not received auth yet but the case worker at [employer] did call for updated therapy notes.  Dayton Scrape, [place]

## 2017-04-21 NOTE — Progress Notes (Signed)
Occupational Therapy Treatment Patient Details Name: Sonya Carpenter MRN: [id number] DOB: 02/16/1946 Today's Date: 04/21/2017    History of present illness 71 year old one-week history of shortness of breath, nonproductive cough and right groin pain. TAVR procedure earlier this month from a right femoral artery approach.   OT comments  Making excellent progress. Completed grooming task standing at sink, followed by functional ambulation @ 100 ft with 4 rest breaks with VSS. HR 101-115 and O2 99 RA. Pt states she is motivated to return to her PLOF. Continue to recommend rehab at [facility].   Follow Up Recommendations  SNF;Supervision/Assistance - 24 hour    Equipment Recommendations  None recommended by OT    Recommendations for Other Services      Precautions / Restrictions Precautions Precautions: Fall       Mobility Bed Mobility Overal bed mobility: Needs Assistance       Supine to sit: Supervision        Transfers Overall transfer level: Needs assistance Equipment used: Rolling walker (2 wheeled) Transfers: Sit to/from Stand Sit to Stand: Supervision              Balance     Sitting balance-Leahy Scale: Good                                     ADL either performed or assessed with clinical judgement   ADL Overall ADL's : Needs assistance/impaired     Grooming: Oral care;Wash/dry face;Wash/dry hands;Standing               Lower Body Dressing: Moderate assistance;Sit to/from stand               Functional mobility during ADLs: Minimal assistance;Rolling walker       Vision       Perception     Praxis      Cognition Arousal/Alertness: Awake/alert Behavior During Therapy: WFL for tasks assessed/performed Overall Cognitive Status: Within Functional Limits for tasks assessed                                          Exercises     Shoulder Instructions       General Comments      Pertinent  Vitals/ Pain       Pain Assessment: Faces Faces Pain Scale: Hurts a little bit Pain Location: hip/groin Pain Descriptors / Indicators: Sore Pain Intervention(s): Limited activity within patient's tolerance  Home Living                                          Prior Functioning/Environment              Frequency  Min 2X/week        Progress Toward Goals  OT Goals(current goals can now be found in the care plan section)  Progress towards OT goals: Progressing toward goals  Acute Rehab OT Goals Patient Stated Goal: to be independent again OT Goal Formulation: With patient Time For Goal Achievement: 04/30/17 Potential to Achieve Goals: Good ADL Goals Pt Will Perform Grooming: with min guard assist;standing Pt Will Perform Lower Body Bathing: with modified independence;with adaptive equipment;sit to/from stand Pt Will Perform Upper Body Dressing: with set-up  Pt Will Perform Lower Body Dressing: with min guard assist;with adaptive equipment;sit to/from stand Pt Will Transfer to Toilet: with min guard assist;ambulating;regular height toilet;grab bars Pt Will Perform Toileting - Clothing Manipulation and hygiene: with min guard assist;sit to/from stand Pt/caregiver will Perform Home Exercise Program: Increased strength;Both right and left upper extremity;With written HEP provided Additional ADL Goal #1: Pt will gather items necessary for ADL around her room with her RW independently. Additional ADL Goal #2: Pt will generalize energy conservation strategies in ADL and mobility independently.  Plan Discharge plan remains appropriate    Co-evaluation                 AM-PAC PT "6 Clicks" Daily Activity     Outcome Measure   Help from another person eating meals?: None Help from another person taking care of personal grooming?: None Help from another person toileting, which includes using toliet, bedpan, or urinal?: A Little Help from another person  bathing (including washing, rinsing, drying)?: A Little Help from another person to put on and taking off regular upper body clothing?: A Little Help from another person to put on and taking off regular lower body clothing?: A Little 6 Click Score: 20    End of Session Equipment Utilized During Treatment: Rolling walker;Gait belt  OT Visit Diagnosis: Unsteadiness on feet (R26.81);Muscle weakness (generalized) (M62.81)   Activity Tolerance Patient tolerated treatment well   Patient Left in chair;with call bell/phone within reach;with chair alarm set   Nurse Communication Mobility status        Time: 3013-1438 OT Time Calculation (min): 24 min  Charges: OT General Charges $OT Visit: 1 Visit OT Treatments $Self Care/Home Management : 23-37 mins  [facility], OT/L  [phone] 04/21/2017   Dorrance Sellick,HILLARY 04/21/2017, 10:15 AM

## 2017-04-21 NOTE — Progress Notes (Signed)
Clinical Social Worker facilitated patient discharge including contacting patient family and facility to confirm patient discharge plans.  Clinical information faxed to facility and family agreeable with plan.  CSW arranged ambulance transport via [facility] to [place]  .  RN to call [phone] and ask for RN that has [facility] wing (pt will be placed in room 14)  report prior to discharge.  Clinical Social Worker will sign off for now as social work intervention is no longer needed. Please consult [place] again if new need arises.  Rhea Pink, MSW, [place]

## 2017-04-21 NOTE — Progress Notes (Signed)
Gave report to [facility] facility in [place] and spoke to [employer]. Reviewed AVS with patient. Answered her questions. Pt is stable and ready for discharge.

## 2017-04-25 NOTE — Progress Notes (Signed)
[facility] [place]                                       Cardiology Office Note    Date:  04/28/2017   ID:  Brayla Pat, DOB 06/12/1945, MRN [id number]  PCP:  Michell Heinrich, DO  Cardiologist:  Dr.Zachary (Angelina Sheriff, [place]) but she wants to transfer care to Dr. Haroldine Laws  / Dr. Burt Knack &Dr. Roxy Manns (TAVR)  CC: 1 month follow up s/p TAVR   History of Present Illness:  Sonya Carpenter is a 71 y.o. 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  She had an echo about 3 years ago which showed moderate aortic stenosis but then was lost to follow-up because her husband was in poor health. He eventually died and she did fairly well until several months ago when she was hospitalized in [place] for what was felt to be pneumonia.  During that hospitalization she was thought to have a possible abdominal mass with biliary ductal dilatation and was referred to [facility] where she was diagnosed with choledocho-cholelithiasis by ERCP. She did not have an abdominal mass but did have gallstones and was told that she required cholecystectomy. Preoperative cardiac clearance was requested.  She was subsequently readmitted to the hospital in [place] in September with shortness of breath and a large right pleural effusion that required thoracentesis. She was treated for possible pneumonia.  A transthoracic echocardiogram at that time showed a mean transvalvular gradient of 69 mmHg with a peak of 121 mmHg. She also had mild-mod MS with mean gradient of 6.9.   She was then seen by Dr. Burt Knack for consideration of TAVR and underwent cardiac catheterization on February 09, 2017.  This showed a 70% proximal left  circumflex coronary stenosis with otherwise mild nonobstructive coronary disease. The mean transvalvular gradient was 18.2 mmHg and a peak to peak gradient was 35 mmHg.  Aortic valve area was calculated at 1.26 cm.   She underwent TAVR on 03/29/17 with a85mm Edwards Sapien 3 THV via femoral cut down.Intraoperative TEE showed severe AS and mild MS. POD #1 echo showed normally functioning TAVR valve with mean gradient 17 mm Hg and severe MS, mild-mod TR. Post op course was complicated by volume overload requriing IV Lasix.Her hemoglobin continued to drift down and she was found to have heme positive stool. She underwent an upper GI endoscopy which showed non-specific distal gastritisandoozing AVM vs. Cameron's erosions,treated with APC cautery.She was started onPPI and Carafate and plavix and ASAwere heldper GI's recommendations.She was discharged to [facility].  She was followed closely after discharge and had issues with worsening SOB and bright red blood in her stool. She saw Dr. Posey Pronto and had an EDG / colonoscopy two weeks ago In [place], [place]. There was no active bleeding but diverticulitis, hemorrhoids and hiatal hernia were noted. She was started on po lasix but continued to do worse and continued to become progressively more short of breath with wheezing, cough, orthopnea and PND. She also complained of acute groin pain at her cut down site. She was admitted from the office on 04/14/17 for further work up.  She was admitted 12/20-12/26/18  for diuresis, treatment of AECOPD and evaluation of groin pain. Groin [place] was negative for pseudoaneurysm. She improved with IV lasix and nebs, steroids and doxycycline. Her Hg remained stable and she was restarted on baby ASA. Her BP was noted to be elevated and she was started on amlodipine 5mg  daily.  Today she presents to clinic for follow up. She is feeling much better. No CP. Breathing is much better. Cough and wheeze have resolved. Groin pain  better. Has some constipation. No more blood in stool. No blood in urine. No dizziness or syncope. No palpitations. Feeling much better. She no longer wants to follow with Dr. Alroy Dust in [place]. She wants to follow with Dr. Haroldine Laws in [place]. She has a previously scheduled appointment for next week in the advanced CHF clinic for follow up which I have advised her to keep.   Past Medical History:  Diagnosis Date  . Anemia   . Aortic stenosis   . Arthritis    back (04/14/2017)  . Asthma   . CAD (coronary artery disease)   . Chronic bronchitis ([place])   . Chronic diastolic congestive heart failure ([place])   . CKD (chronic kidney disease), stage IV ([place])   . COPD (chronic obstructive pulmonary disease) ([place])   . Gallbladder disease   . GERD (gastroesophageal reflux disease)   . History of blood transfusion "several times"   "low HgB"  . History of hiatal hernia   . Hyperlipidemia   . Hypertension   . Mitral stenosis   . 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 Type II diabetes mellitus ([place])   . Vitamin D deficiency     Past Surgical History:  Procedure Laterality Date  . BREAST CYST EXCISION Right   . CARDIAC CATHETERIZATION    . CATARACT EXTRACTION W/ INTRAOCULAR LENS  IMPLANT, BILATERAL Bilateral   . COLONSCOPY    . ESOPHAGOGASTRODUODENOSCOPY N/A 04/01/2017   Procedure: ESOPHAGOGASTRODUODENOSCOPY (EGD);  Surgeon: Milus Banister, MD;  Location: [facility] ENDOSCOPY;  Service: Endoscopy;  Laterality: N/A;  . FRACTURE SURGERY    . MULTIPLE EXTRACTIONS WITH ALVEOLOPLASTY N/A 03/14/2017   Procedure: Extraction of tooth #'s 2-11, 15, 19-28 and 31 with alveoloplasty, bilateral mandibular tori reductions and bilateral mandibular lingual exostoses reductions.;  Surgeon: Lenn Cal, DDS;  Location: [facility];  Service: Oral Surgery;  Laterality: N/A;  . PATELLA  FRACTURE SURGERY Right   . RETINAL LASER PROCEDURE Bilateral   . RIGHT/LEFT HEART CATH AND CORONARY ANGIOGRAPHY N/A 02/09/2017   Procedure: RIGHT/LEFT HEART CATH AND CORONARY ANGIOGRAPHY;  Surgeon: Burnell Blanks, MD;  Location: [facility] CV LAB;  Service: Cardiovascular;  Laterality: N/A;  . TEE WITHOUT CARDIOVERSION N/A 03/29/2017   Procedure: TRANSESOPHAGEAL ECHOCARDIOGRAM (TEE);  Surgeon: [name], MD;  Location: [facility];  Service: Open Heart Surgery;  Laterality: N/A;  . TONSILLECTOMY    . TRANSCATHETER AORTIC VALVE REPLACEMENT, TRANSFEMORAL N/A 03/29/2017   Procedure: TRANSCATHETER AORTIC VALVE REPLACEMENT, TRANSFEMORAL;  Surgeon: [name], MD;  Location: [facility];  Service: Open Heart Surgery;  Laterality: N/A;    Current Medications: Outpatient Medications Prior to Visit  Medication Sig Dispense Refill  . acetaminophen (TYLENOL) 500 MG tablet Take 500 mg by mouth every 6 (six) hours as needed for mild pain or moderate pain.    Marland Kitchen albuterol (PROVENTIL HFA;VENTOLIN HFA) 108 (90 Base) MCG/ACT inhaler Inhale 2 puffs every 6 (  six) hours as needed into the lungs for wheezing or shortness of breath.     Marland Kitchen amLODipine (NORVASC) 5 MG tablet Take 1 tablet (5 mg total) by mouth daily. 30 tablet 6  . aspirin EC 81 MG EC tablet Take 1 tablet (81 mg total) by mouth daily.    Marland Kitchen atorvastatin (LIPITOR) 10 MG tablet Take 10 mg by mouth at bedtime.    . budesonide-formoterol (SYMBICORT) 160-4.5 MCG/ACT inhaler Inhale 2 puffs into the lungs daily as needed (for shortness of breath).     . cetirizine (ZYRTEC) 10 MG tablet Take 10 mg at bedtime by mouth.     . chlorhexidine (PERIDEX) 0.12 % solution Use as directed 15 mLs in the mouth or throat 2 (two) times daily. 480 mL prn  . docusate sodium (COLACE) 100 MG capsule Take 100 mg by mouth every 12 (twelve) hours as needed for mild constipation.     . feeding supplement, ENSURE ENLIVE, (ENSURE ENLIVE) LIQD Take 237 mLs by mouth 2 (two) times  daily between meals. 237 mL 12  . furosemide (LASIX) 40 MG tablet Take 1 tablet (40 mg total) by mouth daily. 30 tablet 6  . insulin aspart (NOVOLOG) 100 UNIT/ML injection Inject 0-24 Units into the skin every 4 (four) hours. (Patient taking differently: Inject 6 Units into the skin 2 (two) times daily. ) 10 mL 11  . insulin glargine (LANTUS) 100 UNIT/ML injection Inject 0.08 mLs (8 Units total) into the skin daily. (Patient taking differently: Inject 30 Units into the skin at bedtime. ) 10 mL 11  . ipratropium (ATROVENT) 0.02 % nebulizer solution Take 0.5 mg by nebulization 2 (two) times daily.    Marland Kitchen lubiprostone (AMITIZA) 8 MCG capsule Take 8 mcg by mouth every 12 (twelve) hours as needed for constipation.     . montelukast (SINGULAIR) 10 MG tablet Take 10 mg by mouth at bedtime.    . Multiple Vitamins-Minerals (MULTIVITAMIN WITH MINERALS) tablet Take 1 tablet by mouth daily.    . pantoprazole (PROTONIX) 40 MG tablet Take 1 tablet (40 mg total) by mouth 2 (two) times daily before a meal. 60 tablet 1  . Polyvinyl Alcohol-Povidone (CLEAR EYES NATURAL TEARS) 5-6 MG/ML SOLN Place 2 drops into both eyes 2 (two) times daily.    . potassium chloride SA (K-DUR,KLOR-CON) 10 MEQ tablet Take 1 tablet (10 mEq total) by mouth daily. 30 tablet 0  . Propylene Glycol-Glycerin (ARTIFICIAL TEARS) 1-0.3 % SOLN Place 2 drops into both eyes daily as needed (for dry eyes).    . sodium chloride (OCEAN) 0.65 % SOLN nasal spray Place 2 sprays into both nostrils every 12 (twelve) hours as needed for congestion.     . sucralfate (CARAFATE) 1 GM/10ML suspension Take 10 mLs (1 g total) by mouth 3 (three) times daily with meals. 420 mL 0  . traMADol (ULTRAM) 50 MG tablet Take 1 tablet (50 mg total) by mouth every 12 (twelve) hours as needed for moderate pain. 14 tablet 0   No facility-administered medications prior to visit.      Allergies:   Patient has no known allergies.   Social History   Socioeconomic History  .  Marital status: Widowed    Spouse name: None  . Number of children: 0  . Years of education: None  . Highest education level: None  Social Needs  . Financial resource strain: None  . Food insecurity - worry: None  . Food insecurity - inability: None  . Transportation needs -  medical: None  . Transportation needs - non-medical: None  Occupational History  . 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 Smoking status: Passive Smoke Exposure - Never Smoker  . Smokeless tobacco: Never Used  Substance and Sexual Activity  . Alcohol use: No    Frequency: Never  . Drug use: No  . Sexual activity: No  Other Topics Concern  . None  Social History Narrative   Here with Uncle and Cousin.     Family History:  The patient's family history includes Cancer in her sister; Diabetes in her father; Heart disease in her father and mother; Hypertension in her father and mother; Kidney failure in her mother.      ROS:   Please see the history of present illness.    ROS All other systems reviewed and are negative.   PHYSICAL EXAM:   VS:  BP 133/71   Pulse 99   Ht 4\' 10"  (1.473 m)   Wt 106 lb 9.6 oz (48.4 kg)   SpO2 95%   BMI 22.28 kg/m    GEN: Well nourished, well developed, in no acute distress, chronically ill appearing. Appears much stronger than last week  HEENT: normal  Neck: no JVD, carotid bruits, or masses Cardiac: RRR; soft murmur @ RUSB. No rubs, or gallops,no edema  Respiratory:  clear to auscultation bilaterally, normal work of breathing GI: soft, nontender, nondistended, + BS MS: no deformity or atrophy  Skin: warm and dry, no rash Neuro:  Alert and Oriented x 3, Strength and sensation are intact Psych: euthymic mood, full affect   Wt Readings from Last 3 Encounters:  04/28/17 106 lb 9.6 oz (48.4 kg)  04/21/17 97 lb 1.6 oz (44 kg)  04/14/17 107 lb  9.6 oz (48.8 kg)      Studies/Labs Reviewed:   EKG:  EKG is NOT ordered today.    Recent Labs: 03/18/2017: B Natriuretic Peptide 182.0 03/27/2017: ALT 22 03/30/2017: Magnesium 2.0 04/07/2017: NT-Pro BNP 2,714 04/20/2017: Hemoglobin 10.0; Platelets 99 04/21/2017: BUN 132; Creatinine, Ser 2.59; Potassium 3.6; Sodium 139   Lipid Panel No results found for: CHOL, TRIG, HDL, CHOLHDL, VLDL, LDLCALC, LDLDIRECT  Additional studies/ records that were reviewed today include:  TAVR OPERATIVE NOTE  Date of Procedure:03/30/2017  Preoperative Diagnosis:Severe Aortic Stenosis   Postoperative Diagnosis:Same   Procedure:   Transcatheter Aortic Valve Replacement -OpenTransfemoral Approach Edwards Sapien 3 THV (size 44mm, model # 9600TFX, serial # [id number])  Co-Surgeons:Clarence H. Roxy Manns, MD and [name], MD  Anesthesiologist:Charlene Nyoka Cowden, MD  Echocardiographer:Dalton Aundra Dubin, MD  Pre-operative Echo Findings: ? Severe aortic stenosis ? Normalleft ventricular systolic function  Post-operative Echo Findings: ? traceparavalvular leak ? normalleft ventricular systolic function  _________________  Post operative echo:03/30/17  Study Conclusions - Left ventricle: The cavity size was normal. Wall thickness was normal. Systolic function was normal. The estimated ejection fraction was in the range of 60% to 65%. Wall motion was normal; there were no regional wall motion abnormalities. Doppler parameters are consistent with abnormal left ventricular relaxation (grade 1 diastolic dysfunction). - Aortic valve: A bioprosthesis was present. Valve area (VTI): 1.23 cm^2. Valve area (Vmax): 1.07 cm^2. Valve area (Vmean): 1.2 cm^2. - Mitral valve: Mildly calcified annulus. Moderately thickened, moderately calcified leaflets . The findings are consistent  with severe stenosis. Valve area by pressure half-time: 2.14 cm^2. Valve area by continuity equation (using LVOT  flow): 1.03 cm^2. - Left atrium: The atrium was severely dilated. - Tricuspid valve: There was mild-moderate regurgitation.  _________________   Groin [place] 04/15/17 Final Interpretation No evidence of a right groin pseudoaneurysm on this exam.  _________________  2D ECHO 04/29/17 (1 month s/p TAVR) Study Conclusions: - Left ventricle: The cavity size was normal. There was moderate   concentric hypertrophy. Systolic function was vigorous. The   estimated ejection fraction was in the range of 65% to 70%. Wall   motion was normal; there were no regional wall motion   abnormalities. Doppler parameters are consistent with abnormal   left ventricular relaxation (grade 1 diastolic dysfunction).   Doppler parameters are consistent with severely elevated   ventricular end-diastolic filling pressure. - Aortic valve: S/P TAVR with a Edwards Sapien 3 Transcatheter   Heart Valve (size 23 mm). Mean gradient (S): 19 mm Hg. Peak   gradient (S): 32 mm Hg. - Mitral valve: Severely calcified annulus. Severely thickened,   severely calcified leaflets . Mobility was restricted. The   findings are consistent with severe stenosis. There was mild   regurgitation. Mean gradient (D): 13 mm Hg. Peak gradient (D): 24   mm Hg. - Left atrium: The atrium was severely dilated. - Right ventricle: Systolic function was normal. - Tricuspid valve: There was mild regurgitation. - Pulmonary arteries: Systolic pressure was within the normal   range. Impressions: - When compared to the prior study from 03/29/2017 there is a new   trans catheter 23 mm Edwards SAPIEN 3 valve in the aortic   position. Transaortic gradients are at the upper normal limit   (mean gradient 19 mmHg, peak velocity 2.8 m/s). There is trivial   paravalvular leak. There is severe mitral stenosis with mean trans mitral gradient    13 mmHg.  ASSESSMENT & PLAN:   Severe AS s/p TAVR: she has NHYA class II symptoms. 2D ECHO today shows a normally functioning TAVR valve with mean gradient 19 mm Hg and no PVL. Gradients mildly elevated but stable from POD 1 echo (mean gradient 17 mm Hg). SBE prophylaxis discussed. I discussed DAPT with Dr. Burt Knack. We both feel she is too high risk for recurrent bleeding. Plan to continue lifelong ASA 81 mg daily. I will see her back in 1 year with an echo.    Chronic diastolic CHF: appears euvolemic today. Her weight is up from 9 lbs (97--> 106 lbs) but her lungs sound clear and neck veins are flat. Continue Lasix 40mg  daily/ Kdur 80mEq daily. Will check BMET today. She has an appointment with the advanced CHF clinic next week where she would like to follow long term.   Hx of GI bleed: Hg has remained stable and she has been resumed on ASA 81 mg daily. Will check CBC today.  COPD: stable. She has completed her prednisone taper.    DMT2: continue current regimen   CAD: pre TAVR cath showed single vessel CAD with moderately severe, calcified stenosis in the pLCx. Continue ASA and statin.   Severe mitral stenosis: not a surgical candidate.   Medication Adjustments/Labs and Tests Ordered: Current medicines are reviewed at length with the patient today.  Concerns regarding medicines are outlined above.  Medication changes, Labs and Tests ordered today are listed in the Patient Instructions below. Patient Instructions  Medication Instructions:  Your physician recommends that you continue on your current medications as directed. Please refer to the Current Medication list given to you today.   Labwork: Lab work to  be done today--BMP and CBC  Testing/Procedures: Your physician has requested that you have an echocardiogram. Echocardiography is a painless test that uses sound waves to create images of your heart. It provides your doctor with information about the size and shape of your heart and  how well your heart's chambers and valves are working. This procedure takes approximately one hour. There are no restrictions for this procedure.  To be done in 11 months on day of appointment with K. Grandville Silos, PA  Follow-Up: Follow up as planned at the [facility] Failure Clinic.  The parking code for January is 9000  Follow up in 11 months.  with K. Grandville Silos, [place].  We will call you to schedule this appointment  Any Other Special Instructions Will Be Listed Below (If Applicable).     If you need a refill on your cardiac medications before your next appointment, please call your pharmacy.      Signed, Angelena Form, PA-C  04/28/2017 5:13 PM    [facility] [address], [place], [place]  [address] Phone: [phone]; Fax: [phone]

## 2017-04-28 ENCOUNTER — Other Ambulatory Visit: Payer: Self-pay

## 2017-04-28 ENCOUNTER — Ambulatory Visit: Payer: Medicare HMO | Admitting: Physician Assistant

## 2017-04-28 ENCOUNTER — Encounter: Payer: Self-pay | Admitting: Physician Assistant

## 2017-04-28 ENCOUNTER — Ambulatory Visit (HOSPITAL_COMMUNITY): Payer: Medicare HMO | Attending: Cardiovascular Disease

## 2017-04-28 VITALS — BP 133/71 | HR 99 | Ht <= 58 in | Wt 106.6 lb

## 2017-04-28 DIAGNOSIS — J449 Chronic obstructive pulmonary disease, unspecified: Secondary | ICD-10-CM | POA: Insufficient documentation

## 2017-04-28 DIAGNOSIS — Z952 Presence of prosthetic heart valve: Secondary | ICD-10-CM | POA: Insufficient documentation

## 2017-04-28 DIAGNOSIS — Z8719 Personal history of other diseases of the digestive system: Secondary | ICD-10-CM | POA: Diagnosis not present

## 2017-04-28 DIAGNOSIS — N189 Chronic kidney disease, unspecified: Secondary | ICD-10-CM | POA: Insufficient documentation

## 2017-04-28 DIAGNOSIS — I5032 Chronic diastolic (congestive) heart failure: Secondary | ICD-10-CM

## 2017-04-28 DIAGNOSIS — I050 Rheumatic mitral stenosis: Secondary | ICD-10-CM | POA: Insufficient documentation

## 2017-04-28 DIAGNOSIS — I129 Hypertensive chronic kidney disease with stage 1 through stage 4 chronic kidney disease, or unspecified chronic kidney disease: Secondary | ICD-10-CM | POA: Insufficient documentation

## 2017-04-28 DIAGNOSIS — I350 Nonrheumatic aortic (valve) stenosis: Secondary | ICD-10-CM | POA: Insufficient documentation

## 2017-04-28 DIAGNOSIS — I2510 Atherosclerotic heart disease of native coronary artery without angina pectoris: Secondary | ICD-10-CM | POA: Insufficient documentation

## 2017-04-28 DIAGNOSIS — Z794 Long term (current) use of insulin: Secondary | ICD-10-CM | POA: Diagnosis not present

## 2017-04-28 DIAGNOSIS — E119 Type 2 diabetes mellitus without complications: Secondary | ICD-10-CM

## 2017-04-28 DIAGNOSIS — D649 Anemia, unspecified: Secondary | ICD-10-CM | POA: Diagnosis not present

## 2017-04-28 DIAGNOSIS — E1122 Type 2 diabetes mellitus with diabetic chronic kidney disease: Secondary | ICD-10-CM | POA: Insufficient documentation

## 2017-04-28 NOTE — Patient Instructions (Signed)
Medication Instructions:  Your physician recommends that you continue on your current medications as directed. Please refer to the Current Medication list given to you today.   Labwork: Lab work to be done today--BMP and CBC  Testing/Procedures: Your physician has requested that you have an echocardiogram. Echocardiography is a painless test that uses sound waves to create images of your heart. It provides your doctor with information about the size and shape of your heart and how well your heart's chambers and valves are working. This procedure takes approximately one hour. There are no restrictions for this procedure.  To be done in 11 months on day of appointment with K. Grandville Silos, PA  Follow-Up: Follow up as planned at the [facility].  The parking code for January is 9000  Follow up in 11 months.  with K. Grandville Silos, [place].  We will call you to schedule this appointment  Any Other Special Instructions Will Be Listed Below (If Applicable).     If you need a refill on your cardiac medications before your next appointment, please call your pharmacy.

## 2017-04-29 LAB — CBC
Hematocrit: 30.1 % — ABNORMAL LOW (ref 34.0–46.6)
Hemoglobin: 9.9 g/dL — ABNORMAL LOW (ref 11.1–15.9)
MCH: 29 pg (ref 26.6–33.0)
MCHC: 32.9 g/dL (ref 31.5–35.7)
MCV: 88 fL (ref 79–97)
PLATELETS: 81 10*3/uL — AB (ref 150–379)
RBC: 3.41 x10E6/uL — ABNORMAL LOW (ref 3.77–5.28)
RDW: 14.8 % (ref 12.3–15.4)
WBC: 5.7 10*3/uL (ref 3.4–10.8)

## 2017-04-29 LAB — BASIC METABOLIC PANEL
BUN/Creatinine Ratio: 40 — ABNORMAL HIGH (ref 12–28)
BUN: 100 mg/dL (ref 8–27)
CO2: 18 mmol/L — AB (ref 20–29)
Calcium: 9.5 mg/dL (ref 8.7–10.3)
Chloride: 102 mmol/L (ref 96–106)
Creatinine, Ser: 2.47 mg/dL — ABNORMAL HIGH (ref 0.57–1.00)
GFR calc Af Amer: 22 mL/min/{1.73_m2} — ABNORMAL LOW (ref >59)
GFR, EST NON AFRICAN AMERICAN: 19 mL/min/{1.73_m2} — AB (ref >59)
GLUCOSE: 191 mg/dL — AB (ref 65–99)
POTASSIUM: 4.9 mmol/L (ref 3.5–5.2)
SODIUM: 138 mmol/L (ref 134–144)

## 2017-05-06 ENCOUNTER — Ambulatory Visit (HOSPITAL_COMMUNITY)
Admit: 2017-05-06 | Discharge: 2017-05-06 | Disposition: A | Discharge status: Home / Self Care | Payer: Medicare HMO | Attending: Cardiology | Admitting: Cardiology

## 2017-05-06 ENCOUNTER — Encounter (HOSPITAL_COMMUNITY): Payer: Self-pay

## 2017-05-06 VITALS — BP 140/62 | HR 100 | Wt 109.4 lb

## 2017-05-06 DIAGNOSIS — Z79899 Other long term (current) drug therapy: Secondary | ICD-10-CM | POA: Diagnosis not present

## 2017-05-06 DIAGNOSIS — Z841 Family history of disorders of kidney and ureter: Secondary | ICD-10-CM | POA: Diagnosis not present

## 2017-05-06 DIAGNOSIS — K219 Gastro-esophageal reflux disease without esophagitis: Secondary | ICD-10-CM | POA: Diagnosis not present

## 2017-05-06 DIAGNOSIS — N184 Chronic kidney disease, stage 4 (severe): Secondary | ICD-10-CM

## 2017-05-06 DIAGNOSIS — E785 Hyperlipidemia, unspecified: Secondary | ICD-10-CM | POA: Insufficient documentation

## 2017-05-06 DIAGNOSIS — I050 Rheumatic mitral stenosis: Secondary | ICD-10-CM

## 2017-05-06 DIAGNOSIS — D509 Iron deficiency anemia, unspecified: Secondary | ICD-10-CM | POA: Diagnosis not present

## 2017-05-06 DIAGNOSIS — I5032 Chronic diastolic (congestive) heart failure: Secondary | ICD-10-CM

## 2017-05-06 DIAGNOSIS — Z794 Long term (current) use of insulin: Secondary | ICD-10-CM | POA: Insufficient documentation

## 2017-05-06 DIAGNOSIS — J441 Chronic obstructive pulmonary disease with (acute) exacerbation: Secondary | ICD-10-CM | POA: Diagnosis not present

## 2017-05-06 DIAGNOSIS — Z7722 Contact with and (suspected) exposure to environmental tobacco smoke (acute) (chronic): Secondary | ICD-10-CM | POA: Insufficient documentation

## 2017-05-06 DIAGNOSIS — I2510 Atherosclerotic heart disease of native coronary artery without angina pectoris: Secondary | ICD-10-CM | POA: Insufficient documentation

## 2017-05-06 DIAGNOSIS — Z8249 Family history of ischemic heart disease and other diseases of the circulatory system: Secondary | ICD-10-CM | POA: Diagnosis not present

## 2017-05-06 DIAGNOSIS — Z809 Family history of malignant neoplasm, unspecified: Secondary | ICD-10-CM | POA: Diagnosis not present

## 2017-05-06 DIAGNOSIS — E875 Hyperkalemia: Secondary | ICD-10-CM | POA: Insufficient documentation

## 2017-05-06 DIAGNOSIS — Z79891 Long term (current) use of opiate analgesic: Secondary | ICD-10-CM | POA: Insufficient documentation

## 2017-05-06 DIAGNOSIS — Z952 Presence of prosthetic heart valve: Secondary | ICD-10-CM

## 2017-05-06 DIAGNOSIS — I350 Nonrheumatic aortic (valve) stenosis: Secondary | ICD-10-CM | POA: Diagnosis not present

## 2017-05-06 DIAGNOSIS — I130 Hypertensive heart and chronic kidney disease with heart failure and stage 1 through stage 4 chronic kidney disease, or unspecified chronic kidney disease: Secondary | ICD-10-CM | POA: Insufficient documentation

## 2017-05-06 DIAGNOSIS — E559 Vitamin D deficiency, unspecified: Secondary | ICD-10-CM | POA: Diagnosis not present

## 2017-05-06 DIAGNOSIS — N179 Acute kidney failure, unspecified: Secondary | ICD-10-CM | POA: Diagnosis not present

## 2017-05-06 DIAGNOSIS — I10 Essential (primary) hypertension: Secondary | ICD-10-CM | POA: Diagnosis not present

## 2017-05-06 DIAGNOSIS — K449 Diaphragmatic hernia without obstruction or gangrene: Secondary | ICD-10-CM | POA: Diagnosis not present

## 2017-05-06 DIAGNOSIS — E1122 Type 2 diabetes mellitus with diabetic chronic kidney disease: Secondary | ICD-10-CM | POA: Diagnosis not present

## 2017-05-06 DIAGNOSIS — Z7982 Long term (current) use of aspirin: Secondary | ICD-10-CM | POA: Diagnosis not present

## 2017-05-06 DIAGNOSIS — E1165 Type 2 diabetes mellitus with hyperglycemia: Secondary | ICD-10-CM | POA: Insufficient documentation

## 2017-05-06 DIAGNOSIS — Z833 Family history of diabetes mellitus: Secondary | ICD-10-CM | POA: Insufficient documentation

## 2017-05-06 DIAGNOSIS — D649 Anemia, unspecified: Secondary | ICD-10-CM | POA: Diagnosis not present

## 2017-05-06 LAB — BASIC METABOLIC PANEL
ANION GAP: 9 (ref 5–15)
BUN: 92 mg/dL — ABNORMAL HIGH (ref 6–20)
CHLORIDE: 108 mmol/L (ref 101–111)
CO2: 23 mmol/L (ref 22–32)
Calcium: 10.4 mg/dL — ABNORMAL HIGH (ref 8.9–10.3)
Creatinine, Ser: 3.02 mg/dL — ABNORMAL HIGH (ref 0.44–1.00)
GFR calc Af Amer: 17 mL/min — ABNORMAL LOW (ref >60)
GFR calc non Af Amer: 15 mL/min — ABNORMAL LOW (ref >60)
Glucose, Bld: 71 mg/dL (ref 65–99)
Potassium: 3.8 mmol/L (ref 3.5–5.1)
Sodium: 140 mmol/L (ref 135–145)

## 2017-05-06 LAB — BRAIN NATRIURETIC PEPTIDE: B Natriuretic Peptide: 181.7 pg/mL — ABNORMAL HIGH (ref 0.0–100.0)

## 2017-05-06 NOTE — Progress Notes (Signed)
Advanced Heart Failure Clinic Note   Primary Care Provider: Michell Heinrich Primary Cardiologist: Dr.Zachary Angelina Sheriff, [place]) / Dr. Burt Knack &Dr. Roxy Manns (TAVR) Primary HF: Dr. Haroldine Laws  HPI:  Sonya Carpenter is a 72 y.o. 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  PT sent from cardiology clinic to [facility] ED on 04/14/17 for admission for worsening SOB, wheezing and acute groin pain.Volume status improved quickly with IV diuresis, but hospital course complicated by AKI. Pt also treated for AECOPD with nebs, steroids, and doxy. [place] negative at R groin, also evaluated by Dr. Roxy Manns and felt stable. PT had some difficulty with hyperglycemia on steroids, but improved with taper. Discharged back to SNF with on-going deconditioning.   Pt presents today for post hospital follow up. Feeling well overall. Remains in facility. Working daily with PT. Denies SOB with ADLs. No problem getting dressed or bathing. She is to be discharged from SNF to home tomorrow.  She lives by herself but has an "uncle" that helps her occasionally. He is older than her. She has itching in her feet, but only mild swelling at times. Lasix seems to work well. Denies orthopnea. Taking all medications as directed.   Review of systems complete and found to be negative unless listed in HPI.    Past Medical History:  Diagnosis Date  . Anemia   . Aortic stenosis   . Arthritis    back (04/14/2017)  . Asthma   . CAD (coronary artery disease)   . Chronic bronchitis ([place])   . Chronic diastolic congestive heart failure ([place])   . CKD (chronic kidney disease), stage IV ([place])   . COPD (chronic obstructive pulmonary disease) ([place])   . Gallbladder disease   . GERD (gastroesophageal reflux disease)   . History of blood transfusion  "several times"   "low HgB"  . History of hiatal hernia   . Hyperlipidemia   . Hypertension   . Mitral stenosis   . 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 Type II diabetes mellitus ([place])   . Vitamin D deficiency     Current Outpatient Medications  Medication Sig Dispense Refill  . acetaminophen (TYLENOL) 500 MG tablet Take 500 mg by mouth every 6 (six) hours as needed for mild pain or moderate pain.    Marland Kitchen albuterol (PROVENTIL HFA;VENTOLIN HFA) 108 (90 Base) MCG/ACT inhaler Inhale 2 puffs every 6 (six) hours as needed into the lungs for wheezing or shortness of breath.     Marland Kitchen amLODipine (NORVASC) 5 MG tablet Take 1 tablet (5 mg total) by mouth daily. 30 tablet 6  . aspirin EC 81 MG EC tablet Take 1 tablet (81 mg total) by mouth daily.    Marland Kitchen atorvastatin (LIPITOR) 10 MG tablet Take 10 mg by mouth at bedtime.    . budesonide-formoterol (SYMBICORT) 160-4.5 MCG/ACT inhaler Inhale 2 puffs into the lungs daily as needed (for shortness of breath).     . cetirizine (ZYRTEC) 10 MG tablet Take 10 mg at bedtime by mouth.     . chlorhexidine (PERIDEX) 0.12 % solution Use as directed 15 mLs in the mouth or throat 2 (two) times daily. 480 mL prn  . docusate sodium (COLACE) 100 MG capsule Take 100 mg by mouth every  12 (twelve) hours as needed for mild constipation.     . feeding supplement, ENSURE ENLIVE, (ENSURE ENLIVE) LIQD Take 237 mLs by mouth 2 (two) times daily between meals. 237 mL 12  . furosemide (LASIX) 40 MG tablet Take 1 tablet (40 mg total) by mouth daily. 30 tablet 6  . insulin aspart (NOVOLOG) 100 UNIT/ML injection Inject 0-24 Units into the skin every 4 (four) hours. (Patient taking differently: Inject 6 Units into the skin 2 (two) times daily. ) 10 mL 11  . insulin glargine (LANTUS) 100 UNIT/ML injection Inject 0.08 mLs (8 Units total) into the skin daily. (Patient taking  differently: Inject 30 Units into the skin at bedtime. ) 10 mL 11  . ipratropium (ATROVENT) 0.02 % nebulizer solution Take 0.5 mg by nebulization 2 (two) times daily.    Marland Kitchen lubiprostone (AMITIZA) 8 MCG capsule Take 8 mcg by mouth every 12 (twelve) hours as needed for constipation.     . montelukast (SINGULAIR) 10 MG tablet Take 10 mg by mouth at bedtime.    . Multiple Vitamins-Minerals (MULTIVITAMIN WITH MINERALS) tablet Take 1 tablet by mouth daily.    . pantoprazole (PROTONIX) 40 MG tablet Take 1 tablet (40 mg total) by mouth 2 (two) times daily before a meal. 60 tablet 1  . Polyvinyl Alcohol-Povidone (CLEAR EYES NATURAL TEARS) 5-6 MG/ML SOLN Place 2 drops into both eyes 2 (two) times daily.    . potassium chloride SA (K-DUR,KLOR-CON) 10 MEQ tablet Take 1 tablet (10 mEq total) by mouth daily. 30 tablet 0  . Propylene Glycol-Glycerin (ARTIFICIAL TEARS) 1-0.3 % SOLN Place 2 drops into both eyes daily as needed (for dry eyes).    . sodium chloride (OCEAN) 0.65 % SOLN nasal spray Place 2 sprays into both nostrils every 12 (twelve) hours as needed for congestion.     . sucralfate (CARAFATE) 1 GM/10ML suspension Take 10 mLs (1 g total) by mouth 3 (three) times daily with meals. 420 mL 0  . traMADol (ULTRAM) 50 MG tablet Take 1 tablet (50 mg total) by mouth every 12 (twelve) hours as needed for moderate pain. 14 tablet 0   No current facility-administered medications for this encounter.     No Known Allergies    Social History   Socioeconomic History  . Marital status: Widowed    Spouse name: Not on file  . Number of children: 0  . Years of education: Not on file  . Highest education level: Not on file  Social Needs  . Financial resource strain: Not on file  . Food insecurity - worry: Not on file  . Food insecurity - inability: Not on file  . Transportation needs - medical: Not on file  . Transportation needs - non-medical: Not on file  Occupational History  . 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 Smoking status: Passive Smoke Exposure - Never Smoker  . Smokeless tobacco: Never Used  Substance and Sexual Activity  . Alcohol use: No    Frequency: Never  . Drug use: No  . Sexual activity: No  Other Topics Concern  . Not on file  Social History Narrative   Here with Uncle and [place].      Family History  Problem Relation Age of Onset  . Hypertension Mother   . Kidney failure Mother   . Heart disease Mother   .  Hypertension Father   . Heart disease Father   . Diabetes Father   . Cancer Sister     Vitals:   05/06/17 0902  BP: 140/62  Pulse: 100  SpO2: 97%  Weight: 109 lb 6.4 oz (49.6 kg)   Wt Readings from Last 3 Encounters:  05/06/17 109 lb 6.4 oz (49.6 kg)  04/28/17 106 lb 9.6 oz (48.4 kg)  04/21/17 97 lb 1.6 oz (44 kg)   PHYSICAL EXAM: General:  Appears older than stated age. NAD.  HEENT: normal Neck: supple. no JVD. Carotids 2+ bilat; no bruits. No lymphadenopathy or thyromegaly appreciated. Cor: PMI nondisplaced. Regular rate & rhythm. 2/6 MS murmur.  Lungs: CTAB, normal effort.  Abdomen: soft, nontender, nondistended. No hepatosplenomegaly. No bruits or masses. Good bowel sounds. Extremities: no cyanosis or clubbing. Trace ankle edema with excoriations.  Neuro: alert & oriented x 3, cranial nerves grossly intact. moves all 4 extremities w/o difficulty. Affect pleasant.  ASSESSMENT & PLAN:  1. A/C Diastolic Heart Failure  - Echo 04/28/17 LVEF 65-70%, grade 1 DD, Stable TAVR, Severe MS, Severe LAE - NYHA III symptoms chronically, complicated by COPD.  - Volume status looks stable on exam.  - Continue lasix 40 mg daily. Can take extra 40 mg in evening as needed. BMET/BNP today.  - Reinforced fluid restriction to < 2 L daily, sodium restriction to less than 2000 mg daily, and the importance of daily weights.     2. COPD - Continue home meds. Finished steroid taper.  - Not on chronic O2.  3. R groin pain-  - Resolved. - Ultrasound 04/15/18 negative. Dr Roxy Manns evaluated.   4. S/P TAVR 12/5:  - Stable on echo 04/28/17 - Continue ASA as tolerated.   5. H/O GI Bleed - Recent EGD/colonoscopy in [place] was unremarkable,  6. DMII - Per PCP/Facility.  7. AKI on CKD IV - BMET today. Baseline seems to be 2.4-2.6  8. Hyperkalemia - Had resolved at discharge. BMET today.   9. Severe deconditioning - Remains at SNF.   10. HTN - Mildly elevated today, but running 110-120 at facility.  - Continue Amlodipine 5 mg daily for now. Also want to leave room for renal perfusion.   Shirley Friar, PA-C 05/06/17   Greater than 50% of the 30 minute visit was spent in counseling/coordination of care regarding disease state education, salt/fluid restriction, sliding scale diuretics, and medication compliance.

## 2017-05-17 ENCOUNTER — Encounter (HOSPITAL_COMMUNITY): Payer: Self-pay | Admitting: Cardiology

## 2017-05-17 ENCOUNTER — Encounter: Payer: Self-pay | Admitting: Thoracic Surgery (Cardiothoracic Vascular Surgery)

## 2017-06-02 ENCOUNTER — Other Ambulatory Visit (HOSPITAL_COMMUNITY): Payer: Self-pay | Admitting: Internal Medicine

## 2017-06-03 ENCOUNTER — Encounter (HOSPITAL_COMMUNITY): Payer: Self-pay

## 2017-06-03 ENCOUNTER — Ambulatory Visit (HOSPITAL_COMMUNITY)
Admission: RE | Admit: 2017-06-03 | Discharge: 2017-06-03 | Disposition: A | Discharge status: Home / Self Care | Payer: Medicare HMO | Source: Ambulatory Visit | Attending: Cardiology | Admitting: Cardiology

## 2017-06-03 VITALS — BP 144/86 | HR 102 | Wt 108.2 lb

## 2017-06-03 DIAGNOSIS — Z7722 Contact with and (suspected) exposure to environmental tobacco smoke (acute) (chronic): Secondary | ICD-10-CM | POA: Diagnosis not present

## 2017-06-03 DIAGNOSIS — E875 Hyperkalemia: Secondary | ICD-10-CM | POA: Insufficient documentation

## 2017-06-03 DIAGNOSIS — N184 Chronic kidney disease, stage 4 (severe): Secondary | ICD-10-CM | POA: Diagnosis not present

## 2017-06-03 DIAGNOSIS — K219 Gastro-esophageal reflux disease without esophagitis: Secondary | ICD-10-CM | POA: Diagnosis not present

## 2017-06-03 DIAGNOSIS — I130 Hypertensive heart and chronic kidney disease with heart failure and stage 1 through stage 4 chronic kidney disease, or unspecified chronic kidney disease: Secondary | ICD-10-CM | POA: Insufficient documentation

## 2017-06-03 DIAGNOSIS — R1031 Right lower quadrant pain: Secondary | ICD-10-CM | POA: Insufficient documentation

## 2017-06-03 DIAGNOSIS — Z7982 Long term (current) use of aspirin: Secondary | ICD-10-CM | POA: Insufficient documentation

## 2017-06-03 DIAGNOSIS — E1165 Type 2 diabetes mellitus with hyperglycemia: Secondary | ICD-10-CM | POA: Diagnosis not present

## 2017-06-03 DIAGNOSIS — Z7951 Long term (current) use of inhaled steroids: Secondary | ICD-10-CM | POA: Insufficient documentation

## 2017-06-03 DIAGNOSIS — K922 Gastrointestinal hemorrhage, unspecified: Secondary | ICD-10-CM | POA: Diagnosis not present

## 2017-06-03 DIAGNOSIS — E1122 Type 2 diabetes mellitus with diabetic chronic kidney disease: Secondary | ICD-10-CM | POA: Insufficient documentation

## 2017-06-03 DIAGNOSIS — E559 Vitamin D deficiency, unspecified: Secondary | ICD-10-CM | POA: Insufficient documentation

## 2017-06-03 DIAGNOSIS — I5033 Acute on chronic diastolic (congestive) heart failure: Secondary | ICD-10-CM | POA: Diagnosis present

## 2017-06-03 DIAGNOSIS — Z952 Presence of prosthetic heart valve: Secondary | ICD-10-CM | POA: Diagnosis not present

## 2017-06-03 DIAGNOSIS — E785 Hyperlipidemia, unspecified: Secondary | ICD-10-CM | POA: Diagnosis not present

## 2017-06-03 DIAGNOSIS — Z794 Long term (current) use of insulin: Secondary | ICD-10-CM | POA: Diagnosis not present

## 2017-06-03 DIAGNOSIS — I5032 Chronic diastolic (congestive) heart failure: Secondary | ICD-10-CM

## 2017-06-03 DIAGNOSIS — I050 Rheumatic mitral stenosis: Secondary | ICD-10-CM

## 2017-06-03 DIAGNOSIS — D649 Anemia, unspecified: Secondary | ICD-10-CM | POA: Insufficient documentation

## 2017-06-03 DIAGNOSIS — J449 Chronic obstructive pulmonary disease, unspecified: Secondary | ICD-10-CM | POA: Insufficient documentation

## 2017-06-03 DIAGNOSIS — Z79899 Other long term (current) drug therapy: Secondary | ICD-10-CM | POA: Insufficient documentation

## 2017-06-03 DIAGNOSIS — I2510 Atherosclerotic heart disease of native coronary artery without angina pectoris: Secondary | ICD-10-CM | POA: Insufficient documentation

## 2017-06-03 DIAGNOSIS — I350 Nonrheumatic aortic (valve) stenosis: Secondary | ICD-10-CM

## 2017-06-03 DIAGNOSIS — I10 Essential (primary) hypertension: Secondary | ICD-10-CM

## 2017-06-03 LAB — BASIC METABOLIC PANEL
ANION GAP: 15 (ref 5–15)
BUN: 98 mg/dL — AB (ref 6–20)
CHLORIDE: 112 mmol/L — AB (ref 101–111)
CO2: 14 mmol/L — AB (ref 22–32)
Calcium: 10.5 mg/dL — ABNORMAL HIGH (ref 8.9–10.3)
Creatinine, Ser: 2.83 mg/dL — ABNORMAL HIGH (ref 0.44–1.00)
GFR calc Af Amer: 18 mL/min — ABNORMAL LOW (ref >60)
GFR, EST NON AFRICAN AMERICAN: 16 mL/min — AB (ref >60)
GLUCOSE: 123 mg/dL — AB (ref 65–99)
POTASSIUM: 3.8 mmol/L (ref 3.5–5.1)
Sodium: 141 mmol/L (ref 135–145)

## 2017-06-03 NOTE — Patient Instructions (Signed)
Routine lab work today. Will notify you of abnormal results, otherwise no news is good news!  Take Lasix 40 mg TWICE daily for TWO DAYS. Then reduce back to normal daily dose of 40 mg ONCE daily.  Follow up 2 months with Dr. Haroldine Laws.  ________________________________________________________ Sonya Carpenter Code: 1100  Take all medication as prescribed the day of your appointment. Bring all medications with you to your appointment.  Do the following things EVERYDAY: 1) Weigh yourself in the morning before breakfast. Write it down and keep it in a log. 2) Take your medicines as prescribed 3) Eat low salt foods-Limit salt (sodium) to 2000 mg per day.  4) Stay as active as you can everyday 5) Limit all fluids for the day to less than 2 liters

## 2017-06-03 NOTE — Progress Notes (Signed)
Advanced Heart Failure Clinic Note   Primary Care Provider: Michell Heinrich Primary Cardiologist: Dr.Zachary Angelina Sheriff, [place]) / Dr. Burt Knack &Dr. Roxy Manns (TAVR) Primary HF: Dr. Haroldine Laws  HPI:  Sonya Carpenter is a 72 y.o. 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  PT sent from cardiology clinic to [facility] ED on 04/14/17 for admission for worsening SOB, wheezing and acute groin pain.Volume status improved quickly with IV diuresis, but hospital course complicated by AKI. Pt also treated for AECOPD with nebs, steroids, and doxy. [place] negative at R groin, also evaluated by Dr. Roxy Manns and felt stable. PT had some difficulty with hyperglycemia on steroids, but improved with taper. Discharged back to SNF with on-going deconditioning.   Pt presents today for regular follow up. Doing well overall. Treated for URI last week. Remains on prednisone. Blood sugar running low this am (57). She lives by herself, and her uncle helps her on occasion. She has been more swollen over the past several days on prednisone. Taking all medications as directed. She denies SOB getting dressed or bathing.   Review of systems complete and found to be negative unless listed in HPI.    Past Medical History:  Diagnosis Date  . Anemia   . Aortic stenosis   . Arthritis    back (04/14/2017)  . Asthma   . CAD (coronary artery disease)   . Chronic bronchitis ([place])   . Chronic diastolic congestive heart failure ([place])   . CKD (chronic kidney disease), stage IV ([place])   . COPD (chronic obstructive pulmonary disease) ([place])   . Gallbladder disease   . GERD (gastroesophageal reflux disease)   . History of blood transfusion "several times"   "low HgB"  . History of hiatal hernia   . Hyperlipidemia   . Hypertension   . Mitral  stenosis   . 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 Type II diabetes mellitus ([place])   . Vitamin D deficiency     Current Outpatient Medications  Medication Sig Dispense Refill  . acetaminophen (TYLENOL) 500 MG tablet Take 500 mg by mouth every 6 (six) hours as needed for mild pain or moderate pain.    Marland Kitchen albuterol (PROVENTIL HFA;VENTOLIN HFA) 108 (90 Base) MCG/ACT inhaler Inhale 2 puffs every 6 (six) hours as needed into the lungs for wheezing or shortness of breath.     Marland Kitchen amLODipine (NORVASC) 5 MG tablet Take 1 tablet (5 mg total) by mouth daily. 30 tablet 6  . aspirin EC 81 MG EC tablet Take 1 tablet (81 mg total) by mouth daily.    Marland Kitchen atorvastatin (LIPITOR) 10 MG tablet Take 10 mg by mouth at bedtime.    . budesonide-formoterol (SYMBICORT) 160-4.5 MCG/ACT inhaler Inhale 2 puffs into the lungs daily as needed (for shortness of breath).     . cetirizine (ZYRTEC) 10 MG tablet Take 10 mg at bedtime by mouth.     . chlorhexidine (PERIDEX) 0.12 % solution Use as directed 15 mLs in the mouth or throat 2 (two) times daily. 480 mL prn  . docusate sodium (COLACE) 100 MG capsule Take 100 mg by mouth every 12 (twelve) hours as needed for mild constipation.     . feeding supplement, ENSURE ENLIVE, (ENSURE ENLIVE) LIQD  Take 237 mLs by mouth 2 (two) times daily between meals. 237 mL 12  . furosemide (LASIX) 40 MG tablet Take 1 tablet (40 mg total) by mouth daily. 30 tablet 6  . insulin aspart (NOVOLOG) 100 UNIT/ML injection Inject 0-24 Units into the skin every 4 (four) hours. (Patient taking differently: Inject 6 Units into the skin 2 (two) times daily. ) 10 mL 11  . insulin glargine (LANTUS) 100 UNIT/ML injection Inject 0.08 mLs (8 Units total) into the skin daily. (Patient taking differently: Inject 30 Units into the skin at bedtime. ) 10 mL 11  . ipratropium (ATROVENT) 0.02 % nebulizer  solution Take 0.5 mg by nebulization 2 (two) times daily.    Marland Kitchen lubiprostone (AMITIZA) 8 MCG capsule Take 8 mcg by mouth every 12 (twelve) hours as needed for constipation.     . montelukast (SINGULAIR) 10 MG tablet Take 10 mg by mouth at bedtime.    . Multiple Vitamins-Minerals (MULTIVITAMIN WITH MINERALS) tablet Take 1 tablet by mouth daily.    . pantoprazole (PROTONIX) 40 MG tablet Take 1 tablet (40 mg total) by mouth 2 (two) times daily before a meal. 60 tablet 1  . Polyvinyl Alcohol-Povidone (CLEAR EYES NATURAL TEARS) 5-6 MG/ML SOLN Place 2 drops into both eyes 2 (two) times daily.    . potassium chloride SA (K-DUR,KLOR-CON) 10 MEQ tablet Take 1 tablet (10 mEq total) by mouth daily. 30 tablet 0  . Propylene Glycol-Glycerin (ARTIFICIAL TEARS) 1-0.3 % SOLN Place 2 drops into both eyes daily as needed (for dry eyes).    . sodium chloride (OCEAN) 0.65 % SOLN nasal spray Place 2 sprays into both nostrils every 12 (twelve) hours as needed for congestion.     . sucralfate (CARAFATE) 1 GM/10ML suspension Take 10 mLs (1 g total) by mouth 3 (three) times daily with meals. 420 mL 0  . traMADol (ULTRAM) 50 MG tablet Take 1 tablet (50 mg total) by mouth every 12 (twelve) hours as needed for moderate pain. 14 tablet 0   No current facility-administered medications for this encounter.     No Known Allergies    Social History   Socioeconomic History  . Marital status: Widowed    Spouse name: Not on file  . Number of children: 0  . Years of education: Not on file  . Highest education level: Not on file  Social Needs  . Financial resource strain: Not on file  . Food insecurity - worry: Not on file  . Food insecurity - inability: Not on file  . Transportation needs - medical: Not on file  . Transportation needs - non-medical: Not on file  Occupational History  . 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 Smoking status: Passive Smoke Exposure - Never Smoker  . Smokeless tobacco: Never Used  Substance and Sexual Activity  . Alcohol use: No    Frequency: Never  . Drug use: No  . Sexual activity: No  Other Topics Concern  . Not on file  Social History Narrative   Here with Uncle and [place].      Family History  Problem Relation Age of Onset  . Hypertension Mother   . Kidney failure Mother   . Heart disease Mother   . Hypertension Father   . Heart disease Father   . Diabetes Father   . Cancer Sister  Vitals:   06/03/17 1109  BP: (!) 144/86  Pulse: (!) 102  SpO2: 97%  Weight: 108 lb 3.2 oz (49.1 kg)   Wt Readings from Last 3 Encounters:  06/03/17 108 lb 3.2 oz (49.1 kg)  05/06/17 109 lb 6.4 oz (49.6 kg)  04/28/17 106 lb 9.6 oz (48.4 kg)   PHYSICAL EXAM: General: Elderly appearing. NAD.  HEENT: Normal Neck: Supple. JVP 7-8 cm. Carotids 2+ bilat; no bruits. No thyromegaly or nodule noted. Cor: PMI nondisplaced. RRR, 2/6 MS murmur. Lungs: CTAB, normal effort. Abdomen: Soft, non-tender, non-distended, no HSM. No bruits or masses. +BS  Extremities: No cyanosis, clubbing, or rash. 1+ edema BLE.   Neuro: Alert & orientedx3, cranial nerves grossly intact. moves all 4 extremities w/o difficulty. Affect pleasant    ASSESSMENT & PLAN:  1. A/C Diastolic Heart Failure  - Echo 04/28/17 LVEF 65-70%, grade 1 DD, Stable TAVR, Severe MS, Severe LAE - NYHA III symptoms chronically.  - Volume status looks mildly elevated on exam - Continue lasix 40 mg daily, but take 40 mg in the evening x 2 days.  - Reinforced fluid restriction to < 2 L daily, sodium restriction to less than 2000 mg daily, and the importance of daily weights.    2. COPD - Continue home meds. Finished steroid taper.  - Not on chronic O2.No change.   3. R groin pain-  - Resolved.  - Ultrasound 04/15/18 negative. Dr Roxy Manns evaluated.   4. S/P TAVR 12/5:  - Stable on echo  04/28/17 No change.  - Continue ASA as tolerated.   5. H/O GI Bleed - Recent EGD/colonoscopy in [place] was unremarkable,  6. DMII - Per PCP.   7. CKD IV - BMET today.   8. Hyperkalemia - BMET today.   9. Severe deconditioning - Now back at home with [facility].   10. HTN - Mildly elevated today, but on prednisone.  - Continue Amlodipine 5 mg daily.   RTC 2 months. Labs today.   Shirley Friar, PA-C 06/03/17   Greater than 50% of the 25 minute visit was spent in counseling/coordination of care regarding disease state education, salt/fluid restriction, sliding scale diuretics, and medication compliance.

## 2017-06-04 ENCOUNTER — Other Ambulatory Visit: Payer: Self-pay | Admitting: Physician Assistant

## 2017-06-25 ENCOUNTER — Other Ambulatory Visit: Payer: Self-pay | Admitting: Physician Assistant

## 2017-06-28 ENCOUNTER — Other Ambulatory Visit: Payer: Self-pay

## 2017-06-28 MED ORDER — PANTOPRAZOLE SODIUM 40 MG PO TBEC: 40.0000 mg | DELAYED_RELEASE_TABLET | Freq: Two times a day (BID) | ORAL | 2 refills | Status: DC

## 2017-07-14 ENCOUNTER — Telehealth: Payer: Self-pay

## 2017-07-14 NOTE — Telephone Encounter (Signed)
The pt contacted me in regards to developing a cough over the past week.  The pt denies swelling in her abdomen/lower extremities. The pt's weight had increased by 2-3 lbs and the pt took an extra lasix for 3 days (resumed normal dosage yesterday) and her weight is back to baseline today. The pt c/o SOB with exertion and rest and states that this is her "normal".  The pt did cough multiple times while on the phone and her cough is dry.  The pt has COPD and uses inhalers and nebulizer as prescribed.  I made the pt aware that I will forward this message to CHF clinic for review.  The pt has a pending apt 08/04/2017 with Dr Haroldine Laws.

## 2017-07-14 NOTE — Telephone Encounter (Signed)
May be due to fluid excess, would not make any changes currently.

## 2017-07-15 NOTE — Telephone Encounter (Signed)
Attempted to reach the pt but no answer and her mailbox is full.

## 2017-07-15 NOTE — Telephone Encounter (Signed)
I spoke with the pt and made her aware that no medication change is recommended at this time.  The pt advised to continue normal dosage of Furosemide, weigh daily, avoid foods/drinks that are high in sodium and keep scheduled follow-up with Dr Haroldine Laws.  The pt will contact the office with any additional questions or concerns if symptoms worsen or change. Pt agreed with plan.

## 2017-08-01 ENCOUNTER — Encounter (HOSPITAL_COMMUNITY): Payer: Self-pay | Admitting: Dentistry

## 2017-08-01 ENCOUNTER — Ambulatory Visit (HOSPITAL_COMMUNITY): Payer: Medicare HMO | Admitting: Dentistry

## 2017-08-01 VITALS — BP 148/66 | HR 84 | Temp 98.6°F

## 2017-08-01 DIAGNOSIS — K0820 Unspecified atrophy of edentulous alveolar ridge: Secondary | ICD-10-CM

## 2017-08-01 DIAGNOSIS — Z952 Presence of prosthetic heart valve: Secondary | ICD-10-CM

## 2017-08-01 DIAGNOSIS — K08109 Complete loss of teeth, unspecified cause, unspecified class: Secondary | ICD-10-CM

## 2017-08-01 DIAGNOSIS — K08199 Complete loss of teeth due to other specified cause, unspecified class: Secondary | ICD-10-CM

## 2017-08-01 NOTE — Patient Instructions (Signed)
PLAN: 1.  Use salt water rinses as needed.   2.  Brush tongue daily.   3.  Advance diet as tolerated to soft diet.  Use nutritional supplementation as needed.   4.  Patient to follow-up with a dentist of her choice for fabrication of upper and lower complete dentures with and without implants.  Suggested follow-up with prosthodontist in [facility] or consideration of follow-up with [facility] in [place], [place].  The patient will need release of information for her records before these records can be sent.  The patient was given a hard copy of her orthopantogram-today.    Lenn Cal, DDS

## 2017-08-01 NOTE — Progress Notes (Signed)
POST OPERATIVE NOTE:  08/01/2017 Sonya Carpenter [id number]  VITALS: BP (!) 148/66 (BP Location: Left Arm)   Pulse 84   Temp 98.6 F (37 C)   LABS:  Lab Results  Component Value Date   WBC 5.7 04/28/2017   HGB 9.9 (L) 04/28/2017   HCT 30.1 (L) 04/28/2017   MCV 88 04/28/2017   PLT 81 (LL) 04/28/2017   BMET    Component Value Date/Time   NA 141 06/03/2017 1146   NA 138 04/28/2017 1432   K 3.8 06/03/2017 1146   CL 112 (H) 06/03/2017 1146   CO2 14 (L) 06/03/2017 1146   GLUCOSE 123 (H) 06/03/2017 1146   BUN 98 (H) 06/03/2017 1146   BUN 100 (HH) 04/28/2017 1432   CREATININE 2.83 (H) 06/03/2017 1146   CALCIUM 10.5 (H) 06/03/2017 1146   GFRNONAA 16 (L) 06/03/2017 1146   GFRAA 18 (L) 06/03/2017 1146    Lab Results  Component Value Date   INR 1.31 03/29/2017   INR 1.17 03/28/2017   INR 1.09 03/22/2017   No results found for: PTT   Sonya Carpenter is status post extraction of remaining teeth with alveoloplasty and pre-prosthetic surgery as needed in the operating room on 03/04/2017.  The patient then had transcatheter aortic valve replacement on 03/29/2017 with Dr. Roxy Manns and Dr. Burt Knack.  The patient now presents for reevaluation of healing.  SUBJECTIVE: Patient denies having any problems with her healing.  Patient is interested in proceeding with upper and lower complete denture fabrication.  EXAM: Patient has healed in well.  There is no evidence of exposed bone.  There is atrophy of the edentulous alveolar ridges.  Patient has normal saliva.  Patient appears to have adequate bone for denture fabrication.  Orthopantogram was taken today.  There is no evidence of retained roots or root tips.  The patient is edentulous.  ASSESSMENT: Post operative course is consistent with dental procedures performed in the operating room on 03/04/2017. Patient is edentulous There is atrophy of the edentulous alveolar ridges.  PLAN: 1.  Use salt water rinses as needed.   2.  Brush tongue  daily.   3.  Advance diet as tolerated to soft diet.  Use nutritional supplementation as needed.   4.  Patient to follow-up with a dentist of her choice for fabrication of upper and lower complete dentures with and without implants.  Suggested follow-up with prosthodontist in [facility] or consideration of follow-up with [facility] in [place], [place].  The patient will need release of information for her records before these records can be sent.  The patient was given a hard copy of her orthopantogram-today.    Lenn Cal, DDS

## 2017-08-04 ENCOUNTER — Encounter (HOSPITAL_COMMUNITY): Payer: Self-pay | Admitting: Internal Medicine

## 2017-08-04 ENCOUNTER — Ambulatory Visit (HOSPITAL_COMMUNITY)
Admission: RE | Admit: 2017-08-04 | Discharge: 2017-08-04 | Disposition: A | Discharge status: Home / Self Care | Payer: Medicare HMO | Source: Ambulatory Visit | Attending: Internal Medicine | Admitting: Internal Medicine

## 2017-08-04 VITALS — BP 124/68 | HR 78 | Wt 109.1 lb

## 2017-08-04 DIAGNOSIS — E785 Hyperlipidemia, unspecified: Secondary | ICD-10-CM | POA: Insufficient documentation

## 2017-08-04 DIAGNOSIS — K219 Gastro-esophageal reflux disease without esophagitis: Secondary | ICD-10-CM | POA: Diagnosis not present

## 2017-08-04 DIAGNOSIS — Z7982 Long term (current) use of aspirin: Secondary | ICD-10-CM | POA: Diagnosis not present

## 2017-08-04 DIAGNOSIS — Z952 Presence of prosthetic heart valve: Secondary | ICD-10-CM | POA: Insufficient documentation

## 2017-08-04 DIAGNOSIS — I5032 Chronic diastolic (congestive) heart failure: Secondary | ICD-10-CM | POA: Diagnosis present

## 2017-08-04 DIAGNOSIS — I050 Rheumatic mitral stenosis: Secondary | ICD-10-CM | POA: Diagnosis not present

## 2017-08-04 DIAGNOSIS — Z7722 Contact with and (suspected) exposure to environmental tobacco smoke (acute) (chronic): Secondary | ICD-10-CM | POA: Diagnosis not present

## 2017-08-04 DIAGNOSIS — K449 Diaphragmatic hernia without obstruction or gangrene: Secondary | ICD-10-CM | POA: Insufficient documentation

## 2017-08-04 DIAGNOSIS — Z01810 Encounter for preprocedural cardiovascular examination: Secondary | ICD-10-CM

## 2017-08-04 DIAGNOSIS — I350 Nonrheumatic aortic (valve) stenosis: Secondary | ICD-10-CM

## 2017-08-04 DIAGNOSIS — E559 Vitamin D deficiency, unspecified: Secondary | ICD-10-CM | POA: Diagnosis not present

## 2017-08-04 DIAGNOSIS — Z79899 Other long term (current) drug therapy: Secondary | ICD-10-CM | POA: Insufficient documentation

## 2017-08-04 DIAGNOSIS — N184 Chronic kidney disease, stage 4 (severe): Secondary | ICD-10-CM | POA: Insufficient documentation

## 2017-08-04 DIAGNOSIS — Z794 Long term (current) use of insulin: Secondary | ICD-10-CM | POA: Diagnosis not present

## 2017-08-04 DIAGNOSIS — E1165 Type 2 diabetes mellitus with hyperglycemia: Secondary | ICD-10-CM | POA: Diagnosis not present

## 2017-08-04 DIAGNOSIS — D649 Anemia, unspecified: Secondary | ICD-10-CM | POA: Insufficient documentation

## 2017-08-04 DIAGNOSIS — E1122 Type 2 diabetes mellitus with diabetic chronic kidney disease: Secondary | ICD-10-CM | POA: Diagnosis not present

## 2017-08-04 DIAGNOSIS — K921 Melena: Secondary | ICD-10-CM | POA: Insufficient documentation

## 2017-08-04 DIAGNOSIS — N179 Acute kidney failure, unspecified: Secondary | ICD-10-CM | POA: Diagnosis not present

## 2017-08-04 DIAGNOSIS — I130 Hypertensive heart and chronic kidney disease with heart failure and stage 1 through stage 4 chronic kidney disease, or unspecified chronic kidney disease: Secondary | ICD-10-CM | POA: Diagnosis present

## 2017-08-04 DIAGNOSIS — I080 Rheumatic disorders of both mitral and aortic valves: Secondary | ICD-10-CM | POA: Diagnosis not present

## 2017-08-04 DIAGNOSIS — J441 Chronic obstructive pulmonary disease with (acute) exacerbation: Secondary | ICD-10-CM | POA: Diagnosis not present

## 2017-08-04 DIAGNOSIS — E875 Hyperkalemia: Secondary | ICD-10-CM | POA: Diagnosis not present

## 2017-08-04 NOTE — Patient Instructions (Signed)
Follow up as needed

## 2017-08-04 NOTE — Progress Notes (Signed)
Advanced Heart Failure Clinic Note   Primary Care Provider: Michell Heinrich Primary Cardiologist: Dr.Zachary Angelina Sheriff, [place]) / Dr. Burt Knack &Dr. Roxy Manns (TAVR) Primary HF: Dr. Haroldine Laws  HPI:  Sonya Carpenter is a 72 y.o. 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  PT sent from cardiology clinic to [facility] ED on 04/14/17 for admission for worsening SOB, wheezing and acute groin pain.Volume status improved quickly with IV diuresis, but hospital course complicated by AKI. Pt also treated for AECOPD with nebs, steroids, and doxy. [place] negative at R groin, also evaluated by Dr. Roxy Manns and felt stable. PT had some difficulty with hyperglycemia on steroids, but improved with taper. Discharged back to SNF with on-going deconditioning.   Pt presents today for cardiac clearance for gallbladder surgery. Overall doing much better but still quite frail. Currently has PT coming twice/week, but is supposed to end this week. Using walker at all times. She is SOB with ADLs and walking short distances. Her kidney doctor reduced her lasix to 20 mg daily 2 weeks ago. She has not noticed a change in breathing since then. No CP. Sleeps in a recliner. Has some abdominal swelling that she thinks is food related. No LE edema. No fever, cough, or chills. No wheezing. Weights 104-106 lbs at home. Likes to eat at [employer]. Limits fluid intake.    Review of systems complete and found to be negative unless listed in HPI.    Past Medical History:  Diagnosis Date  . Anemia   . Aortic stenosis   . Arthritis    back (04/14/2017)  . Asthma   . CAD (coronary artery disease)   . Chronic bronchitis ([place])   . Chronic diastolic congestive heart failure ([place])   . CKD (chronic kidney disease), stage IV ([place])   . COPD (chronic  obstructive pulmonary disease) ([place])   . Gallbladder disease   . GERD (gastroesophageal reflux disease)   . History of blood transfusion "several times"   "low HgB"  . History of hiatal hernia   . Hyperlipidemia   . Hypertension   . Mitral stenosis   . 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 Type II diabetes mellitus ([place])   . Vitamin D deficiency     Current Outpatient Medications  Medication Sig Dispense Refill  . acetaminophen (TYLENOL) 500 MG tablet Take 500 mg by mouth every 6 (six) hours as needed for mild pain or moderate pain.    Marland Kitchen albuterol (PROVENTIL HFA;VENTOLIN HFA) 108 (90 Base) MCG/ACT inhaler Inhale 2 puffs every 6 (six) hours as needed into the lungs for wheezing or shortness of breath.     Marland Kitchen amLODipine (NORVASC) 5 MG tablet Take 1 tablet (5 mg total) by mouth daily. 30 tablet 6  . aspirin EC 81 MG EC tablet Take 1 tablet (81 mg total) by mouth daily.    Marland Kitchen atorvastatin (LIPITOR) 10 MG tablet Take 10 mg by mouth at bedtime.    . budesonide-formoterol (SYMBICORT) 160-4.5 MCG/ACT inhaler Inhale 2 puffs into the lungs daily as needed (for shortness of breath).     . cetirizine (ZYRTEC) 10 MG tablet Take 10 mg at bedtime by mouth.     . chlorhexidine (PERIDEX) 0.12 % solution Use as directed 15  mLs in the mouth or throat 2 (two) times daily. 480 mL prn  . docusate sodium (COLACE) 100 MG capsule Take 100 mg by mouth every 12 (twelve) hours as needed for mild constipation.     . feeding supplement, ENSURE ENLIVE, (ENSURE ENLIVE) LIQD Take 237 mLs by mouth 2 (two) times daily between meals. 237 mL 12  . furosemide (LASIX) 20 MG tablet Take 20 mg by mouth daily.    . insulin aspart (NOVOLOG) 100 UNIT/ML injection Inject 0-24 Units into the skin every 4 (four) hours. (Patient taking differently: Inject 6 Units into the skin 2 (two) times daily. ) 10 mL 11  .  insulin glargine (LANTUS) 100 UNIT/ML injection Inject 0.08 mLs (8 Units total) into the skin daily. (Patient taking differently: Inject 30 Units into the skin at bedtime. ) 10 mL 11  . ipratropium (ATROVENT) 0.02 % nebulizer solution Take 0.5 mg by nebulization 2 (two) times daily.    Marland Kitchen lubiprostone (AMITIZA) 8 MCG capsule Take 8 mcg by mouth every 12 (twelve) hours as needed for constipation.     . montelukast (SINGULAIR) 10 MG tablet Take 10 mg by mouth at bedtime.    . Multiple Vitamins-Minerals (MULTIVITAMIN WITH MINERALS) tablet Take 1 tablet by mouth daily.    . pantoprazole (PROTONIX) 40 MG tablet Take 1 tablet (40 mg total) by mouth 2 (two) times daily before a meal. 60 tablet 2  . Polyvinyl Alcohol-Povidone (CLEAR EYES NATURAL TEARS) 5-6 MG/ML SOLN Place 2 drops into both eyes 2 (two) times daily.    Marland Kitchen Propylene Glycol-Glycerin (ARTIFICIAL TEARS) 1-0.3 % SOLN Place 2 drops into both eyes daily as needed (for dry eyes).    . sodium chloride (OCEAN) 0.65 % SOLN nasal spray Place 2 sprays into both nostrils every 12 (twelve) hours as needed for congestion.     . sucralfate (CARAFATE) 1 GM/10ML suspension Take 10 mLs (1 g total) by mouth 3 (three) times daily with meals. 420 mL 0  . traMADol (ULTRAM) 50 MG tablet Take 1 tablet (50 mg total) by mouth every 12 (twelve) hours as needed for moderate pain. 14 tablet 0   No current facility-administered medications for this encounter.     No Known Allergies    Social History   Socioeconomic History  . Marital status: Widowed    Spouse name: Not on file  . Number of children: 0  . Years of education: Not on file  . Highest education level: Not on file  Occupational History  . 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 Financial resource strain: Not on file  . Food insecurity:    Worry: Not on file    Inability:  Not on file  . Transportation needs:    Medical: Not on file    Non-medical: Not on file  Tobacco Use  . Smoking status: Passive Smoke Exposure - Never Smoker  . Smokeless tobacco: Never Used  Substance and Sexual Activity  . Alcohol use: No    Frequency: Never  . Drug use: No  . Sexual activity: Never  Lifestyle  . Physical activity:    Days per week: Not on file    Minutes per session: Not on file  . Stress: Not on file  Relationships  . Social connections:    Talks on phone: Not on file    Gets together:  Not on file    Attends religious service: Not on file    Active member of club or organization: Not on file    Attends meetings of clubs or organizations: Not on file    Relationship status: Not on file  . Intimate partner violence:    Fear of current or ex partner: Not on file    Emotionally abused: Not on file    Physically abused: Not on file    Forced sexual activity: Not on file  Other Topics Concern  . Not on file  Social History Narrative   Here with Uncle and [place].      Family History  Problem Relation Age of Onset  . Hypertension Mother   . Kidney failure Mother   . Heart disease Mother   . Hypertension Father   . Heart disease Father   . Diabetes Father   . Cancer Sister     Vitals:   08/04/17 1052  BP: 124/68  Pulse: 78  SpO2: 97%  Weight: 109 lb 1.9 oz (49.5 kg)   Wt Readings from Last 3 Encounters:  08/04/17 109 lb 1.9 oz (49.5 kg)  06/03/17 108 lb 3.2 oz (49.1 kg)  05/06/17 109 lb 6.4 oz (49.6 kg)   PHYSICAL EXAM: General: Elderly appearing. No resp difficulty. Walked in with walker HEENT: Normal anicteric  Neck: Supple. JVP 5-6. Carotids 2+ bilat; no bruits. No thyromegaly or nodule noted. Cor: PMI nondisplaced. RRR, 2/6 murmur RUSB s2 crisp Lungs: CTAB, normal effort. Decreased BS throughout. No wheeze Abdomen: Soft, non-tender, non-distended, no HSM. No bruits or masses. +BS  Extremities: No cyanosis, clubbing, or rash. R and  LLE no edema. Warm  Neuro: alert & oriented x 3, cranial nerves grossly intact. moves all 4 extremities w/o difficulty. Affect pleasant   ASSESSMENT & PLAN:  1. A/C Diastolic Heart Failure  - Echo 04/28/17 LVEF 65-70%, grade 1 DD, Stable TAVR, Severe MS, Severe LAE - NYHA III-IIIb symptoms chronically.  - Volume status stable - Continue lasix 20 mg daily. - Reinforced fluid restriction to < 2 L daily, sodium restriction to less than 2000 mg daily, and the importance of daily weights.    2. COPD - Continue home meds. Finished steroid taper.  - Not on chronic O2.No change.   3. R groin pain-  - Resolved. No change.  - Ultrasound 04/15/18 negative. Dr Roxy Manns evaluated.   4. S/P TAVR 12/5:  - Stable on echo 04/28/17 No change - Continue ASA as tolerated.   5. H/O GI Bleed - Recent EGD/colonoscopy in [place] was unremarkable - She reports black stools for years (on iron) - Upper EGD 12/18 showed non specific gastritis, and a focal oozing gastritis treated with APC cautery. Missed her follow up with GI in January. Encouraged her to follow up with GI  6. DMII - Per PCP.   7. CKD IV - Creatinine 2.83 05/2017  8. Hyperkalemia - K 3.8 05/2017   9. Severe deconditioning - Now back at home with [facility] PT.   10. HTN - Continue Amlodipine 5 mg daily.   11. Surgical clearance - Medium to high risk for surgery. See discussion below.    Sonya Shore, NP 08/04/17   Patient seen and examined with the above-signed Advanced Practice Provider and/or Housestaff. I personally reviewed laboratory data, imaging studies and relevant notes. I independently examined the patient and formulated the important aspects of the plan. I have edited the note to reflect any of my changes  or salient points. I have personally discussed the plan with the patient and/or family.  She has improved from cardiac standpoint post-TAVR that said she has residual moderate to severe mitral stenosis and multiple  comorbidities including severe COPD and limited functional capacity. At this point, I think she would be at moderate to high-risk for severe, if not life-threatening, complications from GB surgery. That said, I do not think surgery is prohibitive. If she is having severe symptoms related to her GB would support surgery as long as she is aware of, and willing to take the risk. If symptoms cannot be directly attributed to her GB would avoid surgery.   Can f/u with Dr. Alroy Dust +/- [facility]. Happy to see her back in [facility] Clinic as needed  Glori Bickers, MD  11:41 AM

## 2017-11-09 ENCOUNTER — Telehealth: Payer: Self-pay | Admitting: Physician Assistant

## 2017-11-09 ENCOUNTER — Other Ambulatory Visit: Payer: Self-pay

## 2017-11-09 ENCOUNTER — Telehealth: Payer: Self-pay | Admitting: Cardiovascular Disease

## 2017-11-09 MED ORDER — FUROSEMIDE 20 MG PO TABS: 20.0000 mg | ORAL_TABLET | Freq: Every day | ORAL | 3 refills | Status: DC

## 2017-11-09 NOTE — Telephone Encounter (Signed)
Spoke with patient. Patient stated she took 40mg  of lasix yesterday and 40mg  today she feels better weight went from 105.2lbs to 103.8lbs and swelling in her ankles is down. Patient will resume prescribed dose of lasix 20mg  daily tomorrow.

## 2017-11-09 NOTE — Telephone Encounter (Signed)
New Message     *STAT* If patient is at the pharmacy, call can be transferred to refill team.   1. Which medications need to be refilled? (please list name of each medication and dose if known) furosemide (LASIX) 20 MG tablet  2. Which pharmacy/location (including street and city if local pharmacy) is medication to be sent to? [place] [address]  3. Do they need a 30 day or 90 day supply? 90 day

## 2017-11-09 NOTE — Telephone Encounter (Signed)
New Message   Pt states she has been coughing a lot, she thinks she has fluid on her lungs and would like to speak with a nurse. Please call

## 2017-11-29 ENCOUNTER — Encounter: Payer: Self-pay | Admitting: Physician Assistant

## 2017-11-29 ENCOUNTER — Other Ambulatory Visit: Payer: Self-pay

## 2017-11-29 DIAGNOSIS — I350 Nonrheumatic aortic (valve) stenosis: Secondary | ICD-10-CM

## 2017-11-29 DIAGNOSIS — Z952 Presence of prosthetic heart valve: Secondary | ICD-10-CM

## 2018-03-28 NOTE — Progress Notes (Signed)
[name]                                       Cardiology Office Note    Date:  03/29/2018   ID:  Sonya Carpenter, DOB 17-Oct-1945, MRN [id number]  PCP:  Michell Heinrich, DO  Cardiologist: Dr. Haroldine Laws /  Dr. Burt Knack &Dr. Roxy Manns (TAVR) - Dr. Colin Benton told her she no longer needed to follow with him. She does not want to follow in [place]. She asked to follow with Dr. Burt Knack.   CC: 1 year s/p TAVR   History of Present Illness:  Sonya Carpenter is a 72 y.o. 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  Patient underwent successful TAVR on 12/4/18with a33mm Edwards Sapien 3 THV via femoral cut down. POD #1 echo showed normally functioning TAVR valve with mean gradient 17 mm Hg and severe MS, mild-mod TR. Post op course was complicated by volume overload requriing IV Lasix.Her hemoglobin continued to drift down and she was found to have heme positive stool. She underwent an upper GI endoscopy which showed non-specific distal gastritisandoozing AVM vs. Cameron's erosions,treated with APC cautery.She was started onPPI and Carafate and plavix and ASAwere heldper GI's recommendations.She was discharged to [facility].  She was admitted 12/20-12/26/18 for diuresis, treatment of AECOPD and evaluation of groin pain. Groin [place] was negative for pseudoaneurysm. She improved with IV lasix and nebs, steroids and doxycycline. Her Hg remained stable and she was restarted on baby ASA.  She has been followed by Dr. Haroldine Laws since and has been doing well. Was last seen by Dr. Haroldine Laws in 07/2017 for clearance for gall bladder removal. She was felt to be at mod-high risk for  complications but cleared if she was having significant sx related to her GB. She never had this done.   Today she presents to clinic for follow up. She has had a lot of difficulties with dry eyes and asthma. No CP or SOB. No LE edema. She does have chronic orthopnea but no PND. No dizziness or syncope. No blood in stool or urine. No palpitations. She  Is not very active but does get out to go to the grocery store to and to the doctor. She walks with a walker. No issues when walking short distances, but does get tired out and short of breath with moderate exertion. A lot of her shortness of breath is related to asthma.    Past Medical History:  Diagnosis Date  . Anemia   . Aortic stenosis   . Arthritis    back (04/14/2017)  . Asthma   . CAD (coronary artery disease)   . Chronic bronchitis ([place])   . Chronic diastolic congestive heart failure ([place])   . CKD (chronic kidney disease), stage IV ([place])   . COPD (chronic obstructive pulmonary disease) ([place])   . Gallbladder disease   . GERD (gastroesophageal reflux disease)   . History of blood transfusion "several times"   "low HgB"  . History of hiatal hernia   . Hyperlipidemia   . Hypertension   . Mitral stenosis   . S/P TAVR (transcatheter aortic valve replacement) 03/29/2017  23 mm Edwards Sapien 3 transcatheter heart valve placed via open right transfemoral approach   . Type II diabetes mellitus ([place])   . Vitamin D deficiency     Past Surgical History:  Procedure Laterality Date  . BREAST CYST EXCISION Right   . CARDIAC CATHETERIZATION    . CATARACT EXTRACTION W/ INTRAOCULAR LENS  IMPLANT, BILATERAL Bilateral   . COLONSCOPY    . ESOPHAGOGASTRODUODENOSCOPY N/A 04/01/2017   Procedure: ESOPHAGOGASTRODUODENOSCOPY (EGD);  Surgeon: Milus Banister, MD;  Location: [facility] ENDOSCOPY;  Service: Endoscopy;  Laterality: N/A;  . FRACTURE SURGERY    . MULTIPLE EXTRACTIONS WITH ALVEOLOPLASTY N/A 03/14/2017   Procedure: Extraction of tooth #'s 2-11,  15, 19-28 and 31 with alveoloplasty, bilateral mandibular tori reductions and bilateral mandibular lingual exostoses reductions.;  Surgeon: Lenn Cal, DDS;  Location: [place];  Service: Oral Surgery;  Laterality: N/A;  . PATELLA FRACTURE SURGERY Right   . RETINAL LASER PROCEDURE Bilateral   . RIGHT/LEFT HEART CATH AND CORONARY ANGIOGRAPHY N/A 02/09/2017   Procedure: RIGHT/LEFT HEART CATH AND CORONARY ANGIOGRAPHY;  Surgeon: Burnell Blanks, MD;  Location: [facility] CV LAB;  Service: Cardiovascular;  Laterality: N/A;  . TEE WITHOUT CARDIOVERSION N/A 03/29/2017   Procedure: TRANSESOPHAGEAL ECHOCARDIOGRAM (TEE);  Surgeon: [name], MD;  Location: [place];  Service: Open Heart Surgery;  Laterality: N/A;  . TONSILLECTOMY    . TRANSCATHETER AORTIC VALVE REPLACEMENT, TRANSFEMORAL N/A 03/29/2017   Procedure: TRANSCATHETER AORTIC VALVE REPLACEMENT, TRANSFEMORAL;  Surgeon: [name], MD;  Location: [facility];  Service: Open Heart Surgery;  Laterality: N/A;    Current Medications: Outpatient Medications Prior to Visit  Medication Sig Dispense Refill  . acetaminophen (TYLENOL) 500 MG tablet Take 500 mg by mouth every 6 (six) hours as needed for mild pain or moderate pain.    Marland Kitchen albuterol (PROVENTIL HFA;VENTOLIN HFA) 108 (90 Base) MCG/ACT inhaler Inhale 2 puffs every 6 (six) hours as needed into the lungs for wheezing or shortness of breath.     Marland Kitchen aspirin EC 81 MG EC tablet Take 1 tablet (81 mg total) by mouth daily.    Marland Kitchen atorvastatin (LIPITOR) 20 MG tablet Take 20 mg by mouth at bedtime.     . budesonide-formoterol (SYMBICORT) 160-4.5 MCG/ACT inhaler Inhale 2 puffs into the lungs daily as needed (for shortness of breath).     . cetirizine (ZYRTEC) 10 MG tablet Take 10 mg at bedtime by mouth.     . chlorhexidine (PERIDEX) 0.12 % solution Use as directed 15 mLs in the mouth or throat 2 (two) times daily. 480 mL prn  . docusate sodium (COLACE) 100 MG capsule Take 100 mg by mouth every 12  (twelve) hours as needed for mild constipation.     . feeding supplement, ENSURE ENLIVE, (ENSURE ENLIVE) LIQD Take 237 mLs by mouth 2 (two) times daily between meals. 237 mL 12  . ferrous sulfate 325 (65 FE) MG tablet Take 1 tablet by mouth 2 (two) times daily.    . furosemide (LASIX) 20 MG tablet Take 1 tablet (20 mg total) by mouth daily. 90 tablet 3  . insulin aspart (NOVOLOG) 100 UNIT/ML injection Inject 0-24 Units into the skin every 4 (four) hours. (Patient taking differently: Inject 6 Units into the skin 2 (two) times daily. ) 10 mL 11  . Insulin Glargine (BASAGLAR KWIKPEN [place]) Inject 20 Units into the skin daily.    Marland Kitchen ipratropium (ATROVENT) 0.02 % nebulizer solution Take 0.5 mg by nebulization 2 (two) times daily.    Marland Kitchen  lubiprostone (AMITIZA) 8 MCG capsule Take 8 mcg by mouth every 12 (twelve) hours as needed for constipation.     . montelukast (SINGULAIR) 10 MG tablet Take 10 mg by mouth at bedtime.    . Multiple Vitamins-Minerals (MULTIVITAMIN WITH MINERALS) tablet Take 1 tablet by mouth daily.    . pantoprazole (PROTONIX) 40 MG tablet Take 1 tablet (40 mg total) by mouth 2 (two) times daily before a meal. 60 tablet 2  . Polyvinyl Alcohol-Povidone (CLEAR EYES NATURAL TEARS) 5-6 MG/ML SOLN Place 2 drops into both eyes 2 (two) times daily.    Marland Kitchen Propylene Glycol-Glycerin (ARTIFICIAL TEARS) 1-0.3 % SOLN Place 2 drops into both eyes daily as needed (for dry eyes).    . sodium chloride (OCEAN) 0.65 % SOLN nasal spray Place 2 sprays into both nostrils every 12 (twelve) hours as needed for congestion.     . sucralfate (CARAFATE) 1 GM/10ML suspension Take 10 mLs (1 g total) by mouth 3 (three) times daily with meals. 420 mL 0  . traMADol (ULTRAM) 50 MG tablet Take 1 tablet (50 mg total) by mouth every 12 (twelve) hours as needed for moderate pain. 14 tablet 0  . amLODipine (NORVASC) 5 MG tablet Take 1 tablet (5 mg total) by mouth daily. (Patient not taking: Reported on 03/29/2018) 30 tablet 6  .  insulin glargine (LANTUS) 100 UNIT/ML injection Inject 0.08 mLs (8 Units total) into the skin daily. (Patient not taking: Reported on 03/29/2018) 10 mL 11   No facility-administered medications prior to visit.      Allergies:   Patient has no known allergies.   Social History   Socioeconomic History  . Marital status: Widowed    Spouse name: Not on file  . Number of children: 0  . Years of education: Not on file  . Highest education level: Not on file  Occupational History  . 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 Financial resource strain: Not on file  . Food insecurity:    Worry: Not on file    Inability: Not on file  . Transportation needs:    Medical: Not on file    Non-medical: Not on file  Tobacco Use  . Smoking status: Passive Smoke Exposure - Never Smoker  . Smokeless tobacco: Never Used  Substance and Sexual Activity  . Alcohol use: No    Frequency: Never  . Drug use: No  . Sexual activity: Never  Lifestyle  . Physical activity:    Days per week: Not on file    Minutes per session: Not on file  . Stress: Not on file  Relationships  . Social connections:    Talks on phone: Not on file    Gets together: Not on file    Attends religious service: Not on file    Active member of club or organization: Not on file    Attends meetings of clubs or organizations: Not on file    Relationship status: Not on file  Other Topics Concern  . Not on file  Social History Narrative   Here with Uncle and [place].     Family History:  The patient's family history includes Cancer in her sister; Diabetes in her father; Heart disease in her father and mother; Hypertension in her father and mother; Kidney failure in her mother.      ROS:  Please see the history of present illness.    ROS All other systems reviewed and are negative.   PHYSICAL  EXAM:   VS:  BP (!) 118/58   Pulse 88   Ht 4\' 10"  (1.473 m)   Wt 101 lb 12.8 oz (46.2 kg)   SpO2 93%   BMI 21.28 kg/m    GEN: Well nourished, well developed, in no acute distress. Chronically ill appearing.  HEENT: normal Neck: no JVD or masses Cardiac: RRR; no murmurs, rubs, or gallops,no edema  Respiratory:  clear to auscultation bilaterally, normal work of breathing GI: soft, nontender, nondistended, + BS MS: no deformity or atrophy Skin: warm and dry, no rash Neuro:  Alert and Oriented x 3, Strength and sensation are intact Psych: euthymic mood, full affect   Wt Readings from Last 3 Encounters:  03/29/18 101 lb 12.8 oz (46.2 kg)  08/04/17 109 lb 1.9 oz (49.5 kg)  06/03/17 108 lb 3.2 oz (49.1 kg)      Studies/Labs Reviewed:   EKG:  EKG is NOT ordered today.  Recent Labs: 03/30/2017: Magnesium 2.0 04/07/2017: NT-Pro BNP 2,714 04/28/2017: Hemoglobin 9.9; Platelets 81 05/06/2017: B Natriuretic Peptide 181.7 06/03/2017: BUN 98; Creatinine, Ser 2.83; Potassium 3.8; Sodium 141   Lipid Panel No results found for: CHOL, TRIG, HDL, CHOLHDL, VLDL, LDLCALC, LDLDIRECT  Additional studies/ records that were reviewed today include:  TAVR OPERATIVE NOTE  Date of Procedure:03/30/2017  Preoperative Diagnosis:Severe Aortic Stenosis   Postoperative Diagnosis:Same   Procedure:   Transcatheter Aortic Valve Replacement -OpenTransfemoral Approach Edwards Sapien 3 THV (size 43mm, model # 9600TFX, serial # [id number])  Co-Surgeons:Clarence H. Roxy Manns, MD and [name], MD  Anesthesiologist:Charlene Nyoka Cowden, MD  Echocardiographer:Dalton Aundra Dubin, MD  Pre-operative Echo Findings: ? Severe aortic stenosis ? Normalleft ventricular systolic function  Post-operative Echo Findings: ? traceparavalvular leak ? normalleft ventricular systolic  function   ________________  Echo 04/29/17 (1 month s/p TAVR) Study Conclusions: - Left ventricle: The cavity size was normal. There was moderate concentric hypertrophy. Systolic function was vigorous. The estimated ejection fraction was in the range of 65% to 70%. Wall motion was normal; there were no regional wall motion abnormalities. Doppler parameters are consistent with abnormal left ventricular relaxation (grade 1 diastolic dysfunction). Doppler parameters are consistent with severely elevated ventricular end-diastolic filling pressure. - Aortic valve: S/P TAVR with a Edwards Sapien 3 Transcatheter Heart Valve (size 23 mm). Mean gradient (S): 19 mm Hg. Peak gradient (S): 32 mm Hg. - Mitral valve: Severely calcified annulus. Severely thickened, severely calcified leaflets . Mobility was restricted. The findings are consistent with severe stenosis. There was mild regurgitation. Mean gradient (D): 13 mm Hg. Peak gradient (D): 24 mm Hg. - Left atrium: The atrium was severely dilated. - Right ventricle: Systolic function was normal. - Tricuspid valve: There was mild regurgitation. - Pulmonary arteries: Systolic pressure was within the normal range. Impressions: - When compared to the prior study from 03/29/2017 there is a new trans catheter 23 mm Edwards SAPIEN 3 valve in the aortic position. Transaortic gradients are at the upper normal limit (mean gradient 19 mmHg, peak velocity 2.8 m/s). There is trivial paravalvular leak.There is severe mitral stenosis with mean trans mitral gradient 13 mmHg.  _________________  Echo 03/29/18 Study Conclusions  - Left ventricle: The cavity size was normal. There was moderate   concentric hypertrophy. Systolic function was normal. The   estimated ejection fraction was in the range of 60% to 65%. Wall   motion was normal; there  were no regional wall motion   abnormalities. Doppler parameters are  consistent with abnormal   left ventricular relaxation (grade 1 diastolic dysfunction).   Doppler parameters are consistent with elevated ventricular   end-diastolic filling pressure. - Aortic valve: S/P TAVR with a 23 mm Edwards SAPIEN 3 valve. There   was trivial paravalvular leak. Mean gradient (S): 12 mm Hg. Peak   gradient (S): 23 mm Hg. - Mitral valve: Mobility was restricted. The findings are   consistent with severe stenosis. Mean gradient (D): 13 mm Hg. - Left atrium: The atrium was moderately dilated. - Tricuspid valve: There was mild regurgitation. - Pulmonic valve: There was mild regurgitation. - Pulmonary arteries: Systolic pressure was within the normal   range. Impressions: - When compared to the prior study from 04/28/2017 there has been no   significant change, transaortic velocities remain normal   (peak/mean 22/12 mmHg) and there is trivial paravalvular leak.   ASSESSMENT & PLAN:   Severe AS s/p TAVR: echo today shows EF 60%, normally functioning TAVR; mean gradient 27mm Hg and trivial PVL. She has NYHA class II symptoms, but mostly related to lung disease. SBE prophylaxis discussed; she is edentulous. Continue on Aspirin 81mg  daily.   Chronic diastolic CHF: appears euvolemic. Continue current regimen   Severe mitral stenosis: continue to manage medically   COPD: stable   CAD: pre TAVR cath showed single vessel CAD with moderately severe, calcified stenosis in the pLCx. Continue ASA and statin.   Medication Adjustments/Labs and Tests Ordered: Current medicines are reviewed at length with the patient today.  Concerns regarding medicines are outlined above.  Medication changes, Labs and Tests ordered today are listed in the Patient Instructions below. Patient Instructions  Medication Instructions:   Your physician recommends that you continue on your current medications as directed. Please refer to the Current Medication list given to you today.  If you need a  refill on your cardiac medications before your next appointment, please call your pharmacy.      Follow-Up:  Your physician wants you to follow-up in: 6 MONTHS WITH DR. Emelda Fear will receive a reminder letter in the mail two months in advance. If you don't receive a letter, please call our office to schedule the follow-up appointment.       Signed, Angelena Form, PA-C  03/29/2018 5:14 PM    [facility] [address], [place], [place]  [address] Phone: [phone]; Fax: [phone]

## 2018-03-29 ENCOUNTER — Ambulatory Visit: Payer: Medicare HMO | Admitting: Physician Assistant

## 2018-03-29 ENCOUNTER — Other Ambulatory Visit: Payer: Self-pay

## 2018-03-29 ENCOUNTER — Encounter: Payer: Self-pay | Admitting: Physician Assistant

## 2018-03-29 ENCOUNTER — Ambulatory Visit (HOSPITAL_COMMUNITY): Payer: Medicare HMO | Attending: Cardiology

## 2018-03-29 VITALS — BP 118/58 | HR 88 | Ht <= 58 in | Wt 101.8 lb

## 2018-03-29 DIAGNOSIS — I350 Nonrheumatic aortic (valve) stenosis: Secondary | ICD-10-CM | POA: Diagnosis present

## 2018-03-29 DIAGNOSIS — I5032 Chronic diastolic (congestive) heart failure: Secondary | ICD-10-CM

## 2018-03-29 DIAGNOSIS — I050 Rheumatic mitral stenosis: Secondary | ICD-10-CM | POA: Diagnosis not present

## 2018-03-29 DIAGNOSIS — Z952 Presence of prosthetic heart valve: Secondary | ICD-10-CM

## 2018-03-29 DIAGNOSIS — J449 Chronic obstructive pulmonary disease, unspecified: Secondary | ICD-10-CM

## 2018-03-29 DIAGNOSIS — I2510 Atherosclerotic heart disease of native coronary artery without angina pectoris: Secondary | ICD-10-CM

## 2018-03-29 NOTE — Patient Instructions (Signed)
Medication Instructions:   Your physician recommends that you continue on your current medications as directed. Please refer to the Current Medication list given to you today.  If you need a refill on your cardiac medications before your next appointment, please call your pharmacy.      Follow-Up:  Your physician wants you to follow-up in: 6 MONTHS WITH DR. Emelda Fear will receive a reminder letter in the mail two months in advance. If you don't receive a letter, please call our office to schedule the follow-up appointment.

## 2018-05-12 ENCOUNTER — Encounter: Payer: Self-pay | Admitting: Thoracic Surgery (Cardiothoracic Vascular Surgery)

## 2018-05-31 ENCOUNTER — Telehealth: Payer: Self-pay | Admitting: Cardiovascular Disease

## 2018-05-31 NOTE — Telephone Encounter (Signed)
Spoke with the patient for a long while about scheduling her appointment with Dr. Burt Knack in June. She reports she needs a Tuesday or Thursday appointment. She understands Dr. Burt Knack is never in the office on Tuesdays and is not in the office on any Thursdays this June. She understands she will be called when Dr. Antionette Char July schedule comes out to arrange an appointment then. She was grateful for assistance.

## 2018-05-31 NOTE — Telephone Encounter (Signed)
New Message   Patient is calling because nurse was to call her back and schedule her an appointment. Please call.

## 2018-06-01 ENCOUNTER — Telehealth: Payer: Self-pay

## 2018-06-01 NOTE — Telephone Encounter (Signed)
Referral notes received from [place]: [phone], F: [phone]  Notes sent to scheduling.

## 2018-09-08 ENCOUNTER — Telehealth: Payer: Self-pay | Admitting: Physician Assistant

## 2018-09-08 NOTE — Telephone Encounter (Signed)
NEW MESSAGE : 5/15 -  Spoke to patient.     [email or web address] visit on 09/12/2018     Phone Call to obtain consent   -  09/08/2018         Virtual Visit Pre-Appointment Phone Call  "(Name), I am calling you today to discuss your upcoming appointment. We are currently trying to limit exposure to the virus that causes COVID-19 by seeing patients at home rather than in the office."  1. "What is the BEST phone number to call the day of the visit?" - include this in appointment notes  2. Do you have or have access to (through a family member/friend) a smartphone with video capability that we can use for your visit?" a. If yes - list this number in appt notes as cell (if different from BEST phone #) and list the appointment type as a VIDEO visit in appointment notes b. If no - list the appointment type as a PHONE visit in appointment notes  3. Confirm consent - "In the setting of the current Covid19 crisis, you are scheduled for a (phone or video) visit with your provider on (date) at (time).  Just as we do with many in-office visits, in order for you to participate in this visit, we must obtain consent.  If you'd like, I can send this to your mychart (if signed up) or email for you to review.  Otherwise, I can obtain your verbal consent now.  All virtual visits are billed to your insurance company just like a normal visit would be.  By agreeing to a virtual visit, we'd like you to understand that the technology does not allow for your provider to perform an examination, and thus may limit your provider's ability to fully assess your condition. If your provider identifies any concerns that need to be evaluated in person, we will make arrangements to do so.  Finally, though the technology is pretty good, we cannot assure that it will always work on either your or our end, and in the setting of a video visit, we may have to convert it to a phone-only visit.  In either situation, we cannot  ensure that we have a secure connection.  Are you willing to proceed?" STAFF: Did the patient verbally acknowledge consent to telehealth visit? Document YES/NO here: YES  4. Advise patient to be prepared - "Two hours prior to your appointment, go ahead and check your blood pressure, pulse, oxygen saturation, and your weight (if you have the equipment to check those) and write them all down. When your visit starts, your provider will ask you for this information. If you have an Apple Watch or Kardia device, please plan to have heart rate information ready on the day of your appointment. Please have a pen and paper handy nearby the day of the visit as well."  5. Give patient instructions for MyChart download to smartphone OR Doximity/Doxy.me as below if video visit (depending on what platform provider is using)  6. Inform patient they will receive a phone call 15 minutes prior to their appointment time (may be from unknown caller ID) so they should be prepared to answer    TELEPHONE CALL NOTE  Sonya Carpenter has been deemed a candidate for a follow-up tele-health visit to limit community exposure during the Covid-19 pandemic. I spoke with the patient via phone to ensure availability of phone/video source, confirm preferred email & phone number, and discuss instructions and expectations.  I reminded Sonya Carpenter to  be prepared with any vital sign and/or heart rhythm information that could potentially be obtained via home monitoring, at the time of her visit. I reminded Sonya Carpenter to expect a phone call prior to her visit.  [facility] Headings 09/08/2018 2:07 PM   INSTRUCTIONS FOR DOWNLOADING THE MYCHART APP TO SMARTPHONE  - The patient must first make sure to have activated MyChart and know their login information - If Apple, go to [employer] and type in MyChart in the search bar and download the app. If Android, ask patient to go to [employer] and type in [place] in the search bar and download  the app. The app is free but as with any other app downloads, their phone may require them to verify saved payment information or Apple/Android password.  - The patient will need to then log into the app with their MyChart username and password, and select [facility] as their healthcare provider to link the account. When it is time for your visit, go to the MyChart app, find appointments, and click Begin Video Visit. Be sure to Select Allow for your device to access the Microphone and Camera for your visit. You will then be connected, and your provider will be with you shortly.  **If they have any issues connecting, or need assistance please contact MyChart service desk [phone])**  **If using a computer, in order to ensure the best quality for their visit they will need to use either of the following Internet Browsers: [employer], or Google Chrome**  IF USING DOXIMITY or DOXY.ME - The patient will receive a link just prior to their visit by text.     FULL LENGTH CONSENT FOR TELE-HEALTH VISIT   I hereby voluntarily request, consent and authorize [facility] and its employed or contracted physicians, physician assistants, nurse practitioners or other licensed health care professionals (the Practitioner), to provide me with telemedicine health care services (the Services") as deemed necessary by the treating Practitioner. I acknowledge and consent to receive the Services by the Practitioner via telemedicine. I understand that the telemedicine visit will involve communicating with the Practitioner through live audiovisual communication technology and the disclosure of certain medical information by electronic transmission. I acknowledge that I have been given the opportunity to request an in-person assessment or other available alternative prior to the telemedicine visit and am voluntarily participating in the telemedicine visit.  I understand that I have the right to withhold  or withdraw my consent to the use of telemedicine in the course of my care at any time, without affecting my right to future care or treatment, and that the Practitioner or I may terminate the telemedicine visit at any time. I understand that I have the right to inspect all information obtained and/or recorded in the course of the telemedicine visit and may receive copies of available information for a reasonable fee.  I understand that some of the potential risks of receiving the Services via telemedicine include:   Delay or interruption in medical evaluation due to technological equipment failure or disruption;  Information transmitted may not be sufficient (e.g. poor resolution of images) to allow for appropriate medical decision making by the Practitioner; and/or   In rare instances, security protocols could fail, causing a breach of personal health information.  Furthermore, I acknowledge that it is my responsibility to provide information about my medical history, conditions and care that is complete and accurate to the best of my ability. I acknowledge that Practitioner's advice, recommendations,  and/or decision may be based on factors not within their control, such as incomplete or inaccurate data provided by me or distortions of diagnostic images or specimens that may result from electronic transmissions. I understand that the practice of medicine is not an exact science and that Practitioner makes no warranties or guarantees regarding treatment outcomes. I acknowledge that I will receive a copy of this consent concurrently upon execution via email to the email address I last provided but may also request a printed copy by calling the office of [facility].    I understand that my insurance will be billed for this visit.   I have read or had this consent read to me.  I understand the contents of this consent, which adequately explains the benefits and risks of the Services being provided  via telemedicine.   I have been provided ample opportunity to ask questions regarding this consent and the Services and have had my questions answered to my satisfaction.  I give my informed consent for the services to be provided through the use of telemedicine in my medical care  By participating in this telemedicine visit I agree to the above.

## 2018-09-08 NOTE — Telephone Encounter (Signed)
° °  5/15 :  Left VM for patient to return call. Call is in regards to upcoming virtual visit & also need consent from patient for visit. When patient calls back please transfer call to me. Cell # [phone]

## 2018-09-11 NOTE — Progress Notes (Signed)
Virtual Visit via Video Note   This visit type was conducted due to national recommendations for restrictions regarding the COVID-19 Pandemic (e.g. social distancing) in an effort to limit this patient's exposure and mitigate transmission in our community.  Due to her co-morbid illnesses, this patient is at least at moderate risk for complications without adequate follow up.  This format is felt to be most appropriate for this patient at this time.  All issues noted in this document were discussed and addressed.  A limited physical exam was performed with this format.  Please refer to the patient's chart for her consent to telehealth for [facility].   Date:  09/12/2018   ID:  Sonya Carpenter, DOB 22-Jun-1945, MRN [id number]  Patient Location: Home Provider Location: Home  PCP:  Michell Heinrich, DO  Cardiologist:  [name], MD   Electrophysiologist:  None  Nephrologist: Dr. Marlowe Sax in [place], [place]  Evaluation Performed:  Follow-Up Visit  Chief Complaint: Follow-up on congestive heart failure and valvular heart disease  History of Present Illness:    Sonya Carpenter is a 73 y.o. 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  She underwent TAVR in 03/2017.  She was admitted in 03/2017 due to decompensated congestive heart failure.  She was followed by Dr. Haroldine Laws after this in the [facility] up until 07/2017.  She was released from the [facility] Clinic and is now followed by Dr. Burt Knack.  She was last seen by Nell Range, PA-C in 03/2018.    Today, she notes she is doing well.  Her breathing is overall stable.  She sleeps on an incline chronically.  She has not had significant lower extremity swelling.  She has not had chest pain.  She has not had syncope.  The patient does not have symptoms concerning for COVID-19 infection  (fever, chills, cough, or new shortness of breath).    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   23 mm Edwards Sapien 3 transcatheter heart valve placed via open right transfemoral approach    Type II diabetes mellitus ([place])    Vitamin D deficiency    Past Surgical History:  Procedure Laterality Date   BREAST CYST EXCISION Right    CARDIAC CATHETERIZATION     CATARACT EXTRACTION W/ INTRAOCULAR LENS  IMPLANT, BILATERAL Bilateral    COLONSCOPY     ESOPHAGOGASTRODUODENOSCOPY N/A 04/01/2017   Procedure: ESOPHAGOGASTRODUODENOSCOPY (EGD);  Surgeon: Milus Banister, MD;  Location[facility] ENDOSCOPY;  Service: Endoscopy;  Laterality: N/A;   FRACTURE SURGERY     MULTIPLE EXTRACTIONS WITH ALVEOLOPLASTY N/A 03/14/2017   Procedure: Extraction of tooth #'s 2-11, 15, 19-28 and 31 with alveoloplasty, bilateral mandibular tori reductions and bilateral mandibular lingual exostoses reductions.;  Surgeon: Lenn Cal, DDS;  Location: [facility];  Service: Oral Surgery;  Laterality: N/A;   PATELLA FRACTURE SURGERY Right    RETINAL LASER PROCEDURE Bilateral    RIGHT/LEFT HEART CATH AND CORONARY ANGIOGRAPHY N/A 02/09/2017   Procedure: RIGHT/LEFT HEART CATH AND CORONARY ANGIOGRAPHY;  Surgeon: Burnell Blanks, MD;  Location: [facility];  Service: Cardiovascular;  Laterality: N/A;   TEE WITHOUT CARDIOVERSION N/A  03/29/2017   Procedure: TRANSESOPHAGEAL ECHOCARDIOGRAM (TEE);  Surgeon: [name], MD;  Location: [facility];  Service: Open Heart Surgery;  Laterality: N/A;   TONSILLECTOMY     TRANSCATHETER AORTIC VALVE REPLACEMENT, TRANSFEMORAL N/A 03/29/2017   Procedure: TRANSCATHETER AORTIC VALVE REPLACEMENT, TRANSFEMORAL;  Surgeon: [name], MD;  Location: [facility];  Service: Open Heart Surgery;  Laterality: N/A;     Current Meds  Medication Sig   acetaminophen (TYLENOL) 500 MG tablet Take 500 mg by mouth every 6 (six) hours as needed for mild pain or moderate pain.   albuterol (PROVENTIL HFA;VENTOLIN HFA) 108 (90 Base) MCG/ACT inhaler Inhale 2 puffs every 6 (six) hours as needed into the lungs for wheezing or shortness of breath.    aspirin EC 81 MG EC tablet Take 1 tablet (81 mg total) by mouth daily.   atorvastatin (LIPITOR) 20 MG tablet Take 20 mg by mouth at bedtime.    azelastine (ASTELIN) 0.1 % nasal spray Place 2 sprays into both nostrils 2 (two) times daily. Use in each nostril as directed   BREO ELLIPTA 200-25 MCG/INH AEPB Inhale 1 puff into the lungs daily.   chlorhexidine (PERIDEX) 0.12 % solution Use as directed 15 mLs in the mouth or throat 2 (two) times daily.   docusate sodium (COLACE) 100 MG capsule Take 100 mg by mouth 2 (two) times daily.   ferrous sulfate 325 (65 FE) MG tablet Take 1 tablet by mouth 2 (two) times daily.   fluticasone (FLONASE) 50 MCG/ACT nasal spray Place 1 spray into both nostrils 2 (two) times a day.   furosemide (LASIX) 20 MG tablet Take 1 tablet (20 mg total) by mouth daily.   insulin aspart (NOVOLOG) 100 UNIT/ML injection Inject 0-24 Units into the skin every 4 (four) hours.   Insulin Glargine (BASAGLAR KWIKPEN [place]) Inject 20 Units into the skin daily.   ipratropium (ATROVENT) 0.02 % nebulizer solution Take 0.5 mg by nebulization 2 (two) times daily.   lubiprostone (AMITIZA) 8 MCG capsule Take 8 mcg by mouth every 12 (twelve) hours as needed  for constipation.    montelukast (SINGULAIR) 10 MG tablet Take 10 mg by mouth at bedtime.   Multiple Vitamins-Minerals (MULTIVITAMIN WITH MINERALS) tablet Take 1 tablet by mouth daily.   multivitamin (RENA-VIT) TABS tablet Take 1 tablet by mouth daily.   Nutritional Supplements (FEEDING SUPPLEMENT, NEPRO CARB STEADY,) LIQD Take 237 mLs by mouth 2 (two) times daily between meals.   pantoprazole (PROTONIX) 40 MG tablet Take 40 mg by mouth daily.   Polyvinyl Alcohol-Povidone (CLEAR EYES NATURAL TEARS) 5-6 MG/ML SOLN Place 2 drops into both eyes 2 (two) times daily.   Propylene Glycol-Glycerin (ARTIFICIAL TEARS) 1-0.3 % SOLN Place 2 drops into both eyes daily as needed (for dry eyes).   sodium chloride (OCEAN) 0.65 % SOLN nasal spray Place 2 sprays into both nostrils every 12 (twelve) hours as needed for congestion.    sucralfate (CARAFATE) 1 GM/10ML suspension Take 10 mLs (1 g total) by mouth 3 (three) times daily with meals.   traMADol (ULTRAM) 50 MG  tablet Take 1 tablet (50 mg total) by mouth every 12 (twelve) hours as needed for moderate pain.   [DISCONTINUED] chlorhexidine (PERIDEX) 0.12 % solution Use as directed 15 mLs in the mouth or throat 2 (two) times daily.     Allergies:   Patient has no known allergies.   Social History   Tobacco Use   Smoking status: Passive Smoke Exposure - Never Smoker   Smokeless tobacco: Never Used  Substance Use Topics   Alcohol use: No    Frequency: Never   Drug use: No     Family Hx: The patient's family history includes Cancer in her sister; Diabetes in her father; Heart disease in her father and mother; Hypertension in her father and mother; Kidney failure in her mother.  ROS:   Please see the history of present illness.     All other systems reviewed and are negative.   Prior CV studies:   The following studies were reviewed today:   Echo 03/29/18 Mod conc LVH, EF 60-65, no RWMA, Gr 1 DD, s/p TAVR with trivial paravalvular  leak and mean gradient 12, severe MS (mean 13), mod LAE, mild TR, mild PI  Cardiac Catheterization 02/09/17 LAD prox 30, dist 20; D1 ost 40, D2 ost 30 LCx ost 70; OM3 40 RCA mid 20, 40 1. Single vessel CAD with moderately severe, calcified stenosis in the proximal Circumflex artery 2. Mild non-obstructive disease in the RCA and LAD 3. Severe aortic stenosis (mean gradient 18.64mmHg, peak gradient 35 mmHg, AVA 1.26 cm2 ).   Carotid US 03/01/17 Bilateral ICA 1-39  Labs/Other Tests and Data Reviewed:    EKG:  No ECG reviewed.  Recent Labs: No results found for requested labs within last 8760 hours.   Recent Lipid Panel No results found for: CHOL, TRIG, HDL, CHOLHDL, LDLCALC, LDLDIRECT      Wt Readings from Last 3 Encounters:  09/12/18 93 lb 6.4 oz (42.4 kg)  03/29/18 101 lb 12.8 oz (46.2 kg)  08/04/17 109 lb 1.9 oz (49.5 kg)     Objective:    Vital Signs:  BP 130/67 (BP Location: Left Arm, Patient Position: Sitting, Cuff Size: Normal)    Ht 4\' 10"  (1.473 m)    Wt 93 lb 6.4 oz (42.4 kg)    BMI 19.52 kg/m    VITAL SIGNS:  reviewed GEN:  no acute distress EYES:  Sclera anicteric RESPIRATORY:  Normal respiratory effort NEURO:  Alert and oriented PSYCH:  normal affect  ASSESSMENT & PLAN:     Chronic diastolic congestive heart failure ([place]) Echo in December 2019 with EF 83-15 and mild diastolic dysfunction.  Overall, volume appears to be stable.  Continue current medical regimen.  Severe aortic stenosis / S/P TAVR (transcatheter aortic valve replacement) Normally functioning aortic valve prosthesis by echocardiogram December 2019.  Continue SBE prophylaxis.  CKD (chronic kidney disease) stage 4, GFR 15-29 ml/min (HCC) She is followed chronically by nephrology in [facility].  Chronic obstructive pulmonary disease, unspecified COPD type ([place]) She has had recent changes in her pulmonary medicines with improved breathing symptoms.  Coronary artery disease involving  native heart without angina pectoris, unspecified vessel or lesion type Mild to moderate disease noted by cardiac catheterization prior to her aortic valve replacement in October 2018.  She is not having anginal symptoms.  Continue aspirin, statin.  Mitral valve stenosis, unspecified etiology Severe mitral stenosis by echocardiogram December 2019.  Clinically, she is stable.  Continue current management and consider follow-up echocardiogram in  December 2020.  Essential hypertension The patient's blood pressure is controlled on her current regimen.  Continue current therapy.   COVID-19 Education: The signs and symptoms of COVID-19 were discussed with the patient and how to seek care for testing (follow up with PCP or arrange E-visit).  The importance of social distancing was discussed today.  Time:   Today, I have spent 11.5 minutes with the patient with telehealth technology discussing the above problems.     Medication Adjustments/Labs and Tests Ordered: Current medicines are reviewed at length with the patient today.  Concerns regarding medicines are outlined above.   Tests Ordered: No orders of the defined types were placed in this encounter.   Medication Changes: Meds ordered this encounter  Medications   chlorhexidine (PERIDEX) 0.12 % solution    Sig: Use as directed 15 mLs in the mouth or throat 2 (two) times daily.    Dispense:  480 mL    Refill:  1    Order Specific Question:   Supervising Provider    Answer:   Lelon Perla [[id number]]    Disposition:  Follow up in 6 month(s)  Signed, Richardson Dopp, PA-C  09/12/2018 4:56 PM    [place]

## 2018-09-12 ENCOUNTER — Telehealth (INDEPENDENT_AMBULATORY_CARE_PROVIDER_SITE_OTHER): Payer: Medicare HMO | Admitting: Physician Assistant

## 2018-09-12 ENCOUNTER — Encounter: Payer: Self-pay | Admitting: Physician Assistant

## 2018-09-12 ENCOUNTER — Other Ambulatory Visit: Payer: Self-pay

## 2018-09-12 VITALS — BP 130/67 | Ht <= 58 in | Wt 93.4 lb

## 2018-09-12 DIAGNOSIS — Z7189 Other specified counseling: Secondary | ICD-10-CM

## 2018-09-12 DIAGNOSIS — I350 Nonrheumatic aortic (valve) stenosis: Secondary | ICD-10-CM

## 2018-09-12 DIAGNOSIS — N184 Chronic kidney disease, stage 4 (severe): Secondary | ICD-10-CM

## 2018-09-12 DIAGNOSIS — Z952 Presence of prosthetic heart valve: Secondary | ICD-10-CM

## 2018-09-12 DIAGNOSIS — I10 Essential (primary) hypertension: Secondary | ICD-10-CM

## 2018-09-12 DIAGNOSIS — I5032 Chronic diastolic (congestive) heart failure: Secondary | ICD-10-CM | POA: Diagnosis not present

## 2018-09-12 DIAGNOSIS — I2510 Atherosclerotic heart disease of native coronary artery without angina pectoris: Secondary | ICD-10-CM

## 2018-09-12 DIAGNOSIS — J449 Chronic obstructive pulmonary disease, unspecified: Secondary | ICD-10-CM

## 2018-09-12 DIAGNOSIS — I050 Rheumatic mitral stenosis: Secondary | ICD-10-CM

## 2018-09-12 MED ORDER — CHLORHEXIDINE GLUCONATE 0.12 % MT SOLN: 15.0000 mL | Freq: Two times a day (BID) | OROMUCOSAL | 1 refills | Status: DC

## 2018-09-12 NOTE — Patient Instructions (Addendum)
Medication Instructions:  Continue current medications.  If you need a refill on your cardiac medications before your next appointment, please call your pharmacy.   Lab work: None  If you have labs (blood work) drawn today and your tests are completely normal, you will receive your results only by: Marland Kitchen MyChart Message (if you have MyChart) OR . A paper copy in the mail If you have any lab test that is abnormal or we need to change your treatment, we will call you to review the results.  Testing/Procedures: None  Follow-Up: At [facility], you and your health needs are our priority.  As part of our continuing mission to provide you with exceptional heart care, we have created designated Provider Care Teams.  These Care Teams include your primary Cardiologist (physician) and Advanced Practice Providers (APPs -  Physician Assistants and Nurse Practitioners) who all work together to provide you with the care you need, when you need it. You will need a follow up appointment in:  6 months.  Please call our office 2 months in advance to schedule this appointment.  You may see [name], MD or Richardson Dopp, PA-C   Any Other Special Instructions Will Be Listed Below (If Applicable).

## 2018-09-13 ENCOUNTER — Telehealth: Payer: Medicare HMO | Admitting: Physician Assistant

## 2018-12-19 ENCOUNTER — Other Ambulatory Visit: Payer: Self-pay | Admitting: Cardiovascular Disease

## 2019-03-09 ENCOUNTER — Telehealth: Payer: Self-pay | Admitting: Cardiovascular Disease

## 2019-03-09 NOTE — Telephone Encounter (Signed)
Lpm with approval for the patient's uncle to accompany to appointment on 11/18 due to mobility.

## 2019-03-09 NOTE — Telephone Encounter (Signed)
New Message  Patient is calling to get approval to have her uncle accompany her to her appointment on 03/14/19 at 10:00 am due to patient having a walker and needing assistance with walking and opening doors. Please give patient a call back to confirm.

## 2019-03-14 ENCOUNTER — Ambulatory Visit: Payer: Medicare HMO | Admitting: Cardiology

## 2019-03-19 ENCOUNTER — Other Ambulatory Visit: Payer: Self-pay

## 2019-04-09 ENCOUNTER — Telehealth: Payer: Self-pay

## 2019-04-09 NOTE — Telephone Encounter (Signed)
   Primary [place], MD  Chart reviewed as part of pre-operative protocol coverage. Patient has a visit with Pecolia Ades, NP 04/12/2019, at which time her preop status can be addressed. Will route this to Gae Bon as an FYI and remove from the preop pool.    Abigail Butts, PA-C  04/09/2019, 4:08 PM

## 2019-04-09 NOTE — Telephone Encounter (Signed)
   [facility] Pre-operative Risk Assessment    Request for surgical clearance:  1. What type of surgery is being performed? Colonoscopy   2. When is this surgery scheduled? TBD   3. What type of clearance is required (medical clearance vs. Pharmacy clearance to hold med vs. Both)? Medical  4. Are there any medications that need to be held prior to surgery and how long? No   5. Practice name and name of physician performing surgery? [facility]   6. What is your office phone number [phone]    7.   What is your office fax number [phone]  8.   Anesthesia type (None, local, MAC, general) ? None listed   Mady Haagensen 04/09/2019, 3:04 PM  _________________________________________________________________   (provider comments below)

## 2019-04-12 ENCOUNTER — Ambulatory Visit: Payer: Medicare HMO | Admitting: Cardiology

## 2019-04-25 ENCOUNTER — Encounter: Payer: Self-pay | Admitting: *Deleted

## 2019-04-25 DIAGNOSIS — E119 Type 2 diabetes mellitus without complications: Secondary | ICD-10-CM | POA: Insufficient documentation

## 2019-05-04 ENCOUNTER — Ambulatory Visit: Payer: Medicare HMO | Admitting: Cardiology

## 2019-05-10 ENCOUNTER — Ambulatory Visit: Payer: Medicare PPO | Admitting: Cardiology

## 2019-05-15 NOTE — Telephone Encounter (Signed)
Pt did not come for appt on 04/12/2019.  Currently rescheduled for appt with Pecolia Ades, NP 06/06/2019.

## 2019-06-06 ENCOUNTER — Encounter: Payer: Self-pay | Admitting: Cardiology

## 2019-06-06 ENCOUNTER — Ambulatory Visit (INDEPENDENT_AMBULATORY_CARE_PROVIDER_SITE_OTHER): Payer: Medicare PPO | Admitting: Family Medicine

## 2019-06-06 ENCOUNTER — Other Ambulatory Visit: Payer: Self-pay

## 2019-06-06 VITALS — BP 122/74 | HR 72 | Ht <= 58 in | Wt 101.4 lb

## 2019-06-06 DIAGNOSIS — Z952 Presence of prosthetic heart valve: Secondary | ICD-10-CM

## 2019-06-06 DIAGNOSIS — I2510 Atherosclerotic heart disease of native coronary artery without angina pectoris: Secondary | ICD-10-CM | POA: Diagnosis not present

## 2019-06-06 DIAGNOSIS — E11319 Type 2 diabetes mellitus with unspecified diabetic retinopathy without macular edema: Secondary | ICD-10-CM

## 2019-06-06 DIAGNOSIS — I10 Essential (primary) hypertension: Secondary | ICD-10-CM

## 2019-06-06 DIAGNOSIS — I5032 Chronic diastolic (congestive) heart failure: Secondary | ICD-10-CM | POA: Diagnosis not present

## 2019-06-06 DIAGNOSIS — I350 Nonrheumatic aortic (valve) stenosis: Secondary | ICD-10-CM | POA: Diagnosis not present

## 2019-06-06 NOTE — Patient Instructions (Signed)
Medication Instructions:  Your physician recommends that you continue on your current medications as directed. Please refer to the Current Medication list given to you today.  *If you need a refill on your cardiac medications before your next appointment, please call your pharmacy*  Lab Work: None ordered   If you have labs (blood work) drawn today and your tests are completely normal, you will receive your results only by: Marland Kitchen MyChart Message (if you have MyChart) OR . A paper copy in the mail If you have any lab test that is abnormal or we need to change your treatment, we will call you to review the results.  Testing/Procedures: Your physician has requested that you have an echocardiogram. Echocardiography is a painless test that uses sound waves to create images of your heart. It provides your doctor with information about the size and shape of your heart and how well your heart's chambers and valves are working. This procedure takes approximately one hour. There are no restrictions for this procedure.    Follow-Up: At [facility], you and your health needs are our priority.  As part of our continuing mission to provide you with exceptional heart care, we have created designated Provider Care Teams.  These Care Teams include your primary Cardiologist (physician) and Advanced Practice Providers (APPs -  Physician Assistants and Nurse Practitioners) who all work together to provide you with the care you need, when you need it.  Your next appointment:   6 month(s)  The format for your next appointment:   In Person  Provider:   You may see [name], MD or one of the following Advanced Practice Providers on your designated Care Team:    Richardson Dopp, PA-C  [place], [place]  Daune Perch, NP   Other Instructions None

## 2019-06-06 NOTE — Progress Notes (Signed)
Cardiology Office Note  Date: 06/06/2019   ID: Sonya Carpenter, Sonya Carpenter 03/12/1946, MRN [id number]  PCP:  Sonya Heinrich, DO  Cardiologist:  Sonya Mocha, MD Electrophysiologist:  None   Chief Complaint  Patient presents with  . Follow-up    Status post TAVR, hypertension, chronic diastolic heart failure, mitral stenosis, coronary artery disease    History of Present Illness: Sonya Carpenter is a 74 y.o. female history of coronary artery disease, diastolic CHF, valvular heart disease with mitral and aortic valve disease, prior GI bleed, CKD stage IV, hypertension, asthma/COPD, anemia, gallbladder disease, TAVR 2018.  After acute decompensated heart failure December 2018 followed by Sonya Carpenter in [place] clinic till May 2019.  Now followed by Dr. Burt Carpenter.  Patient denies any recent acute illnesses, hospitalizations, travels.  Denies any exposure to Covid virus or any Covid symptoms.  No complaints of progressive angina or dyspnea-like symptoms.  She walks with a walker.  Denies any palpitations or arrhythmias, orthostatic symptoms/syncopal or near syncopal episodes, stroke or TIA-like symptoms, dyspeptic symptoms or blood in stool or urine.  Denies any claudication-like symptoms, DVT or PE-like symptoms, or lower extremity edema.  She sees nephrology Dr. Marlowe Carpenter chronic kidney disease stage IV.  She states she has a pending colonoscopy and gastrointestinal physician will contact us for clearance.  Patient's primary care physician is Dr. Michell Carpenter in [place].    Past Medical History:  Diagnosis Date  . Anemia   . Aortic stenosis   . Arthritis    back (04/14/2017)  . Asthma   . CAD (coronary artery disease)   . Cholecystitis 03/09/2017  . Chronic bronchitis ([place])   . Chronic diastolic congestive heart failure ([place])   . CKD (chronic kidney disease), stage IV ([place])   . COPD (chronic obstructive pulmonary disease) ([place])   . Diabetic retinopathy ([place]) 11/24/2016  . Gallbladder  disease   . GERD (gastroesophageal reflux disease)   . Heart murmur 07/15/2015  . History of blood transfusion "several times"   "low HgB"  . History of hiatal hernia   . Hyperlipidemia   . Hypertension   . Mitral stenosis   . 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 Type II diabetes mellitus ([place])   . Vitamin D deficiency     Past Surgical History:  Procedure Laterality Date  . BREAST CYST EXCISION Right   . CARDIAC CATHETERIZATION    . CATARACT EXTRACTION W/ INTRAOCULAR LENS  IMPLANT, BILATERAL Bilateral   . COLONSCOPY    . ESOPHAGOGASTRODUODENOSCOPY N/A 04/01/2017   Procedure: ESOPHAGOGASTRODUODENOSCOPY (EGD);  Surgeon: Sonya Banister, MD;  Location: [facility] ENDOSCOPY;  Service: Endoscopy;  Laterality: N/A;  . FRACTURE SURGERY    . MULTIPLE EXTRACTIONS WITH ALVEOLOPLASTY N/A 03/14/2017   Procedure: Extraction of tooth #'s 2-11, 15, 19-28 and 31 with alveoloplasty, bilateral mandibular tori reductions and bilateral mandibular lingual exostoses reductions.;  Surgeon: Sonya Carpenter, DDS;  Location: [place];  Service: Oral Surgery;  Laterality: N/A;  . PATELLA FRACTURE SURGERY Right   . RETINAL LASER PROCEDURE Bilateral   . RIGHT/LEFT HEART CATH AND CORONARY ANGIOGRAPHY N/A 02/09/2017   Procedure: RIGHT/LEFT HEART CATH AND CORONARY ANGIOGRAPHY;  Surgeon: Sonya Blanks, MD;  Location: [facility];  Service: Cardiovascular;  Laterality: N/A;  . TEE WITHOUT CARDIOVERSION N/A 03/29/2017   Procedure: TRANSESOPHAGEAL ECHOCARDIOGRAM (TEE);  Surgeon: Sonya Mocha, MD;  Location:  [place] OR;  Service: Open Heart Surgery;  Laterality: N/A;  . TONSILLECTOMY    . TRANSCATHETER AORTIC VALVE REPLACEMENT, TRANSFEMORAL N/A 03/29/2017   Procedure: TRANSCATHETER AORTIC VALVE REPLACEMENT, TRANSFEMORAL;  Surgeon: Sonya Mocha, MD;  Location: [place];  Service: Open Heart  Surgery;  Laterality: N/A;    Current Outpatient Medications  Medication Sig Dispense Refill  . acetaminophen (TYLENOL) 500 MG tablet Take 500 mg by mouth every 6 (six) hours as needed for mild pain or moderate pain.    Marland Kitchen albuterol (PROVENTIL HFA;VENTOLIN HFA) 108 (90 Base) MCG/ACT inhaler Inhale 2 puffs every 6 (six) hours as needed into the lungs for wheezing or shortness of breath.     Marland Kitchen aspirin EC 81 MG EC tablet Take 1 tablet (81 mg total) by mouth daily.    Marland Kitchen atorvastatin (LIPITOR) 20 MG tablet Take 20 mg by mouth at bedtime.     Marland Kitchen azelastine (ASTELIN) 0.1 % nasal spray Place 2 sprays into both nostrils 2 (two) times daily. Use in each nostril as directed    . BREO ELLIPTA 200-25 MCG/INH AEPB Inhale 1 puff into the lungs daily.    . chlorhexidine (PERIDEX) 0.12 % solution Use as directed 15 mLs in the mouth or throat 2 (two) times daily. 480 mL 1  . docusate sodium (COLACE) 100 MG capsule Take 100 mg by mouth 2 (two) times daily.    . ferrous sulfate 325 (65 FE) MG tablet Take 1 tablet by mouth 2 (two) times daily.    . fluticasone (FLONASE) 50 MCG/ACT nasal spray Place 1 spray into both nostrils 2 (two) times a day.    . furosemide (LASIX) 20 MG tablet Take 20 mg by mouth every other day.    . Insulin Glargine (BASAGLAR KWIKPEN [place]) Inject 20 Units into the skin daily.    Marland Kitchen ipratropium (ATROVENT) 0.02 % nebulizer solution Take 0.5 mg by nebulization 2 (two) times daily.    . montelukast (SINGULAIR) 10 MG tablet Take 10 mg by mouth at bedtime.    . multivitamin (RENA-VIT) TABS tablet Take 1 tablet by mouth daily.    . pantoprazole (PROTONIX) 40 MG tablet Take 40 mg by mouth daily.    . Polyvinyl Alcohol-Povidone (CLEAR EYES NATURAL TEARS) 5-6 MG/ML SOLN Place 2 drops into both eyes 2 (two) times daily.    Marland Kitchen Propylene Glycol-Glycerin (ARTIFICIAL TEARS) 1-0.3 % SOLN Place 2 drops into both eyes daily as needed (for dry eyes).    . sodium chloride (OCEAN) 0.65 % SOLN nasal spray Place 2  sprays into both nostrils every 12 (twelve) hours as needed for congestion.     . traMADol (ULTRAM) 50 MG tablet Take 1 tablet (50 mg total) by mouth every 12 (twelve) hours as needed for moderate pain. 14 tablet 0  . Insulin Glargine, 1 Unit Dial, (TOUJEO SOLOSTAR) 300 UNIT/ML SOPN Inject 300 Units into the skin daily.     No current facility-administered medications for this visit.   Allergies:  Patient has no known allergies.   Social History: The patient  reports that she is a non-smoker but has been exposed to tobacco smoke. She has never used smokeless tobacco. She reports that she does not drink alcohol or use drugs.   Family History: The patient's family history includes Cancer in her sister; Diabetes in her father; Heart disease in her father and mother; Hypertension in her father and mother; Kidney failure in her mother.   ROS:  Please see the history of present  illness. Otherwise, complete review of systems is positive for none.  All other systems are reviewed and negative.   Physical Exam: VS:  There were no vitals taken for this visit., BMI There is no height or weight on file to calculate BMI.  Wt Readings from Last 3 Encounters:  09/12/18 93 lb 6.4 oz (42.4 kg)  03/29/18 101 lb 12.8 oz (46.2 kg)  08/04/17 109 lb 1.9 oz (49.5 kg)    General: Patient appears comfortable at rest. Neck: Supple, no elevated JVP or carotid bruits, no thyromegaly. Lungs: Clear to auscultation, nonlabored breathing at rest. Cardiac: Regular rate and rhythm, no S3 or significant systolic murmur, no pericardial rub. Extremities: No pitting edema, distal pulses 2+. Skin: Warm and dry. Musculoskeletal: No kyphosis. Neuropsychiatric: Alert and oriented x3, affect grossly appropriate.  ECG: EKG today shows normal sinus rhythm rate of 72, no acute ST or T wave abnormalities.  Recent Labwork:  Recent lab work from Dr. Michell Carpenter PCP on 03/07/2019 showed total cholesterol 184, HDL 49, triglycerides  260, LDL 98, non-HDL 135.  Hemoglobin A1c 6.7% equals average glucose 146, hemoglobin 10.7, hematocrit 32.9, platelets 127, creatinine 3.24, GFR 14, calcium 10.5, ALT 14, AST 18  Other Studies Reviewed Today: Echocardiogram 03/29/2018 Study Conclusions   - Left ventricle: The cavity size was normal. There was moderate concentric hypertrophy. Systolic function was normal. Theestimated ejection fraction was in the range of 60% to 65%. Wallmotion was normal; there were no regional wall motionabnormalities. Doppler parameters are consistent with abnormal left ventricular relaxation (grade 1 diastolic dysfunction).Doppler parameters are consistent with elevated ventricular end-diastolic filling pressure.  - Aortic valve: S/P TAVR with a 23 mm Edwards SAPIEN 3 valve. There was trivial paravalvular leak. Mean gradient (S): 12 mm Hg. Peak gradient (S): 23 mm Hg.  - Mitral valve: Mobility was restricted. The findings are  consistent with severe stenosis. Mean gradient (D): 13 mm Hg.  - Left atrium: The atrium was moderately dilated.  - Tricuspid valve: There was mild regurgitation.  - Pulmonic valve: There was mild regurgitation.  - Pulmonary arteries: Systolic pressure was within the normal range.  Impressions: - When compared to the prior study from 04/28/2017 there has been no significant change, transaortic velocities remain normal (peak/mean 22/12 mmHg) and there is trivial paravalvular leak.   Cardiac catheterization 02/09/2017   Mid RCA to Dist RCA lesion, 40 %stenosed.  Prox RCA to Mid RCA lesion, 20 %stenosed.  Ost Cx to Prox Cx lesion, 70 %stenosed.  Ost 3rd Mrg to 3rd Mrg lesion, 40 %stenosed.  Prox LAD to Mid LAD lesion, 30 %stenosed.  Dist LAD lesion, 20 %stenosed.  Ost 1st Diag lesion, 40 %stenosed.  Ost 2nd Diag to 2nd Diag lesion, 30 %stenosed.  There is severe aortic valve stenosis.   1. Single vessel CAD with moderately severe, calcified stenosis  in the proximal Circumflex artery 2. Mild non-obstructive disease in the RCA and LAD 3. Severe aortic stenosis (mean gradient 18.40mmHg, peak gradient 35 mmHg, AVA 1.26 cm2 )  Recommendations: Will review films with the TAVR team. We may be able to manage her CAD medically. Will continue workup for TAVR.   March 29, 2018 transcatheter Aortic Valve Replacement - Open Right Transfemoral Approach Edwards Sapien 3 Transcatheter Heart Valve (size 23 mm, model # 9600TFX, serial # [id number])               Assessment and Plan:  1. Severe aortic stenosis   2. Essential hypertension   3.  Chronic diastolic congestive heart failure ([place])   4. Coronary artery disease involving native heart without angina pectoris, unspecified vessel or lesion type    1. Severe aortic stenosis / Severe MV stenosis Status post TAVR December 2018.  Follow-up echo 12/4 2019 status post TAVR with 23 mm Edwards sapient 3 valve.  There was trivial paravalvular leak.  Mean gradient (S): 12 mmHg, peak gradient (S): 23 mmHg.  Severe mitral valve stenosis, grade 1 diastolic dysfunction, concentric LVH, moderate.  Patient denies any significant dyspnea, near syncopal or syncopal episodes, or anginal symptoms.  Get repeat echo  2. Essential hypertension Blood pressure elevated today on arrival.  Recheck in right arm 122/74.   3. Chronic diastolic congestive heart failure (HCC) Echocardiogram 03/29/2018 showed moderate concentric LVH, Doppler parameters were consistent with abnormal left ventricular relaxation (grade 1 diastolic dysfunction.  Doppler parameters consistent with elevated ventricular end-diastolic filling pressure.  Patient denies any dyspnea, weight gain, lower extremity edema.  Continue furosemide 20 mg every other day.  Monitor your weight daily.  Call if weight gain or increased shortness of breath/lower extremity edema.  Yes.  Maintain fluid restrictions of 2 L/day.Continue Lasix 20 mg QOD.  4. Coronary artery  disease involving native heart without angina pectoris, unspecified Cardiac catheterization 02/09/2017 single-vessel coronary artery disease with moderately severe, calcified stenosis and proximal circumflex, mild nonobstructive disease in RCA and LAD.  Patient denies any progressive angina or dyspnea.  She walks with a walker and is not very active on a daily basis.  Continue aspirin 81 mg, atorvastatin 20 mg.   Medication Adjustments/Labs and Tests Ordered: Current medicines are reviewed at length with the patient today.  Concerns regarding medicines are outlined above.    There are no Patient Instructions on file for this visit.       Signed, Levell July, NP 06/06/2019 10:13 AM    [facility]

## 2019-06-18 ENCOUNTER — Telehealth: Payer: Self-pay | Admitting: Cardiovascular Disease

## 2019-06-18 NOTE — Telephone Encounter (Signed)
  Patient has appt for an echo on 06/20/19 and since she is on a walker she would like to have a family member with her to assist her getting to her appt.

## 2019-06-20 ENCOUNTER — Ambulatory Visit (HOSPITAL_COMMUNITY): Payer: Medicare PPO | Attending: Cardiology

## 2019-06-20 ENCOUNTER — Other Ambulatory Visit: Payer: Self-pay

## 2019-06-20 DIAGNOSIS — I350 Nonrheumatic aortic (valve) stenosis: Secondary | ICD-10-CM | POA: Diagnosis present

## 2019-06-21 ENCOUNTER — Telehealth: Payer: Self-pay | Admitting: *Deleted

## 2019-06-21 NOTE — Telephone Encounter (Signed)
-----   Message from Verta Ellen., NP sent at 06/20/2019  4:09 PM EST ----- Please call patient and tell  her the echo is essentially unchanged from the previous one. She has significant Mitral Valve stenosis but at this point just requires periodic monitoring

## 2019-06-21 NOTE — Telephone Encounter (Signed)
Patient informed. Copy sent to PCP °

## 2019-09-16 IMAGING — CR DG CHEST 2V
2 series · 2 of 2 positions shown · non-contrast
Comparison: CT chest dated March 01, 2017.

CLINICAL DATA: Preoperative examination for aortic valve
replacement. History of aortic stenosis, CHF, and COPD.

EXAM:
CHEST  2 VIEW

[w chest lat]
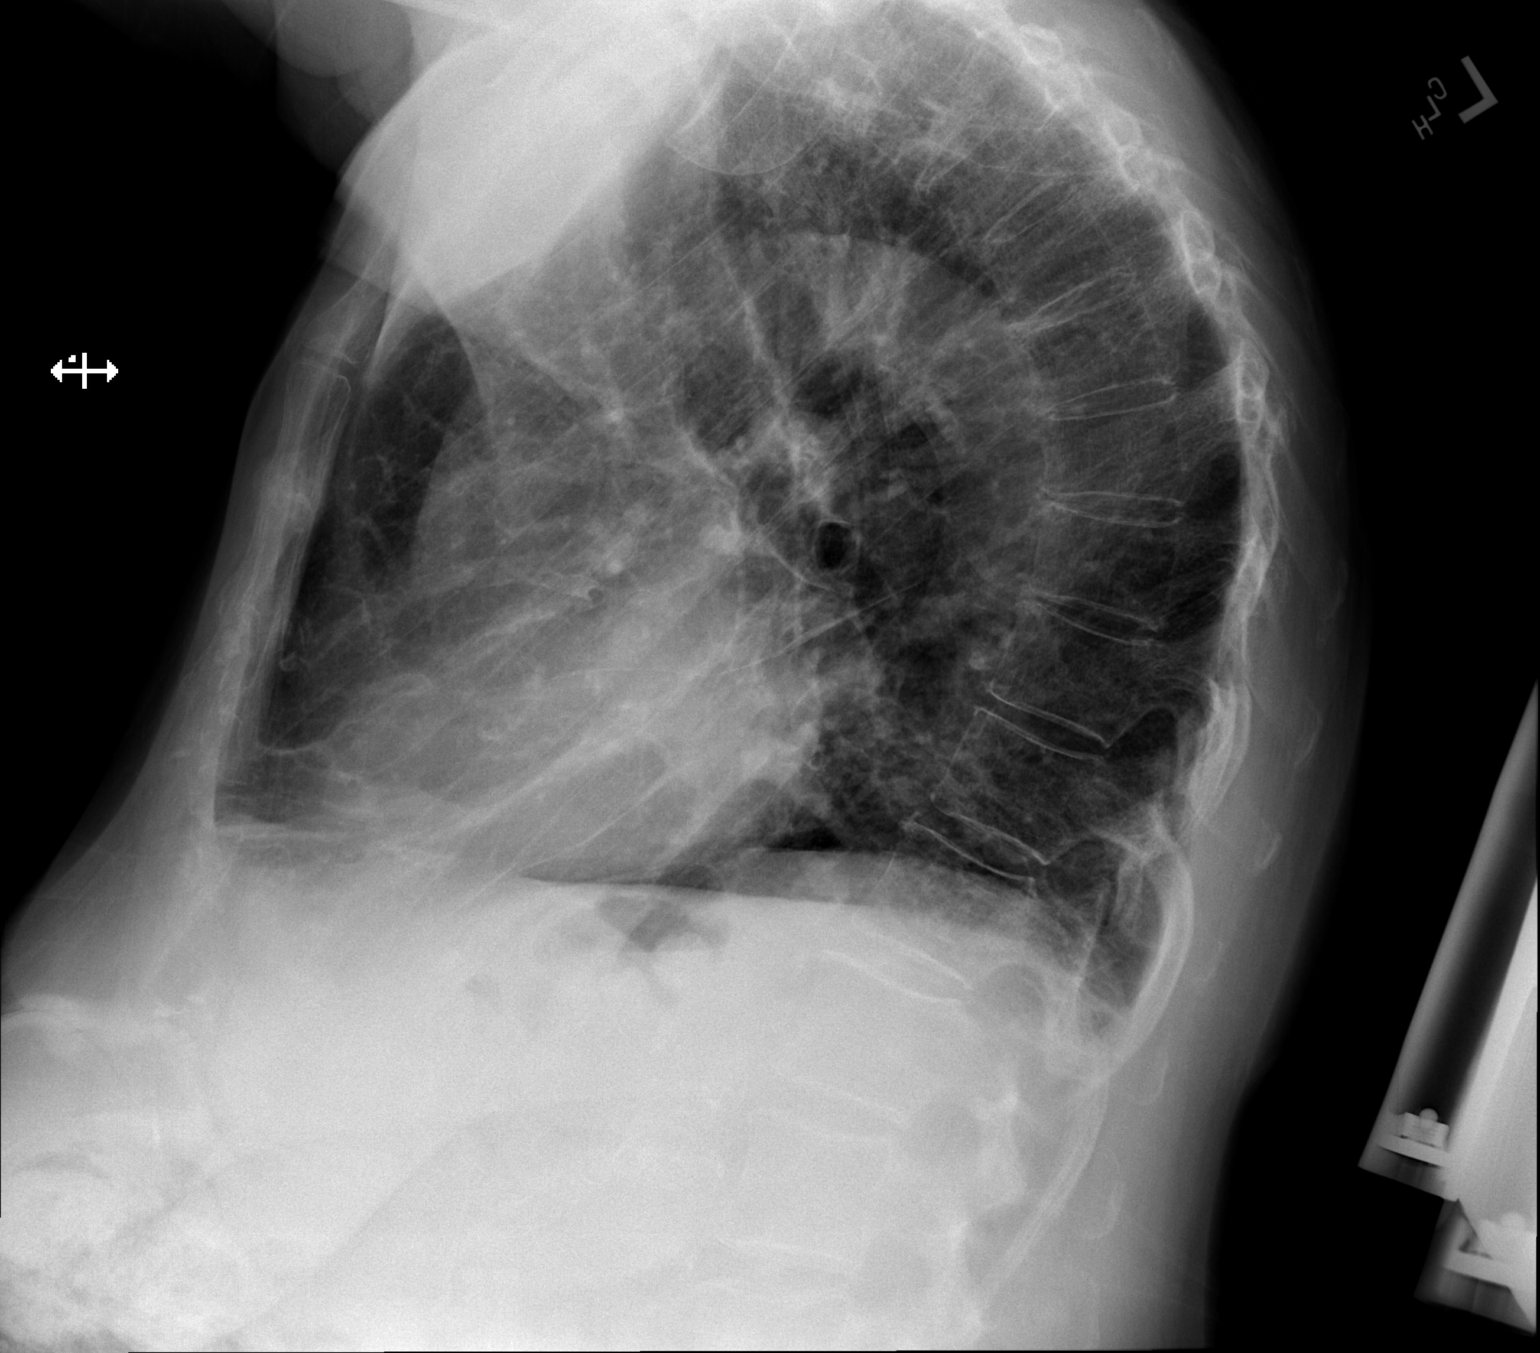

[w chest pa]
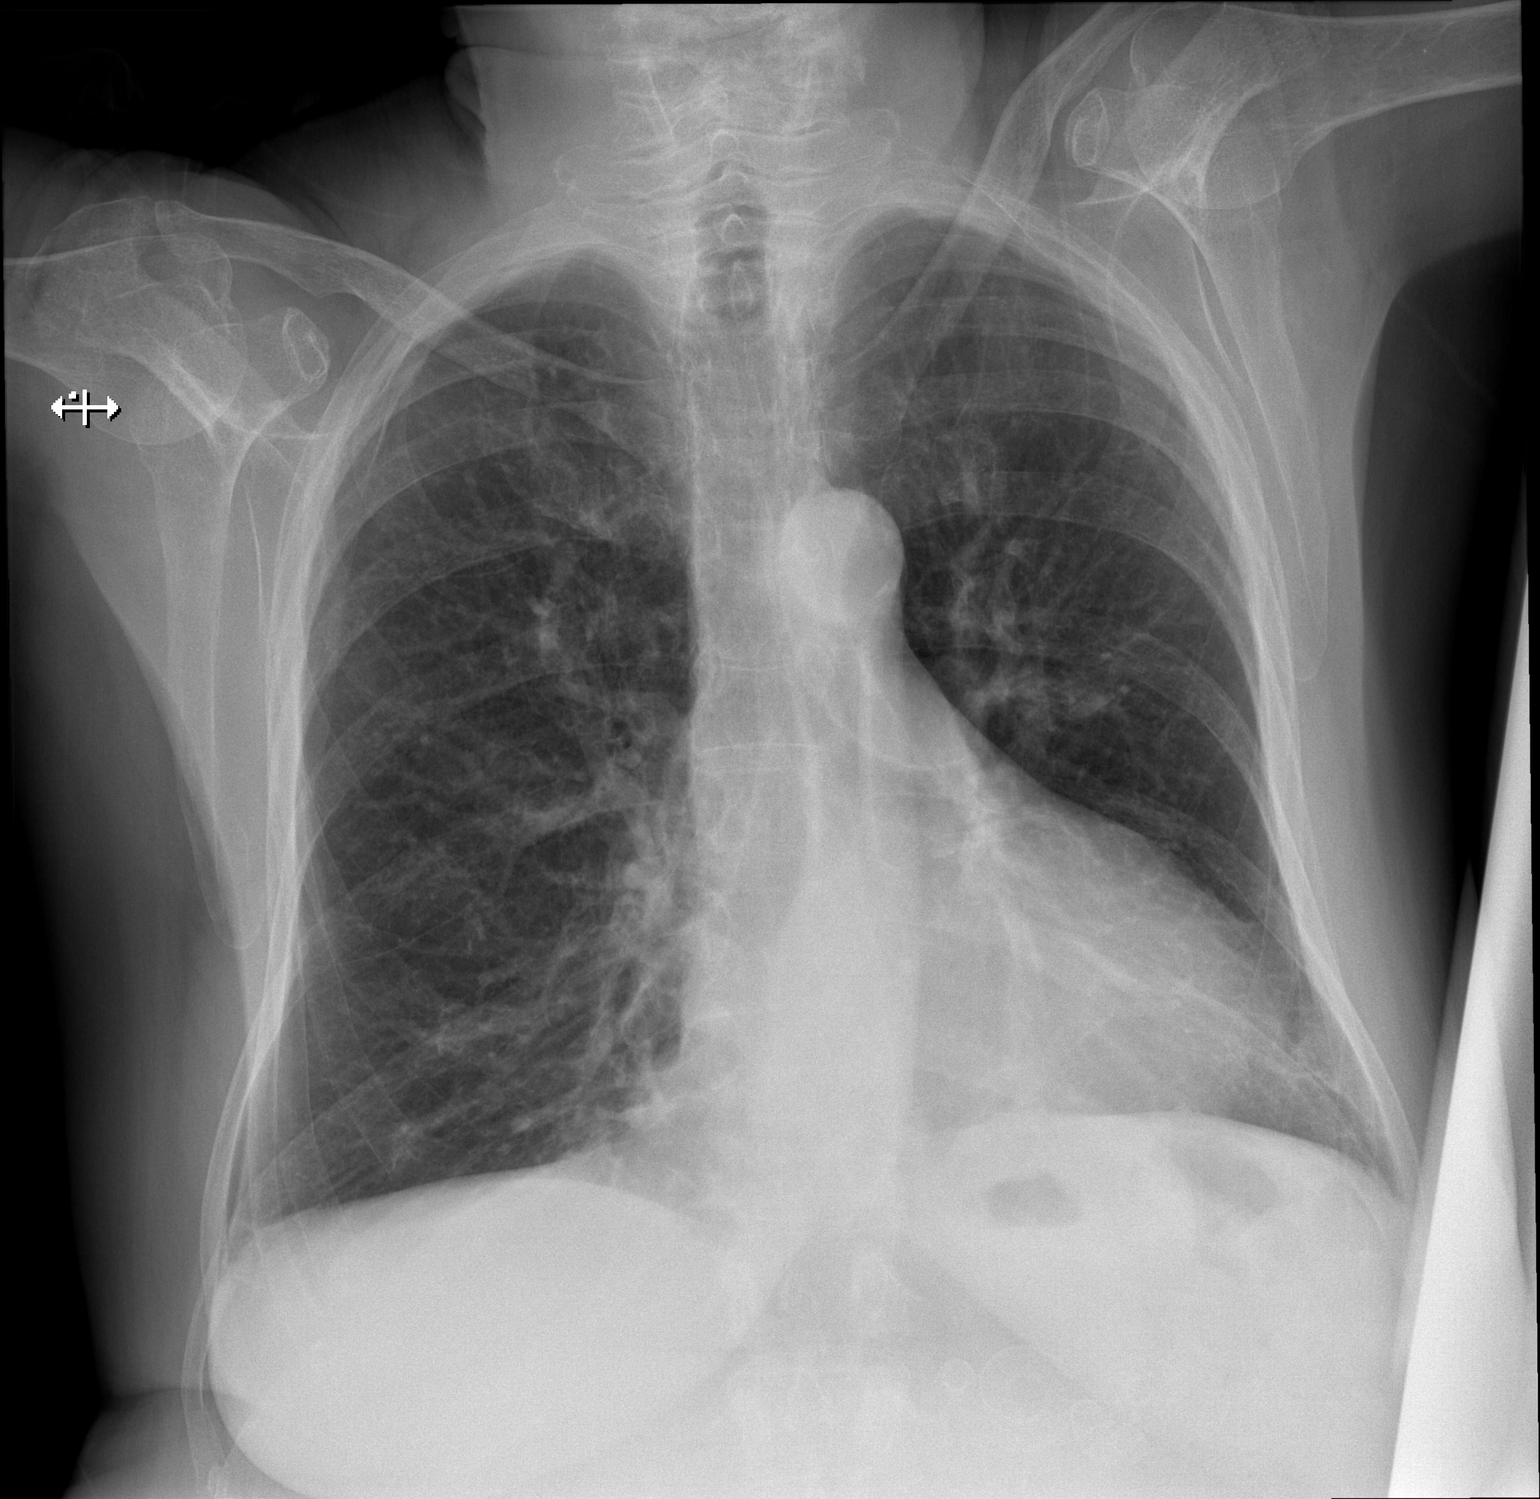

[2 of 2 positions shown; findings below may reference images not displayed]

FINDINGS: The cardiomediastinal silhouette is mildly enlarged. Normal
pulmonary vascularity. Streaky linear bibasilar
atelectasis/scarring. No focal consolidation, pleural effusion, or
pneumothorax. No acute osseous abnormality. Mild chronic appearing
wedging of a midthoracic vertebral body with associated exaggerated
thoracic kyphosis.
IMPRESSION: Mild cardiomegaly.  No active cardiopulmonary disease.

## 2019-09-20 IMAGING — DX DG CHEST 1V PORT
1 series · 1 of 1 positions shown · non-contrast
Comparison: 03/18/2017 chest radiograph and prior CTs

CLINICAL DATA: Central line placement.

EXAM:
PORTABLE CHEST 1 VIEW

[chest ap]
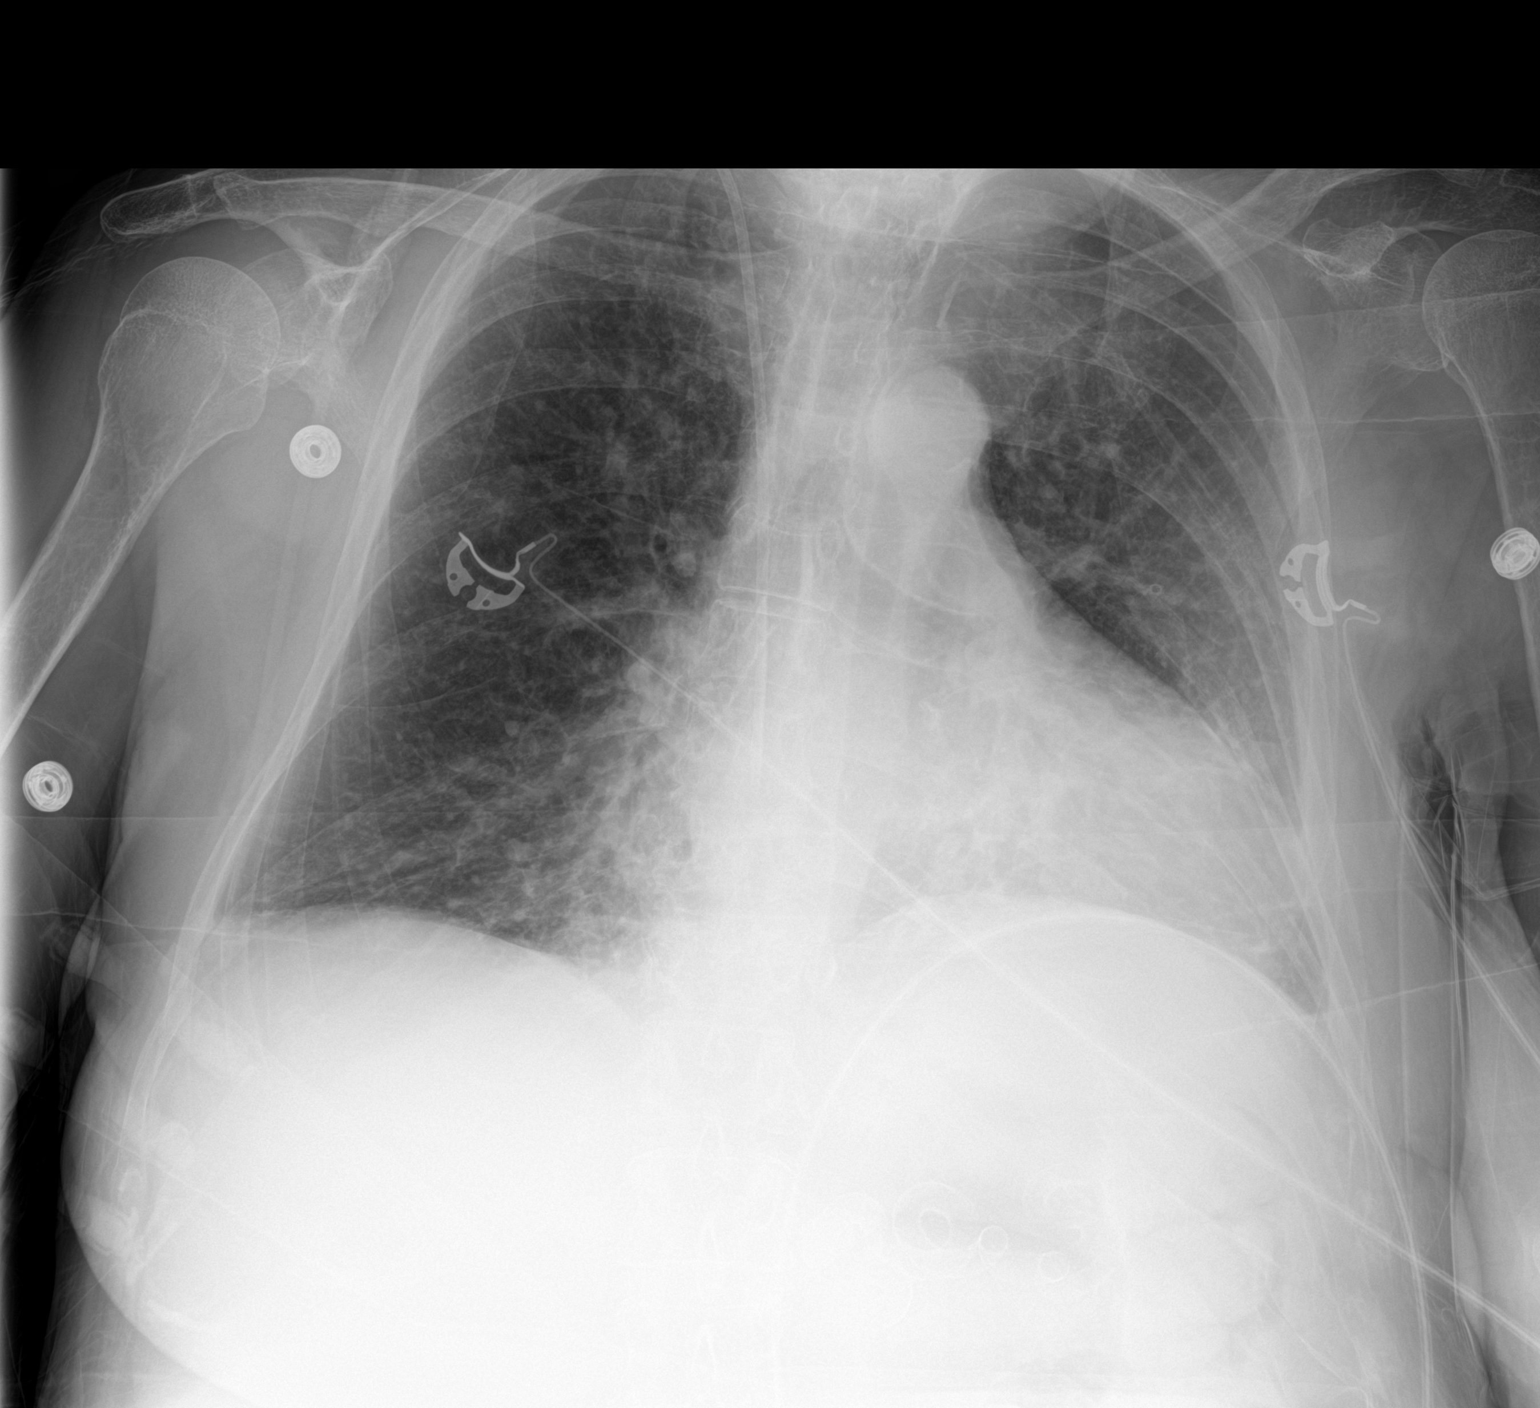

[1 of 1 positions shown; findings below may reference images not displayed]

FINDINGS: Cardiomegaly and pulmonary vascular congestion again noted.

A right IJ central venous catheter is present with tip overlying the
superior cavoatrial junction.

Mild bibasilar atelectasis/airspace disease noted.

There is no evidence of pneumothorax.
IMPRESSION: Right IJ central venous catheter placement with tip overlying the
superior cavoatrial junction.

Cardiomegaly, mild pulmonary vascular congestion and mild bibasilar
atelectasis/airspace disease.

## 2019-09-27 IMAGING — DX DG CHEST 1V PORT
1 series · 1 of 1 positions shown · non-contrast
Comparison: Portable exam 5488 hours compared 03/25/2017

CLINICAL DATA: Post TAVR, history asthma, coronary disease, CHF,
COPD, diabetes mellitus

EXAM:
PORTABLE CHEST 1 VIEW

[chest ap]
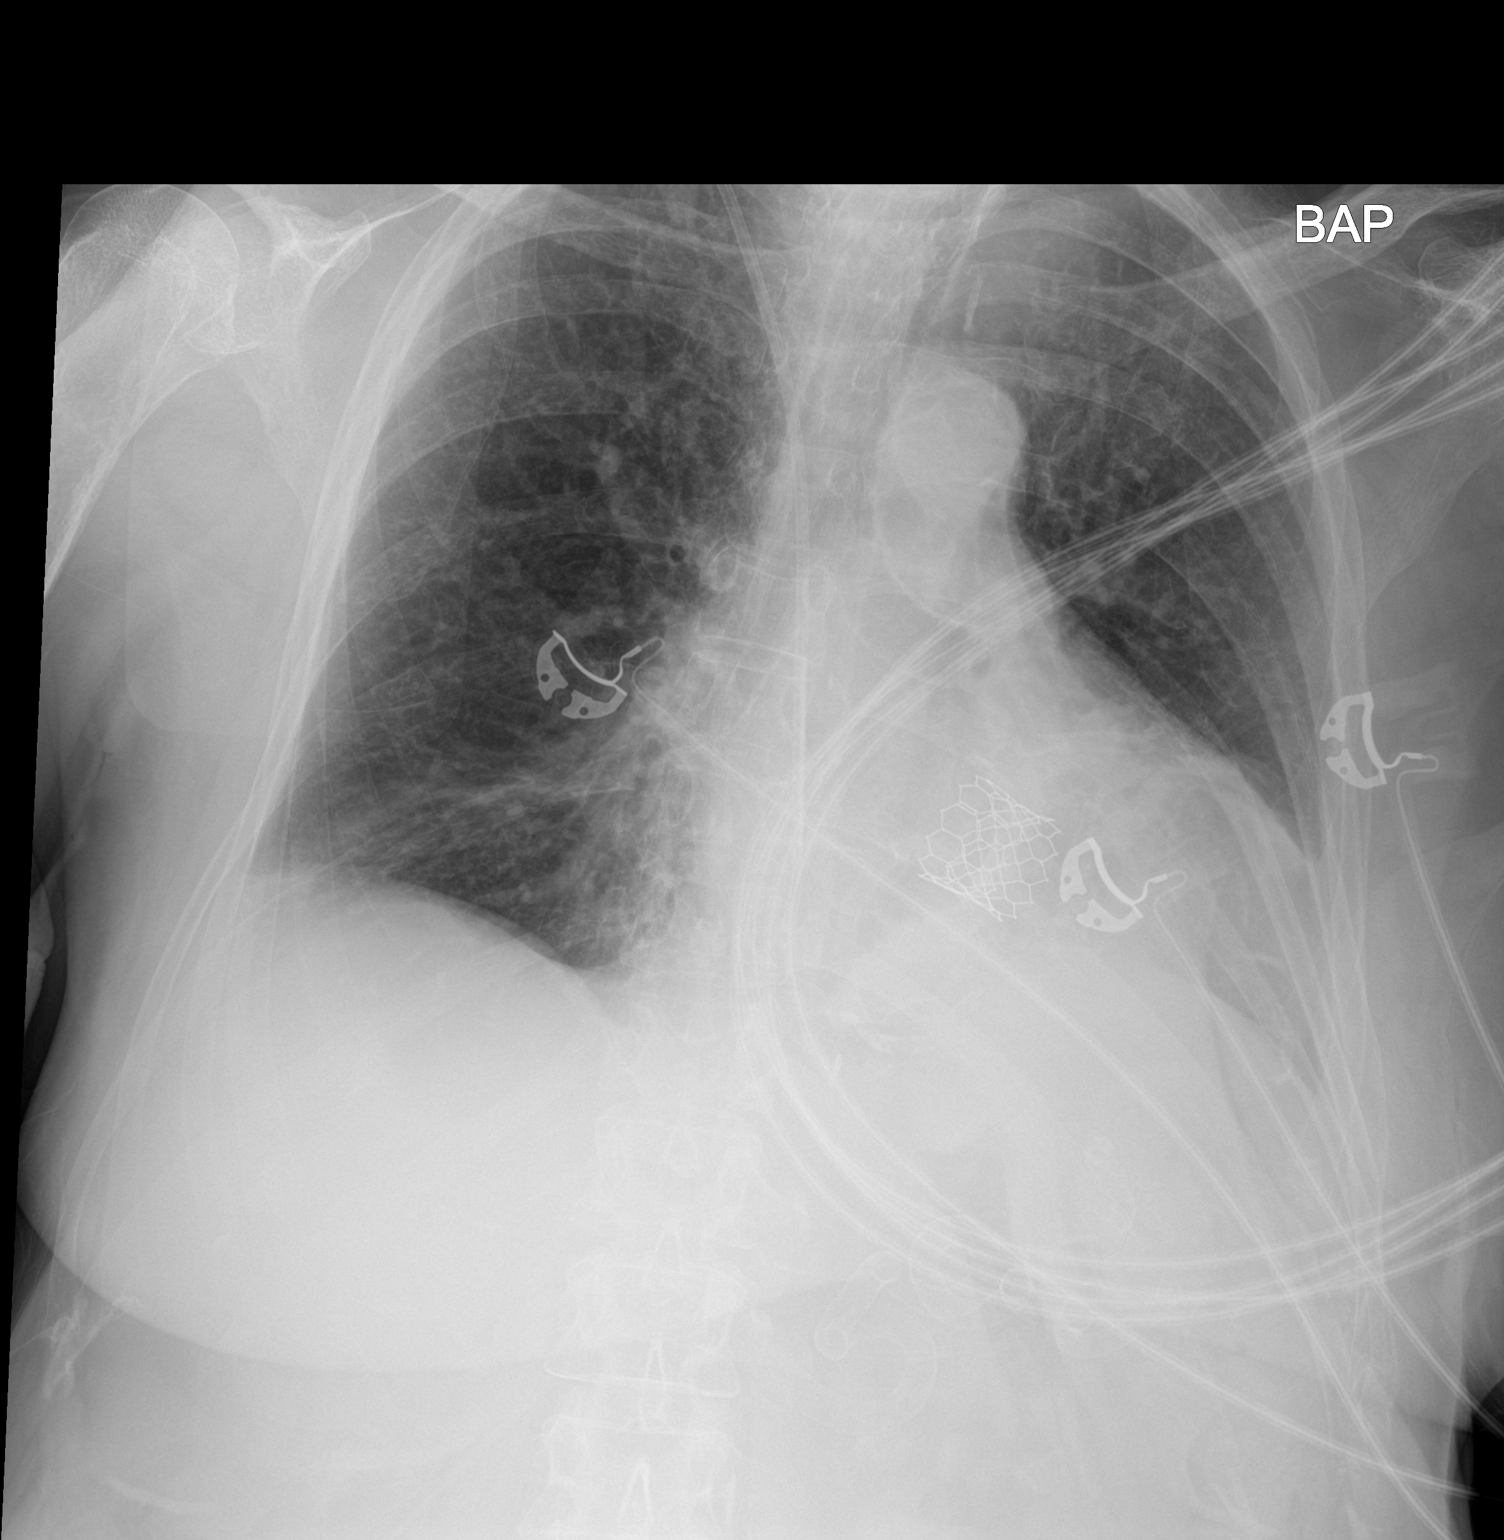

[1 of 1 positions shown; findings below may reference images not displayed]

FINDINGS: RIGHT jugular catheter with tip projecting over RIGHT atrium.

Rotated to the LEFT.

Upper normal size of cardiac silhouette post TAVR.

Mediastinal contours and pulmonary vascularity normal.

Atherosclerotic calcification aorta.

Bibasilar atelectasis greater on LEFT.

Lungs otherwise clear.

No definite infiltrate, pleural effusion or pneumothorax.

Bones demineralized.
IMPRESSION: Bibasilar atelectasis greater on LEFT.

## 2019-11-01 ENCOUNTER — Telehealth: Payer: Self-pay | Admitting: Cardiovascular Disease

## 2019-11-01 NOTE — Telephone Encounter (Signed)
Patient would need a routine echocardiogram again in 05/2020 unless she has a change in symptoms. Richardson Dopp, PA-C    11/01/2019 4:08 PM

## 2019-11-01 NOTE — Telephone Encounter (Signed)
This appears to be a [employer] patient.  Has follow up scheduled already with Richardson Dopp, PA on 12/18/19.  She also had last Echo on 06/20/2019.  Will fwd to current provider to see when he may want her to have next Echo.

## 2019-11-01 NOTE — Telephone Encounter (Signed)
Lm to call back ./cy 

## 2019-11-01 NOTE — Telephone Encounter (Signed)
Patient calling to find out if she needs to have an echo.

## 2019-11-02 ENCOUNTER — Telehealth: Payer: Self-pay | Admitting: Physician Assistant

## 2019-11-02 NOTE — Telephone Encounter (Signed)
Sent mychart message advising no echo needed until 2022.

## 2019-11-02 NOTE — Telephone Encounter (Signed)
Patient called back to see if she needed an echo. Per notes did advise patient no.

## 2019-12-17 NOTE — Progress Notes (Addendum)
Cardiology Office Note:    Date:  12/18/2019   ID:  Sonya Carpenter, DOB Jun 11, 1945, MRN [id number]  PCP:  Sonya Heinrich, DO  Cardiologist:  Sonya Mocha, MD   Electrophysiologist:  None  Nephrologist: Dr. Marlowe Carpenter in [place], [place]  Referring MD: Sonya Heinrich, DO   Chief Complaint:  Follow-up (Valvular Heart Disease; CHF)    Patient Profile:    Sonya Carpenter is a 74 y.o. female with:   Coronary artery disease   Diastolic CHF  Prior patient of CHF Clinic ([place]) - graduated in 07/2017   Valvular Heart Disease  Mitral stenosis  Aortic stenosis  S/p TAVR in 03/2017   Hx of GI bleeding   Chronic kidney disease stage 4  Diabetes mellitus   Hypertension   Asthma/COPD  Anemia   Gallbladder disease   Prior CV studies:   Echocardiogram 06/20/19 EF 60-65, no RWMA, mild LVH, Gr 2 DD, normal RVSF, severe LAE, mild RAE, trivial MR, likely severe MS (mean 6 mmHg and peak 15.5 mmHg), s/p TAVR functioning normally with trivial PVL   Echo 03/29/18 Mod conc LVH, EF 60-65, no RWMA, Gr 1 DD, s/p TAVR with trivial paravalvular leak and mean gradient 12, severe MS (mean 13), mod LAE, mild TR, mild PI  Cardiac Catheterization 02/09/17 LAD prox 30, dist 20; D1 ost 40, D2 ost 30 LCx ost 70; OM3 40 RCA mid 20, 40 1. Single vessel CAD with moderately severe, calcified stenosis in the proximal Circumflex artery 2. Mild non-obstructive disease in the RCA and LAD 3. Severe aortic stenosis (mean gradient 18.64mHg, peak gradient 35 mmHg, AVA 1.26 cm2 ).   Carotid [place]18 Bilateral ICA 1-39   History of Present Illness:    Sonya Carpenter last seen in clinic by Sonya Dung NP in 05/2019.  A follow up echocardiogram demonstrated normal LVF, stable TAVR and MS.  She returns for follow up.  She is here with her husband.  She has not had chest discomfort or significant shortness of breath.  She sleeps in a recliner and has done so for years.  She has not had lower extremity  swelling or syncope.  She notes that she is to get an AV fistula placed soon.  It sounds as though she is approaching dialysis.     Past Medical History:  Diagnosis Date  . Anemia   . Aortic stenosis   . Arthritis    back (04/14/2017)  . Asthma   . CAD (coronary artery disease)   . Cholecystitis 03/09/2017  . Chronic bronchitis ([place]   . Chronic diastolic congestive heart failure ([place]   . CKD (chronic kidney disease), stage IV ([place]   . COPD (chronic obstructive pulmonary disease) ([place]   . Diabetic retinopathy ([place] 11/24/2016  . Gallbladder disease   . GERD (gastroesophageal reflux disease)   . Heart murmur 07/15/2015  . History of blood transfusion "several times"   "low HgB"  . History of hiatal hernia   . Hyperlipidemia   . Hypertension   . Mitral stenosis   . 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 Type II diabetes mellitus ([place]   . Vitamin D deficiency     Current Medications: Current Meds  Medication Sig  . ACCU-CHEK AVIVA PLUS test strip 3 each by Other route daily.  .Marland Kitchenacetaminophen (TYLENOL) 500 MG tablet Take 500 mg by mouth every 6 (six) hours  as needed for mild pain or moderate pain.  Marland Kitchen albuterol (VENTOLIN HFA) 108 (90 Base) MCG/ACT inhaler Inhale 2 puffs into the lungs 4 (four) times daily as needed.  Marland Kitchen aspirin EC 81 MG EC tablet Take 1 tablet (81 mg total) by mouth daily.  Marland Kitchen atorvastatin (LIPITOR) 20 MG tablet Take 20 mg by mouth at bedtime.   Marland Kitchen azelastine (ASTELIN) 0.1 % nasal spray Place 2 sprays into both nostrils 2 (two) times daily. Use in each nostril as directed  . Blood Glucose Monitoring Suppl (ACCU-CHEK AVIVA PLUS) w/Device KIT 1 Device by Other route as needed.  Marland Kitchen BREO ELLIPTA 200-25 MCG/INH AEPB Inhale 1 puff into the lungs daily.  Marland Kitchen docusate sodium (COLACE) 100 MG capsule Take 100 mg by mouth 2 (two) times daily.  . DROPLET PEN  NEEDLES 31G X 8 MM MISC 1 each by Other route as needed.  . fluticasone (VERAMYST) 27.5 MCG/SPRAY nasal spray Place 1 spray into the nose in the morning and at bedtime.  . furosemide (LASIX) 20 MG tablet Take 20 mg by mouth every other day.  . insulin glargine, 1 Unit Dial, (TOUJEO SOLOSTAR) 300 UNIT/ML Solostar Pen Inject 25 Units into the skin daily.  . montelukast (SINGULAIR) 10 MG tablet Take 10 mg by mouth at bedtime.  . multivitamin (RENA-VIT) TABS tablet Take 1 tablet by mouth daily.  . pantoprazole (PROTONIX) 40 MG tablet Take 40 mg by mouth daily.  . polyethylene glycol powder (GLYCOLAX/MIRALAX) 17 GM/SCOOP powder Take 17 g by mouth daily.  Marland Kitchen Propylene Glycol-Glycerin (ARTIFICIAL TEARS) 1-0.3 % SOLN Place 2 drops into both eyes daily as needed (for dry eyes).  . sevelamer carbonate (RENVELA) 800 MG tablet Take 1,600 mg by mouth in the morning and at bedtime.  . sodium bicarbonate 650 MG tablet Take 1 tablet by mouth in the morning and at bedtime.     Allergies:   Patient has no known allergies.   Social History   Tobacco Use  . Smoking status: Passive Smoke Exposure - Never Smoker  . Smokeless tobacco: Never Used  Vaping Use  . Vaping Use: Never used  Substance Use Topics  . Alcohol use: No  . Drug use: No     Family Hx: The patient's family history includes Cancer in her sister; Diabetes in her father; Heart disease in her father and mother; Hypertension in her father and mother; Kidney failure in her mother.  ROS   EKGs/Labs/Other Test Reviewed:    EKG:  EKG is   ordered today.  The ekg ordered today demonstrates normal sinus rhythm, heart rate 64, left axis deviation, no ST-T wave changes, QTC 431, similar to prior tracing  Recent Labs: Labs from nephrology 12/11/2019 (personally reviewed and interpreted): K 3.8, creatinine 3.65, Hgb 9.4  Recent Lipid Panel No results found for: CHOL, TRIG, HDL, CHOLHDL, LDLCALC, LDLDIRECT  Physical Exam:    VS:  BP 132/60    Pulse 64   Ht 4' 10"  (1.473 m)   Wt 96 lb (43.5 kg)   SpO2 98%   BMI 20.06 kg/m     Wt Readings from Last 3 Encounters:  12/18/19 96 lb (43.5 kg)  06/06/19 101 lb 6.4 oz (46 kg)  09/12/18 93 lb 6.4 oz (42.4 kg)     Constitutional:      Appearance: Healthy appearance. Not in distress.  Pulmonary:     Effort: Pulmonary effort is normal.     Breath sounds: No wheezing. No rales.  Cardiovascular:  Normal rate. Regular rhythm. Normal S1. Normal S2.     Murmurs: There is a grade 1/6 early systolic murmur at the URSB.  Edema:    Peripheral edema absent.  Abdominal:     Palpations: Abdomen is soft.  Musculoskeletal:     Cervical back: Neck supple. Skin:    General: Skin is warm and dry.  Neurological:     Mental Status: Alert and oriented to person, place and time.     Cranial Nerves: Cranial nerves are intact.       ASSESSMENT & PLAN:    1. Chronic diastolic congestive heart failure (HCC) Echocardiogram 05/2019 with EF 71-82 and mod diastolic dysfunction.  Volume status appears stable.  Continue current dose of Furosemide.    2. Severe aortic stenosis 3. S/P TAVR (transcatheter aortic valve replacement) Normally functioning TAVR by echocardiogram in 05/2019.  No PVL.  Continue SBE prophylaxis.    4. Mitral valve stenosis, unspecified etiology Severe by most recent echocardiogram.  Gradients were stable compared to prior echocardiograms.  She is not symptomatic.  Arrange follow up echocardiogram same day as next visit with Dr. Burt Knack in 6 mos.    5. Coronary artery disease involving native heart without angina pectoris, unspecified vessel or lesion type Moderate non-obstructive disease by cardiac catheterization in 2018.  She is not having angina.  Continue ASA, Atorvastatin.    6. CKD (chronic kidney disease) stage 4, GFR 15-29 ml/min (HCC) Followed by Nephrology in [place].  It sounds like she is approaching dialysis and is to get an AVF soon.      Dispo:  Return in  about 6 months (around 06/19/2020) for Routine Follow Up, w/ Dr. Burt Knack, in person.   Medication Adjustments/Labs and Tests Ordered: Current medicines are reviewed at length with the patient today.  Concerns regarding medicines are outlined above.  Tests Ordered: Orders Placed This Encounter  Procedures  . EKG 12-Lead  . ECHOCARDIOGRAM COMPLETE   Medication Changes: No orders of the defined types were placed in this encounter.   Signed, Richardson Dopp, PA-C  12/18/2019 9:46 AM    [facility] [place], [place], [place]  [address] Phone: [phone]; Fax: [phone]

## 2019-12-18 ENCOUNTER — Encounter: Payer: Self-pay | Admitting: Physician Assistant

## 2019-12-18 ENCOUNTER — Other Ambulatory Visit: Payer: Self-pay

## 2019-12-18 ENCOUNTER — Ambulatory Visit: Payer: Medicare PPO | Admitting: Physician Assistant

## 2019-12-18 VITALS — BP 132/60 | HR 64 | Ht <= 58 in | Wt 96.0 lb

## 2019-12-18 DIAGNOSIS — I2510 Atherosclerotic heart disease of native coronary artery without angina pectoris: Secondary | ICD-10-CM

## 2019-12-18 DIAGNOSIS — I050 Rheumatic mitral stenosis: Secondary | ICD-10-CM | POA: Diagnosis not present

## 2019-12-18 DIAGNOSIS — N184 Chronic kidney disease, stage 4 (severe): Secondary | ICD-10-CM

## 2019-12-18 DIAGNOSIS — I5032 Chronic diastolic (congestive) heart failure: Secondary | ICD-10-CM

## 2019-12-18 DIAGNOSIS — Z952 Presence of prosthetic heart valve: Secondary | ICD-10-CM

## 2019-12-18 DIAGNOSIS — I350 Nonrheumatic aortic (valve) stenosis: Secondary | ICD-10-CM

## 2019-12-18 NOTE — Patient Instructions (Signed)
Medication Instructions:  Your physician recommends that you continue on your current medications as directed. Please refer to the Current Medication list given to you today.  *If you need a refill on your cardiac medications before your next appointment, please call your pharmacy*  Lab Work: None ordered today  Testing/Procedures: Your physician has requested that you have an echocardiogram on the same day you see Dr. Burt Knack in 6 months. Echocardiography is a painless test that uses sound waves to create images of your heart. It provides your doctor with information about the size and shape of your heart and how well your heart's chambers and valves are working. This procedure takes approximately one hour. There are no restrictions for this procedure.  Follow-Up: At [facility], you and your health needs are our priority.  As part of our continuing mission to provide you with exceptional heart care, we have created designated Provider Care Teams.  These Care Teams include your primary Cardiologist (physician) and Advanced Practice Providers (APPs -  Physician Assistants and Nurse Practitioners) who all work together to provide you with the care you need, when you need it.  Your next appointment:   6 month(s)  The format for your next appointment:   In Person  Provider:   [name], MD

## 2020-02-13 ENCOUNTER — Telehealth: Payer: Self-pay | Admitting: Cardiovascular Disease

## 2020-02-13 NOTE — Telephone Encounter (Signed)
New message:     Amy calling from [facility] concering this patient. They need last EKG and last office notes. Please fax [phone]

## 2020-06-18 ENCOUNTER — Ambulatory Visit (HOSPITAL_COMMUNITY): Payer: Medicare PPO | Attending: Cardiology

## 2020-06-18 ENCOUNTER — Other Ambulatory Visit: Payer: Self-pay

## 2020-06-18 ENCOUNTER — Encounter: Payer: Self-pay | Admitting: Cardiovascular Disease

## 2020-06-18 ENCOUNTER — Ambulatory Visit: Payer: Medicare PPO | Admitting: Cardiovascular Disease

## 2020-06-18 VITALS — BP 110/60 | HR 93 | Ht <= 58 in | Wt 100.6 lb

## 2020-06-18 DIAGNOSIS — Z952 Presence of prosthetic heart valve: Secondary | ICD-10-CM | POA: Diagnosis present

## 2020-06-18 DIAGNOSIS — I350 Nonrheumatic aortic (valve) stenosis: Secondary | ICD-10-CM | POA: Insufficient documentation

## 2020-06-18 DIAGNOSIS — E782 Mixed hyperlipidemia: Secondary | ICD-10-CM

## 2020-06-18 DIAGNOSIS — I050 Rheumatic mitral stenosis: Secondary | ICD-10-CM | POA: Diagnosis not present

## 2020-06-18 DIAGNOSIS — I5032 Chronic diastolic (congestive) heart failure: Secondary | ICD-10-CM

## 2020-06-18 LAB — ECHOCARDIOGRAM COMPLETE
AR max vel: 0.86 cm2
AV Area VTI: 0.77 cm2
AV Area mean vel: 0.81 cm2
AV Mean grad: 10 mmHg
AV Peak grad: 18 mmHg
Ao pk vel: 2.12 m/s
Area-P 1/2: 2.95 cm2
MV VTI: 0.59 cm2
S' Lateral: 2.8 cm

## 2020-06-18 NOTE — Progress Notes (Unsigned)
Cardiology Office Note:    Date:  06/19/2020   ID:  Sonya Carpenter, DOB 07-18-1945, MRN [id number]  PCP:  Addis, Daniel, [facility]  Cardiologist:  [name], MD  Advanced Practice Provider:  No care team member to display Electrophysiologist:  None       Referring MD: Michell Heinrich, DO   Chief Complaint  Patient presents with  . Aortic Stenosis    History of Present Illness:    Sonya Carpenter is a 75 y.o. female with a hx of:  Coronary artery disease   Diastolic CHF ? Prior patient of CHF Clinic ([place]) - graduated in 07/2017   Valvular Heart Disease ? Mitral stenosis ? Aortic stenosis  S/p TAVR in 03/2017   Hx of GI bleeding   Chronic kidney disease stage 5  Diabetes mellitus   Hypertension   Asthma/COPD  Anemia   Gallbladder disease  The patient was last seen by Richardson Dopp in August 2021.  She is here alone today.  She is very hard of hearing which makes things difficult.  She denies chest pain or shortness of breath.  She tells me that she has progressed to stage V chronic kidney disease and she has now undergone placement of a left AV fistula.  She has not started hemodialysis yet.  She sees her nephrologist back in follow-up next week.  States that she is in need of an iron infusion.  Complains of pretty significant leg weakness and marked fatigue.  No orthopnea or PND.  She does have some degree of leg swelling but no recent change in this.   Past Medical History:  Diagnosis Date  . Anemia   . Aortic stenosis   . Arthritis    back (04/14/2017)  . Asthma   . CAD (coronary artery disease)   . Cholecystitis 03/09/2017  . Chronic bronchitis ([place])   . Chronic diastolic congestive heart failure ([place])   . CKD (chronic kidney disease), stage IV ([place])   . COPD (chronic obstructive pulmonary disease) ([place])   . Diabetic retinopathy ([place]) 11/24/2016  . Gallbladder disease   . GERD (gastroesophageal reflux disease)   .  Heart murmur 07/15/2015  . History of blood transfusion "several times"   "low HgB"  . History of hiatal hernia   . Hyperlipidemia   . Hypertension   . Mitral stenosis    severe by echo in 05/2020 // not a candidate for MV intervention   . 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 Type II diabetes mellitus ([place])   . Vitamin D deficiency     Past Surgical History:  Procedure Laterality Date  . BREAST CYST EXCISION Right   . CARDIAC CATHETERIZATION    . CATARACT EXTRACTION W/ INTRAOCULAR LENS  IMPLANT, BILATERAL Bilateral   . COLONSCOPY    . ESOPHAGOGASTRODUODENOSCOPY N/A 04/01/2017   Procedure: ESOPHAGOGASTRODUODENOSCOPY (EGD);  Surgeon: Milus Banister, MD;  Location: [facility] ENDOSCOPY;  Service: Endoscopy;  Laterality: N/A;  . FRACTURE SURGERY    . MULTIPLE EXTRACTIONS WITH ALVEOLOPLASTY N/A 03/14/2017   Procedure: Extraction of tooth #'s 2-11, 15, 19-28 and 31 with alveoloplasty, bilateral mandibular tori reductions and bilateral mandibular  lingual exostoses reductions.;  Surgeon: Lenn Cal, DDS;  Location: [facility];  Service: Oral Surgery;  Laterality: N/A;  . PATELLA FRACTURE SURGERY Right   . RETINAL LASER PROCEDURE Bilateral   . RIGHT/LEFT HEART CATH AND CORONARY ANGIOGRAPHY N/A 02/09/2017   Procedure: RIGHT/LEFT HEART CATH AND CORONARY ANGIOGRAPHY;  Surgeon: Burnell Blanks, MD;  Location: [facility] CV [facility];  Service: Cardiovascular;  Laterality: N/A;  . TEE WITHOUT CARDIOVERSION N/A 03/29/2017   Procedure: TRANSESOPHAGEAL ECHOCARDIOGRAM (TEE);  Surgeon: [name], MD;  Location: [facility];  Service: Open Heart Surgery;  Laterality: N/A;  .  TONSILLECTOMY    . TRANSCATHETER AORTIC VALVE REPLACEMENT, TRANSFEMORAL N/A 03/29/2017   Procedure: TRANSCATHETER AORTIC VALVE REPLACEMENT, TRANSFEMORAL;  Surgeon: [name], MD;  Location: [facility];  Service: Open Heart Surgery;  Laterality: N/A;    Current Medications: Current Meds  Medication Sig  . ACCU-CHEK AVIVA PLUS test strip 3 each by Other route daily.  . Accu-Chek Softclix Lancets lancets   . acetaminophen (TYLENOL) 500 MG tablet Take 500 mg by mouth every 6 (six) hours as needed for mild pain or moderate pain.  Marland Kitchen albuterol (VENTOLIN HFA) 108 (90 Base) MCG/ACT inhaler Inhale 2 puffs into the lungs 4 (four) times daily as needed.  Marland Kitchen aspirin EC 81 MG EC tablet Take 1 tablet (81 mg total) by mouth daily.  Marland Kitchen atorvastatin (LIPITOR) 20 MG tablet Take 20 mg by mouth at bedtime.   Marland Kitchen azelastine (ASTELIN) 0.1 % nasal spray Place 2 sprays into both nostrils 2 (two) times daily. Use in each nostril as directed  . Benralizumab (FASENRA) 30 MG/ML SOSY Inject into the skin.  . Blood Glucose Monitoring Suppl (ACCU-CHEK AVIVA PLUS) w/Device KIT 1 Device by Other route as needed.  . docusate sodium (COLACE) 100 MG capsule Take 100 mg by mouth 2 (two) times daily.  . DROPLET PEN NEEDLES 31G X 8 MM MISC 1 each by Other route as needed.  . fluticasone (VERAMYST) 27.5 MCG/SPRAY nasal spray Place 1 spray into the nose in the morning and at bedtime.  . Fluticasone-Umeclidin-Vilant (TRELEGY ELLIPTA) 100-62.5-25 MCG/INH AEPB Take by mouth.  . furosemide (LASIX) 20 MG tablet Take 20 mg by mouth every other day.  . insulin glargine, 1 Unit Dial, (TOUJEO SOLOSTAR) 300 UNIT/ML Solostar Pen Inject 25 Units into the skin daily.  . montelukast (SINGULAIR) 10 MG tablet Take 10 mg by mouth at bedtime.  . multivitamin (RENA-VIT) TABS tablet Take 1 tablet by mouth daily.  . pantoprazole (PROTONIX) 40 MG tablet Take 40 mg by mouth daily.  Marland Kitchen Propylene Glycol-Glycerin 1-0.3 % SOLN Place 2 drops into both eyes daily  as needed (for dry eyes).  . sevelamer carbonate (RENVELA) 800 MG tablet Take 1,600 mg by mouth in the morning and at bedtime.  . sodium bicarbonate 650 MG tablet Take 1 tablet by mouth in the morning and at bedtime.     Allergies:   Patient has no known allergies.   Social History   Socioeconomic History  . Marital status: Widowed    Spouse name: Not on file  . Number of children: 0  . Years of education: Not on file  . Highest education level: Not on file  Occupational History  . 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 Smoking status: Passive Smoke Exposure - Never Smoker  . Smokeless tobacco:  Never Used  Vaping Use  . Vaping Use: Never used  Substance and Sexual Activity  . Alcohol use: No  . Drug use: No  . Sexual activity: Never  Other Topics Concern  . Not on file  Social History Narrative   Here with Uncle and [place].   Social Determinants of Health   Financial Resource Strain: Not on file  Food Insecurity: Not on file  Transportation Needs: Not on file  Physical Activity: Not on file  Stress: Not on file  Social Connections: Not on file     Family History: The patient's family history includes Cancer in her sister; Diabetes in her father; Heart disease in her father and mother; Hypertension in her father and mother; Kidney failure in her mother.  ROS:   Please see the history of present illness.    All other systems reviewed and are negative.  EKGs/Labs/Other Studies Reviewed:    The following studies were reviewed today: Echo from today personally reviewed.   IMPRESSIONS    1. The mitral valve leaflets are severely thickened and calcified. There  is severe MAC.  2. There is severe, calcific mitral stenosis with MVA by continuity of  0.92 and mean gradient 58mHg at HR 78.  3. A 260mEdwards Sapien TAVR valve is present  and appears well seated.  Mean gradient 1037m, peak gradient 22m34m DI 0.37. Trivial paravalvular  leak.  4. Left ventricular ejection fraction, by estimation, is 60 to 65%. The  left ventricle has normal function. The left ventricle has no regional  wall motion abnormalities. There is mild concentric left ventricular  hypertrophy.  5. Diastolic funciton indeterminant due to severe MAC.  6. Right ventricular systolic function is normal. The right ventricular  size is normal.  7. Left atrial size was severely dilated.  8. The inferior vena cava is dilated in size with >50% respiratory  variability, suggesting right atrial pressure of 8 mmHg.   Comparison(s): No significant change from prior study.  EKG:  EKG is not ordered today.    Recent Labs: No results found for requested labs within last 8760 hours.  Recent Lipid Panel No results found for: CHOL, TRIG, HDL, CHOLHDL, VLDL, LDLCALC, LDLDIRECT   Risk Assessment/Calculations:       Physical Exam:    VS:  BP 110/60   Pulse 93   Ht _0  (1.473 m)   Wt 100 lb 9.6 oz (45.6 kg)   SpO2 98%   BMI 21.03 kg/m     Wt Readings from Last 3 Encounters:  06/18/20 100 lb 9.6 oz (45.6 kg)  12/18/19 96 lb (43.5 kg)  06/06/19 101 lb 6.4 oz (46 kg)     GEN: Elderly appearing woman in no acute distress HEENT: Normal NECK: No JVD; No carotid bruits LYMPHATICS: No lymphadenopathy CARDIAC: RRR, grade 1/6 systolic murmur at the left lower sternal border RESPIRATORY:  Clear to auscultation without rales, wheezing or rhonchi  ABDOMEN: Soft, non-tender, non-distended MUSCULOSKELETAL: Trace bilateral ankle edema; No deformity  SKIN: Warm and dry NEUROLOGIC:  Alert and oriented x 3 PSYCHIATRIC:  Normal affect   ASSESSMENT:    1. S/P TAVR (transcatheter aortic valve replacement)   2. Mitral valve stenosis, unspecified etiology   3. Chronic diastolic congestive heart failure (HCC)[place]4. Mixed hyperlipidemia    PLAN:    In  order of problems listed above:  1. Patient doing well.  I reviewed her echo today.  Her TAVR bioprosthesis appears to be functioning  normally with peak and mean gradients of 10 and 18 mmHg, respectively.  The dimensionless index is 0.43.  I do not appreciate any valvular or paravalvular regurgitation. 2. The patient has diffuse thickening and calcification of the mitral valve on review of her surface echo today.  The mean gradient is measured as high as 12 mmHg suggesting severe mitral stenosis.  Continue with medical therapy.  I do not think she is a candidate for any type of invasive valve intervention at this point.  She has become increasingly frail and is approaching the need for long-term hemodialysis. 3. Stable, no significant dyspnea.  Primary limitation is generalized weakness.  Taking 20 mg of furosemide every other day at present. 4. Treated with a statin drug (atorvastatin 20 mg daily) lipids followed by her primary physician.  Medication Adjustments/Labs and Tests Ordered: Current medicines are reviewed at length with the patient today.  Concerns regarding medicines are outlined above.  No orders of the defined types were placed in this encounter.  No orders of the defined types were placed in this encounter.   Patient Instructions  Medication Instructions:  Your provider recommends that you continue on your current medications as directed. Please refer to the Current Medication list given to you today.   *If you need a refill on your cardiac medications before your next appointment, please call your pharmacy*   Follow-Up: At [facility], you and your health needs are our priority.  As part of our continuing mission to provide you with exceptional heart care, we have created designated Provider Care Teams.  These Care Teams include your primary Cardiologist (physician) and Advanced Practice Providers (APPs -  Physician Assistants and Nurse Practitioners) who all work together to  provide you with the care you need, when you need it. Your next appointment:   6 month(s) The format for your next appointment:   In Person Provider:   Richardson Dopp, PA-C     Signed, [name], MD  06/19/2020 5:29 AM    [facility]

## 2020-06-18 NOTE — Patient Instructions (Signed)
Medication Instructions:  Your provider recommends that you continue on your current medications as directed. Please refer to the Current Medication list given to you today.   *If you need a refill on your cardiac medications before your next appointment, please call your pharmacy*  Follow-Up: At [facility], you and your health needs are our priority.  As part of our continuing mission to provide you with exceptional heart care, we have created designated Provider Care Teams.  These Care Teams include your primary Cardiologist (physician) and Advanced Practice Providers (APPs -  Physician Assistants and Nurse Practitioners) who all work together to provide you with the care you need, when you need it. Your next appointment:   6 month(s) The format for your next appointment:   In Person Provider:   Scott Weaver, PA-C 

## 2021-02-17 ENCOUNTER — Ambulatory Visit: Payer: Medicare PPO | Admitting: Physician Assistant

## 2021-08-24 DEATH — deceased
